# Patient Record
Sex: Female | Born: 1985 | Race: Black or African American | Hispanic: No | Marital: Single | Smoking: Former smoker
Health system: Southern US, Community
[De-identification: ages and names within clinical notes are randomized; demographics above are authoritative.]

## PROBLEM LIST (undated history)

## (undated) DIAGNOSIS — K589 Irritable bowel syndrome without diarrhea: Secondary | ICD-10-CM

## (undated) DIAGNOSIS — D649 Anemia, unspecified: Secondary | ICD-10-CM

## (undated) DIAGNOSIS — I10 Essential (primary) hypertension: Secondary | ICD-10-CM

## (undated) DIAGNOSIS — E119 Type 2 diabetes mellitus without complications: Secondary | ICD-10-CM

## (undated) DIAGNOSIS — O139 Gestational [pregnancy-induced] hypertension without significant proteinuria, unspecified trimester: Secondary | ICD-10-CM

## (undated) DIAGNOSIS — E7800 Pure hypercholesterolemia, unspecified: Secondary | ICD-10-CM

## (undated) DIAGNOSIS — M2550 Pain in unspecified joint: Secondary | ICD-10-CM

## (undated) DIAGNOSIS — R609 Edema, unspecified: Secondary | ICD-10-CM

## (undated) DIAGNOSIS — O10919 Unspecified pre-existing hypertension complicating pregnancy, unspecified trimester: Secondary | ICD-10-CM

## (undated) HISTORY — DX: Pain in unspecified joint: M25.50

## (undated) HISTORY — DX: Irritable bowel syndrome, unspecified: K58.9

## (undated) HISTORY — DX: Type 2 diabetes mellitus without complications: E11.9

## (undated) HISTORY — DX: Pure hypercholesterolemia, unspecified: E78.00

## (undated) HISTORY — DX: Edema, unspecified: R60.9

## (undated) HISTORY — DX: Unspecified pre-existing hypertension complicating pregnancy, unspecified trimester: O10.919

---
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
## 2005-08-22 ENCOUNTER — Inpatient Hospital Stay (HOSPITAL_COMMUNITY): Admission: AD | Admit: 2005-08-22 | Discharge: 2005-08-25 | Payer: Self-pay | Admitting: Obstetrics

## 2006-09-02 ENCOUNTER — Emergency Department (HOSPITAL_COMMUNITY): Admission: EM | Admit: 2006-09-02 | Discharge: 2006-09-02 | Payer: Self-pay | Admitting: Emergency Medicine

## 2007-02-11 ENCOUNTER — Emergency Department (HOSPITAL_COMMUNITY): Admission: EM | Admit: 2007-02-11 | Discharge: 2007-02-11 | Payer: Self-pay | Admitting: Emergency Medicine

## 2007-02-22 ENCOUNTER — Emergency Department (HOSPITAL_COMMUNITY): Admission: EM | Admit: 2007-02-22 | Discharge: 2007-02-22 | Payer: Self-pay | Admitting: Family Medicine

## 2007-11-02 ENCOUNTER — Inpatient Hospital Stay (HOSPITAL_COMMUNITY): Admission: AD | Admit: 2007-11-02 | Discharge: 2007-11-02 | Payer: Self-pay | Admitting: Obstetrics

## 2007-12-21 ENCOUNTER — Inpatient Hospital Stay (HOSPITAL_COMMUNITY): Admission: RE | Admit: 2007-12-21 | Discharge: 2007-12-23 | Payer: Self-pay | Admitting: Obstetrics

## 2008-04-20 ENCOUNTER — Emergency Department (HOSPITAL_COMMUNITY): Admission: EM | Admit: 2008-04-20 | Discharge: 2008-04-20 | Payer: Self-pay | Admitting: Emergency Medicine

## 2009-01-09 ENCOUNTER — Emergency Department (HOSPITAL_COMMUNITY): Admission: EM | Admit: 2009-01-09 | Discharge: 2009-01-09 | Payer: Self-pay | Admitting: Emergency Medicine

## 2009-10-02 ENCOUNTER — Emergency Department (HOSPITAL_COMMUNITY): Admission: EM | Admit: 2009-10-02 | Discharge: 2009-10-02 | Payer: Self-pay | Admitting: Emergency Medicine

## 2010-01-25 ENCOUNTER — Emergency Department (HOSPITAL_COMMUNITY): Admission: EM | Admit: 2010-01-25 | Discharge: 2010-01-26 | Payer: Self-pay | Admitting: Emergency Medicine

## 2010-05-02 ENCOUNTER — Emergency Department (HOSPITAL_COMMUNITY): Admission: EM | Admit: 2010-05-02 | Discharge: 2010-05-02 | Payer: Self-pay | Admitting: Family Medicine

## 2010-09-30 ENCOUNTER — Emergency Department (HOSPITAL_COMMUNITY)
Admission: EM | Admit: 2010-09-30 | Discharge: 2010-09-30 | Disposition: A | Discharge status: Home / Self Care | Payer: Self-pay | Attending: Emergency Medicine | Admitting: Emergency Medicine

## 2010-09-30 DIAGNOSIS — R110 Nausea: Secondary | ICD-10-CM | POA: Insufficient documentation

## 2010-09-30 DIAGNOSIS — K5289 Other specified noninfective gastroenteritis and colitis: Secondary | ICD-10-CM | POA: Insufficient documentation

## 2010-09-30 DIAGNOSIS — R197 Diarrhea, unspecified: Secondary | ICD-10-CM | POA: Insufficient documentation

## 2010-09-30 DIAGNOSIS — E669 Obesity, unspecified: Secondary | ICD-10-CM | POA: Insufficient documentation

## 2010-09-30 LAB — URINALYSIS, ROUTINE W REFLEX MICROSCOPIC
Protein, ur: 30 mg/dL — AB
Specific Gravity, Urine: 1.028 (ref 1.005–1.030)
Urine Glucose, Fasting: NEGATIVE mg/dL

## 2010-09-30 LAB — URINE MICROSCOPIC-ADD ON

## 2011-01-10 NOTE — Discharge Summary (Signed)
   [name]                          ACCOUNT NO.:  [id number]   MEDICAL RECORD NO.:  [id number]                   PATIENT TYPE:  INP   LOCATION:  9123                                 F[facility]ILITY:  [facility]   PHYSICIAN:  Burnadette Peter, M.D.             DATE OF BIRTH:  11/10/1985   DATE OF ADMISSION:  03/06/2003  DATE OF DISCHARGE:  03/08/2003                                 DISCHARGE SUMMARY   DISCHARGE DIAGNOSIS:  Pelvic inflammatory disease, resolving.   DISCHARGE MEDICATIONS:  1. Metronidazole 500 mg every 12 hours for 14 days.  2. Doxycycline 100 mg one tablet every 12 hours for 14 days.   FOLLOW UP:  GYN Clinic after three weeks.   HISTORY OF PRESENT ILLNESS:  This is a 25 year old G0 who presented with a  three day history of abdominal pain and __________.  The patient also had  classical __________   PHYSICAL EXAMINATION:  VITAL SIGNS:  Blood pressure 137/68, pulse rate  __________, respiratory rate __________, temperature 99.2.  LUNGS:  Lower lobe rales __________.  ABDOMEN:  Soft with no masses.  There was noted tenderness over the lower  quadrant.  No rebound __________.  PELVIC:  Moderate amount of white discharge.  Cervix tender on lateral  motion.  The uterus was tender to motion.  Adnexa bilateral mild tenderness.   ADMISSION DIAGNOSES:  1. Probable pelvic inflammatory disease.  2. Rule out pneumonia.   LABORATORY DATA:  __________   Chest x-ray normal.   __________ every 12 hours __________.  The patient was also consulted  regarding prevention of another episode of PID and was advised to think on  possible diaphragm contraception.     Anna Sabal, MD                         Burnadette Peter, M.D.    MC/MEDQ  D:  03/08/2003  T:  03/09/2003  Job:  [id number]

## 2011-01-10 NOTE — Discharge Summary (Signed)
[name], [name]                          ACCOUNT NO.:  [id number]   MEDICAL RECORD NO.:  [id number]                   PATIENT TYPE:  INP   LOCATION:  9123                                 [facility]  [facility]   PHYSICIAN:  Burnadette Peter, M.D.             DATE OF BIRTH:  [birth year]   DATE OF ADMISSION:  03/06/2003  DATE OF DISCHARGE:  03/08/2003                                 DISCHARGE SUMMARY   DISCHARGE DIAGNOSIS:  Pelvic inflammatory disease.   DISCHARGE MEDICATIONS:  1. Metronidazole 500 mg every 12 hours for 14 days.  2. Doxycycline 100 mg every 12 hours for 14 days.   FOLLOWUP DATE:  The patient will follow up at the GYN clinic after three  weeks.   ADMITTING HISTORY AND PHYSICAL EXAMINATION:  The patient is a 25 year old G0  with a 3 day history of abdominal pain.  No symptoms of nausea, vomiting,  constipation, diarrhea or anorexia.  The patient also had a low-grade fever.  The patient complained of a chronic cough for which she has used p.r.n.  cough medications.   PAST HISTORY:  Patient is newly gravid.   GYNECOLOGIC HISTORY:  The patient has regular menstrual cycles.  Last normal  menstrual period was February 21, 2003.   MEDICAL HISTORY:  Unremarkable.   SURGICAL HISTORY:  Unremarkable.   SOCIAL HISTORY:  The patient smokes one pack per day and admits to marijuana  use.   PHYSICAL EXAMINATION:  VITAL SIGNS:  On admission, blood pressure 137/68,  heart rate 93, respiratory rate 20, temperature 99.2.  T-max in [place]  ER was 100.2.  HEENT:  Unremarkable.  LUNGS:  Clear to auscultation.  There was auscultation of rales at the lower  left lobe.  CVS:  Regular, rate and rhythm.  No murmurs.  ABDOMEN:  Soft with no masses.  There was tenderness in both lower  quadrants.  No rebound tenderness.  PELVIC:  External genitalia - normal.  Vagina - moderate amount of white  discharge.  Cervix - tender with lateral motion.  Uterus - normal size with  tenderness on  motion.  Adnexa - bilateral fullness and mild tenderness.   ADMISSION LABORATORY:  Hemoglobin 11.4, WBC 29.4 with 93% polys, platelets  272,000.  Urinalysis protein 30, large blood, moderate leukocytes.  Glucose  88.  HCG negative.   CT consistent with bilateral tubal ovarian processes, can not rule out tubal  ovarian abscess.   ADMITTING DIAGNOSES:  1. Probable pelvic inflammatory disease.  2. Rule out pneumonia.   HOSPITAL COURSE:  The patient was started on Rocephin 1 gram IV every 24  hours and doxycycline 100 mg every 12 hours per R.N.  The patient continued  the antibiotics for 2 days with noted decreasing abdominal tenderness and  lysis of the fever.  On the second hospital day, the patient was sent home  with resolution of symptoms.  The patient will follow up at the GYN clinic after three weeks.     Anna Sabal, MD                         Burnadette Peter, M.D.    MC/MEDQ  D:  03/21/2003  T:  03/21/2003  Job:  [id number]

## 2011-05-19 LAB — URINALYSIS, ROUTINE W REFLEX MICROSCOPIC
Bilirubin Urine: NEGATIVE
Ketones, ur: NEGATIVE
Nitrite: NEGATIVE
Protein, ur: NEGATIVE
Urobilinogen, UA: 0.2

## 2011-05-20 LAB — CBC
HCT: 30.2 — ABNORMAL LOW
HCT: 34.7 — ABNORMAL LOW
Hemoglobin: 10.2 — ABNORMAL LOW
MCHC: 33.9
MCV: 91.3
RBC: 3.8 — ABNORMAL LOW
RDW: 13.3
WBC: 6.5

## 2011-12-05 ENCOUNTER — Encounter (HOSPITAL_COMMUNITY): Payer: Self-pay | Admitting: *Deleted

## 2011-12-05 ENCOUNTER — Emergency Department (HOSPITAL_COMMUNITY)
Admission: EM | Admit: 2011-12-05 | Discharge: 2011-12-05 | Disposition: A | Discharge status: Home / Self Care | Payer: Self-pay | Attending: Emergency Medicine | Admitting: Emergency Medicine

## 2011-12-05 DIAGNOSIS — N63 Unspecified lump in unspecified breast: Secondary | ICD-10-CM | POA: Insufficient documentation

## 2011-12-05 DIAGNOSIS — I10 Essential (primary) hypertension: Secondary | ICD-10-CM | POA: Insufficient documentation

## 2011-12-05 DIAGNOSIS — F17200 Nicotine dependence, unspecified, uncomplicated: Secondary | ICD-10-CM | POA: Insufficient documentation

## 2011-12-05 HISTORY — DX: Essential (primary) hypertension: I10

## 2011-12-05 NOTE — Discharge Instructions (Signed)
Your mass needs further evaluation. Try calling both surgery and breast center as referred on Monday. Return if enlarging in size and unable to follow up.

## 2011-12-05 NOTE — ED Notes (Signed)
The pt is  C/o a knot on her lt breast for 4 days.  No known injury

## 2011-12-05 NOTE — ED Notes (Signed)
States she has pain with palpation. No change in size noted

## 2011-12-05 NOTE — ED Provider Notes (Signed)
History     CSN: [id number]  Arrival date & time 12/05/11  1939   First MD Initiated Contact with Patient 12/05/11 2103      Chief Complaint  Patient presents with  . knot on  her lt breast     (Consider location/radiation/quality/duration/timing/severity/associated sxs/prior treatment) HPI Comments: Pt is a 26 yo female that presents with chief complaint of a "lump" to the breast. States noticed a firm lump in left breast about 3 days ago. It has not changed in size since then. It is not tender. There is no other nodules any where in her breasts. No nipple discharge. No doctor. Denies fever, chills, malaise.     The history is provided by the patient.    Past Medical History  Diagnosis Date  . Hypertension     History reviewed. No pertinent past surgical history.  No family history on file.  History  Substance Use Topics  . Smoking status: Current Everyday Smoker  . Smokeless tobacco: Not on file  . Alcohol Use: Yes    OB History    Grav Para Term Preterm Abortions TAB SAB Ect Mult Living                  Review of Systems  Constitutional: Negative for fever and chills.  Skin:       Breast nodule  All other systems reviewed and are negative.    Allergies  Review of patient's allergies indicates no known allergies.  Home Medications   Current Outpatient Rx  Name Route Sig Dispense Refill  . NYQUIL PO Oral Take 2 tablets by mouth at bedtime as needed. For insomnia      BP 132/78  Pulse 75  Temp(Src) 98.3 F (36.8 C) (Oral)  Resp 18  SpO2 99%  Physical Exam  Nursing note and vitals reviewed. Constitutional: She is oriented to person, place, and time. She appears well-developed and well-nourished. No distress.  HENT:  Head: Normocephalic.  Eyes: Conjunctivae are normal.  Neck: Neck supple.  Cardiovascular: Normal rate, regular rhythm and normal heart sounds.   Pulmonary/Chest: Effort normal and breath sounds normal. No respiratory distress.       Breasts are symmetrical bilaterally. There is a "peanut" shaped nodule in the left lateral breast, just anterior to the axilla. It is firm, smooth, mobile, non tender. NO axillary lymphadenopathy  Neurological: She is alert and oriented to person, place, and time.  Skin: Skin is warm and dry.  Psychiatric: She has a normal mood and affect.    ED Course  Procedures (including critical care time)  Left breast nodule. DDx abscess, cyst, benign or malignant mass. Doubt abscess since non tender, no redness, but instructed pt to return if growing in size fast and becomes tender. Will refer to surgery and breast center for further evaluation, i believe pt will need an ultrasound and possible biopsy.  1. Breast mass in female       MDM          Lottie Mussel, [place] 12/05/11 2153

## 2011-12-06 NOTE — ED Provider Notes (Signed)
Medical screening examination/treatment/procedure(s) were performed by non-physician practitioner and as supervising physician I was immediately available for consultation/collaboration.  Flint Melter, MD 12/06/11 [phone]

## 2012-01-06 ENCOUNTER — Ambulatory Visit: Payer: Self-pay

## 2012-01-07 ENCOUNTER — Encounter: Payer: Self-pay | Admitting: Obstetrics and Gynecology

## 2013-10-16 ENCOUNTER — Encounter (HOSPITAL_COMMUNITY): Payer: Self-pay | Admitting: Emergency Medicine

## 2013-10-16 ENCOUNTER — Emergency Department (HOSPITAL_COMMUNITY)
Admission: EM | Admit: 2013-10-16 | Discharge: 2013-10-16 | Disposition: A | Discharge status: Home / Self Care | Payer: Self-pay | Attending: Emergency Medicine | Admitting: Emergency Medicine

## 2013-10-16 DIAGNOSIS — I10 Essential (primary) hypertension: Secondary | ICD-10-CM | POA: Insufficient documentation

## 2013-10-16 DIAGNOSIS — R0789 Other chest pain: Secondary | ICD-10-CM | POA: Insufficient documentation

## 2013-10-16 DIAGNOSIS — Z87891 Personal history of nicotine dependence: Secondary | ICD-10-CM | POA: Insufficient documentation

## 2013-10-16 DIAGNOSIS — J069 Acute upper respiratory infection, unspecified: Secondary | ICD-10-CM | POA: Insufficient documentation

## 2013-10-16 MED ORDER — IBUPROFEN 400 MG PO TABS
800.0000 mg | ORAL_TABLET | Freq: Once | ORAL | Status: AC
  Administered 2013-10-16: 800 mg via ORAL
  Filled 2013-10-16: qty 2

## 2013-10-16 MED ORDER — AZITHROMYCIN 250 MG PO TABS: 250.0000 mg | ORAL_TABLET | Freq: Every day | ORAL | Status: DC

## 2013-10-16 MED ORDER — ACETAMINOPHEN 325 MG PO TABS
650.0000 mg | ORAL_TABLET | Freq: Once | ORAL | Status: AC
  Administered 2013-10-16: 650 mg via ORAL
  Filled 2013-10-16: qty 2

## 2013-10-16 NOTE — ED Provider Notes (Signed)
Medical screening examination/treatment/procedure(s) were performed by non-physician practitioner and as supervising physician I was immediately available for consultation/collaboration.  EKG Interpretation   None       Devoria AlbeIva Cherysh Epperly, MD, Armando GangFACEP   Ward GivensIva L Lareen Mullings, MD 10/16/13 [phone]

## 2013-10-16 NOTE — Discharge Instructions (Signed)

## 2013-10-16 NOTE — ED Provider Notes (Signed)
CSN: [id number]     Arrival date & time 10/16/13  1914 History  This chart was scribed for non-physician practitioner Marlon Pel working with Ward Givens, MD by Carl Best, ED Scribe. This patient was seen in room TR05C/TR05C and the patient's care was started at 7:56 PM.     Chief Complaint  Patient presents with  . URI     Patient is a 28 y.o. female presenting with URI. The history is provided by the patient. No language interpreter was used.  URI Presenting symptoms: cough, rhinorrhea and sore throat   Presenting symptoms: no ear pain and no fever   Associated symptoms: headaches   Associated symptoms: no myalgias    HPI Comments: [name] is a 28 y.o. female who presents to the Emergency Department complaining of constant cough, rhinorrhea, sore throat, and headache that started 11 days ago after the patient got off work.  The patient denies ear pain, fever, nausea, vomiting, myalgias, and diarrhea as associated symptoms.  She states that she will experience chest pain when coughing.  The patient states that she has tried Tylenol a week ago with no relief to her headaches.  The patient denies having a history of Asthma, Hypertension, and DM.  She states that she is normally healthy otherwise.     Past Medical History  Diagnosis Date  . Hypertension    History reviewed. No pertinent past surgical history. No family history on file. History  Substance Use Topics  . Smoking status: Former Smoker    Quit date: 03/29/2013  . Smokeless tobacco: Not on file  . Alcohol Use: Yes   OB History   Grav Para Term Preterm Abortions TAB SAB Ect Mult Living                 Review of Systems  Constitutional: Negative for fever.  HENT: Positive for rhinorrhea and sore throat. Negative for ear pain.   Respiratory: Positive for cough.   Cardiovascular: Positive for chest pain (when coughing).  Gastrointestinal: Negative for nausea, vomiting and diarrhea.  Musculoskeletal:  Negative for myalgias.  Neurological: Positive for headaches.  All other systems reviewed and are negative.      Allergies  Review of patient's allergies indicates no known allergies.  Home Medications   Current Outpatient Rx  Name  Route  Sig  Dispense  Refill  . Pseudoeph-Doxylamine-DM-APAP (NYQUIL PO)   Oral   Take 2 tablets by mouth at bedtime as needed. For insomnia         . azithromycin (ZITHROMAX) 250 MG tablet   Oral   Take 1 tablet (250 mg total) by mouth daily. Take first 2 tablets together, then 1 every day until finished.   6 tablet   0    Triage Vitals: BP 135/80  Pulse 97  Temp(Src) 97.4 F (36.3 C) (Oral)  Resp 15  Ht 5\' 5"  (1.651 m)  Wt 205 lb (92.987 kg)  BMI 34.11 kg/m2  SpO2 97%  LMP 09/06/2013  Physical Exam  Nursing note and vitals reviewed. Constitutional: She is oriented to person, place, and time. She appears well-developed and well-nourished.  HENT:  Head: Normocephalic and atraumatic.  Eyes: EOM are normal.  Neck: Normal range of motion.  Cardiovascular: Normal rate.   Pulmonary/Chest: Effort normal. No respiratory distress. She has no wheezes.  Musculoskeletal: Normal range of motion.  Neurological: She is alert and oriented to person, place, and time.  Skin: Skin is warm and dry.  Psychiatric:  She has a normal mood and affect. Her behavior is normal.    ED Course  Procedures (including critical care time)  DIAGNOSTIC STUDIES: Oxygen Saturation is 97% on room air, adequate by my interpretation.    COORDINATION OF CARE: 7:59 PM- Discussed discharging the patient with a prescription for antibiotics and a note excusing the patient from work until Thursday.  Advised the patient to alternate between Ibuprofen and Tylenol to treat her headache.  The patient agreed to the treatment plan.     Labs Review Labs Reviewed - No data to display Imaging Review No results found.  EKG Interpretation   None       MDM   Final  diagnoses:  URI (upper respiratory infection)    27 y.o.[name]'s evaluation in the Emergency Department is complete. It has been determined that no acute conditions requiring further emergency intervention are present at this time. The patient/guardian have been advised of the diagnosis and plan. We have discussed signs and symptoms that warrant return to the ED, such as changes or worsening in symptoms.  Vital signs are stable at discharge. Filed Vitals:   10/16/13 1919  BP: 135/80  Pulse: 97  Temp: 97.4 F (36.3 C)  Resp: 15    Patient/guardian has voiced understanding and agreed to follow-up with the PCP or specialist.  I personally performed the services described in this documentation, which was scribed in my presence. The recorded information has been reviewed and is accurate.    Dorthula Matasiffany G Azaryah Heathcock, PA-C 10/16/13 2018

## 2013-10-16 NOTE — ED Notes (Signed)
C/o cold sx, onset Thursday, mentions cough, HA, nasal & chest, congestion. HA 10/10. No relief with nyquil. Mentions needing a doctors note for work.

## 2014-10-01 ENCOUNTER — Encounter (HOSPITAL_COMMUNITY): Payer: Self-pay | Admitting: *Deleted

## 2014-10-01 ENCOUNTER — Emergency Department (INDEPENDENT_AMBULATORY_CARE_PROVIDER_SITE_OTHER)
Admission: EM | Admit: 2014-10-01 | Discharge: 2014-10-01 | Disposition: A | Discharge status: Home / Self Care | Payer: Self-pay | Source: Home / Self Care | Attending: Emergency Medicine | Admitting: Emergency Medicine

## 2014-10-01 DIAGNOSIS — I10 Essential (primary) hypertension: Secondary | ICD-10-CM

## 2014-10-01 LAB — POCT I-STAT, CHEM 8
BUN: 14 mg/dL (ref 6–23)
CALCIUM ION: 1.14 mmol/L (ref 1.12–1.23)
Chloride: 102 mmol/L (ref 96–112)
Creatinine, Ser: 0.8 mg/dL (ref 0.50–1.10)
Glucose, Bld: 85 mg/dL (ref 70–99)
HCT: 37 % (ref 36.0–46.0)
Hemoglobin: 12.6 g/dL (ref 12.0–15.0)
Potassium: 3.7 mmol/L (ref 3.5–5.1)
SODIUM: 141 mmol/L (ref 135–145)
TCO2: 23 mmol/L (ref 0–100)

## 2014-10-01 MED ORDER — AMLODIPINE BESYLATE 10 MG PO TABS: 10.0000 mg | ORAL_TABLET | Freq: Every day | ORAL | Status: DC

## 2014-10-01 NOTE — ED Provider Notes (Signed)
CSN: [id number]     Arrival date & time 10/01/14  1522 [id number]   First MD Initiated Contact with Patient 10/01/14 1528     Chief Complaint  Patient presents with  . Hypertension   (Consider location/radiation/quality/duration/timing/severity/associated sxs/prior Treatment) HPI She is a 29 year old woman here for evaluation of elevated blood pressures. She states 9 years ago she had pregnancy-induced hypertension, but has not had any problems since then. She has been taking her blood pressure at home and it has been in the 140s over 90s consistently. She denies any chest pain or shortness of breath. She does describe some intermittent headaches and dizziness as well as some intermittent spots in her vision.  Past Medical History  Diagnosis Date  . Hypertension    History reviewed. No pertinent past surgical history. History reviewed. No pertinent family history. History  Substance Use Topics  . Smoking status: Former Smoker    Quit date: 03/29/2013  . Smokeless tobacco: Not on file  . Alcohol Use: Yes   OB History    No data available     Review of Systems  Eyes: Positive for visual disturbance.  Respiratory: Negative for shortness of breath.   Cardiovascular: Negative for chest pain.  Neurological: Positive for dizziness and headaches.    Allergies  Review of patient's allergies indicates no known allergies.  Home Medications   Prior to Admission medications   Medication Sig Start Date End Date Taking? Authorizing Provider  amLODipine (NORVASC) 10 MG tablet Take 1 tablet (10 mg total) by mouth daily. 10/01/14   Charm RingsErin J Honig, MD  azithromycin (ZITHROMAX) 250 MG tablet Take 1 tablet (250 mg total) by mouth daily. Take first 2 tablets together, then 1 every day until finished. 10/16/13   Tiffany Irine SealG Greene, PA-C  Pseudoeph-Doxylamine-DM-APAP (NYQUIL PO) Take 2 tablets by mouth at bedtime as needed. For insomnia    Historical Provider, MD   BP 144/98 mmHg  Pulse 77  Temp(Src)  98.5 F (36.9 C) (Oral)  Resp 18  SpO2 98%  LMP 09/29/2014 Physical Exam  Constitutional: She is oriented to person, place, and time. She appears well-developed and well-nourished. No distress.  Cardiovascular: Normal rate, regular rhythm and normal heart sounds.   No murmur heard. Pulmonary/Chest: Effort normal and breath sounds normal. No respiratory distress. She has no wheezes. She has no rales.  Neurological: She is alert and oriented to person, place, and time.    ED Course  Procedures (including critical care time) Labs Review Labs Reviewed  POCT I-STAT, CHEM 8    Imaging Review No results found.   MDM   1. Essential hypertension    I-STAT is normal. We'll start amlodipine 10 mg daily. She will monitor her blood pressure at home. Follow-up if it remains over 140/90. Reviewed reasons to go the ER as in after visit summary.    Charm RingsErin J Honig, MD 10/01/14 [phone]

## 2014-10-01 NOTE — ED Notes (Signed)
Pt     Reports          Her  Blood   Pressure       Was      High  sev  Weeks  Ago     And  She checked  It   Herself  At  Home      And it  Was  High  As  Well      She  denys    Any     Symptoms  Other  Than being  Dizzy

## 2014-10-01 NOTE — Discharge Instructions (Signed)
You have high blood pressure. Your blood work is normal. Start taking amlodipine 1 pill daily. Check your blood pressure at home over the next 2 weeks. As long as your numbers are less than 140/90, continue taking the amlodipine. If your blood pressure remains over 140/90, please come back. If you develop chest pain or severe headache that does not improve with Tylenol, please go to the emergency room.

## 2015-05-04 LAB — OB RESULTS CONSOLE ABO/RH: RH Type: POSITIVE

## 2015-05-04 LAB — OB RESULTS CONSOLE ANTIBODY SCREEN: ANTIBODY SCREEN: NEGATIVE

## 2015-05-04 LAB — OB RESULTS CONSOLE HIV ANTIBODY (ROUTINE TESTING): HIV: NONREACTIVE

## 2015-05-04 LAB — OB RESULTS CONSOLE GC/CHLAMYDIA
Chlamydia: NEGATIVE
GC PROBE AMP, GENITAL: NEGATIVE

## 2015-05-04 LAB — OB RESULTS CONSOLE RPR: RPR: NONREACTIVE

## 2015-05-04 LAB — OB RESULTS CONSOLE RUBELLA ANTIBODY, IGM: Rubella: IMMUNE

## 2015-05-04 LAB — OB RESULTS CONSOLE HEPATITIS B SURFACE ANTIGEN: Hepatitis B Surface Ag: NEGATIVE

## 2015-08-26 NOTE — L&D Delivery Note (Signed)
Delivery Note At 10:45 AM a viable female was delivered via Vaginal, Spontaneous Delivery (Presentation: Left Occiput Anterior).  APGAR: , ; weight  .   Placenta status: Intact, Spontaneous.  Cord: 3 vessels with the following complications: None.  Cord pH: not done  Anesthesia: Epidural  Episiotomy:   Lacerations: Labialleft labial tear repaired with one stitch Suture Repair: 2.0 vicryl Est. Blood Loss (mL): 150  Mom to postpartum.  Baby to Couplet care / Skin to Skin.  Rafaelita Foister A 01/01/2016, 10:56 AM

## 2015-11-22 LAB — OB RESULTS CONSOLE GBS: STREP GROUP B AG: NEGATIVE

## 2015-12-31 ENCOUNTER — Encounter (HOSPITAL_COMMUNITY): Payer: Self-pay | Admitting: *Deleted

## 2015-12-31 ENCOUNTER — Inpatient Hospital Stay (HOSPITAL_COMMUNITY): Payer: Medicaid Other

## 2015-12-31 ENCOUNTER — Inpatient Hospital Stay (HOSPITAL_COMMUNITY)
Admission: AD | Admit: 2015-12-31 | Discharge: 2016-01-03 | DRG: 775 | Disposition: A | Discharge status: Home / Self Care | Payer: Medicaid Other | Source: Ambulatory Visit | Attending: Obstetrics | Admitting: Obstetrics

## 2015-12-31 DIAGNOSIS — Z8249 Family history of ischemic heart disease and other diseases of the circulatory system: Secondary | ICD-10-CM | POA: Diagnosis not present

## 2015-12-31 DIAGNOSIS — O288 Other abnormal findings on antenatal screening of mother: Secondary | ICD-10-CM

## 2015-12-31 DIAGNOSIS — O114 Pre-existing hypertension with pre-eclampsia, complicating childbirth: Principal | ICD-10-CM | POA: Diagnosis present

## 2015-12-31 DIAGNOSIS — O9902 Anemia complicating childbirth: Secondary | ICD-10-CM | POA: Diagnosis present

## 2015-12-31 DIAGNOSIS — Z87891 Personal history of nicotine dependence: Secondary | ICD-10-CM

## 2015-12-31 DIAGNOSIS — D509 Iron deficiency anemia, unspecified: Secondary | ICD-10-CM | POA: Diagnosis present

## 2015-12-31 DIAGNOSIS — Z3A40 40 weeks gestation of pregnancy: Secondary | ICD-10-CM

## 2015-12-31 DIAGNOSIS — O480 Post-term pregnancy: Secondary | ICD-10-CM

## 2015-12-31 DIAGNOSIS — O99213 Obesity complicating pregnancy, third trimester: Secondary | ICD-10-CM

## 2015-12-31 DIAGNOSIS — O10919 Unspecified pre-existing hypertension complicating pregnancy, unspecified trimester: Secondary | ICD-10-CM

## 2015-12-31 HISTORY — DX: Anemia, unspecified: D64.9

## 2015-12-31 LAB — COMPREHENSIVE METABOLIC PANEL
ALBUMIN: 2.7 g/dL — AB (ref 3.5–5.0)
ALT: 10 U/L — AB (ref 14–54)
AST: 18 U/L (ref 15–41)
Alkaline Phosphatase: 133 U/L — ABNORMAL HIGH (ref 38–126)
Anion gap: 8 (ref 5–15)
CHLORIDE: 108 mmol/L (ref 101–111)
CO2: 21 mmol/L — AB (ref 22–32)
CREATININE: 0.53 mg/dL (ref 0.44–1.00)
Calcium: 8.2 mg/dL — ABNORMAL LOW (ref 8.9–10.3)
GFR calc Af Amer: >60 mL/min (ref >60)
GFR calc non Af Amer: >60 mL/min (ref >60)
Glucose, Bld: 107 mg/dL — ABNORMAL HIGH (ref 65–99)
Potassium: 2.9 mmol/L — ABNORMAL LOW (ref 3.5–5.1)
SODIUM: 137 mmol/L (ref 135–145)
Total Bilirubin: 0.8 mg/dL (ref 0.3–1.2)
Total Protein: 6.3 g/dL — ABNORMAL LOW (ref 6.5–8.1)

## 2015-12-31 LAB — CBC
HCT: 27.6 % — ABNORMAL LOW (ref 36.0–46.0)
Hemoglobin: 9.1 g/dL — ABNORMAL LOW (ref 12.0–15.0)
MCH: 28.1 pg (ref 26.0–34.0)
MCHC: 33 g/dL (ref 30.0–36.0)
MCV: 85.2 fL (ref 78.0–100.0)
PLATELETS: 253 10*3/uL (ref 150–400)
RBC: 3.24 MIL/uL — AB (ref 3.87–5.11)
RDW: 15 % (ref 11.5–15.5)
WBC: 8.1 10*3/uL (ref 4.0–10.5)

## 2015-12-31 LAB — PROTEIN / CREATININE RATIO, URINE
Creatinine, Urine: 67 mg/dL
PROTEIN CREATININE RATIO: 0.3 mg/mg{creat} — AB (ref 0.00–0.15)
TOTAL PROTEIN, URINE: 20 mg/dL

## 2015-12-31 LAB — TYPE AND SCREEN
ABO/RH(D): O POS
ANTIBODY SCREEN: NEGATIVE

## 2015-12-31 MED ORDER — OXYTOCIN BOLUS FROM INFUSION: 500.0000 mL | INTRAVENOUS | Status: DC

## 2015-12-31 MED ORDER — TERBUTALINE SULFATE 1 MG/ML IJ SOLN: 0.2500 mg | Freq: Once | INTRAMUSCULAR | Status: DC | PRN

## 2015-12-31 MED ORDER — MAGNESIUM SULFATE BOLUS VIA INFUSION
4.0000 g | Freq: Once | INTRAVENOUS | Status: AC
  Administered 2015-12-31: 4 g via INTRAVENOUS
  Filled 2015-12-31: qty 500

## 2015-12-31 MED ORDER — OXYCODONE-ACETAMINOPHEN 5-325 MG PO TABS: 2.0000 | ORAL_TABLET | ORAL | Status: DC | PRN

## 2015-12-31 MED ORDER — LACTATED RINGERS IV SOLN: 500.0000 mL | INTRAVENOUS | Status: DC | PRN

## 2015-12-31 MED ORDER — FLEET ENEMA 7-19 GM/118ML RE ENEM: 1.0000 | ENEMA | RECTAL | Status: DC | PRN

## 2015-12-31 MED ORDER — OXYTOCIN 10 UNIT/ML IJ SOLN
2.5000 [IU]/h | INTRAVENOUS | Status: DC
  Filled 2015-12-31 (×2): qty 4

## 2015-12-31 MED ORDER — LIDOCAINE HCL (PF) 1 % IJ SOLN
30.0000 mL | INTRAMUSCULAR | Status: DC | PRN
  Filled 2015-12-31: qty 30

## 2015-12-31 MED ORDER — CITRIC ACID-SODIUM CITRATE 334-500 MG/5ML PO SOLN: 30.0000 mL | ORAL | Status: DC | PRN

## 2015-12-31 MED ORDER — MAGNESIUM SULFATE 50 % IJ SOLN
2.0000 g/h | INTRAMUSCULAR | Status: DC
  Filled 2015-12-31: qty 80

## 2015-12-31 MED ORDER — ONDANSETRON HCL 4 MG/2ML IJ SOLN: 4.0000 mg | Freq: Four times a day (QID) | INTRAMUSCULAR | Status: DC | PRN

## 2015-12-31 MED ORDER — MISOPROSTOL 25 MCG QUARTER TABLET
25.0000 ug | ORAL_TABLET | ORAL | Status: DC | PRN
  Administered 2015-12-31 – 2016-01-01 (×2): 25 ug via VAGINAL
  Filled 2015-12-31 (×2): qty 0.25

## 2015-12-31 MED ORDER — LACTATED RINGERS IV SOLN
INTRAVENOUS | Status: DC
  Administered 2015-12-31 – 2016-01-01 (×3): via INTRAVENOUS

## 2015-12-31 MED ORDER — ACETAMINOPHEN 325 MG PO TABS: 650.0000 mg | ORAL_TABLET | ORAL | Status: DC | PRN

## 2015-12-31 MED ORDER — OXYCODONE-ACETAMINOPHEN 5-325 MG PO TABS: 1.0000 | ORAL_TABLET | ORAL | Status: DC | PRN

## 2015-12-31 NOTE — H&P (Signed)
[name] is a 30 y.o. female presenting for cramping. Maternal Medical History:  Reason for admission: Presents with cramping on exam BP noted to be elevated.  Denies HA or visual changes.  Fetal activity: Perceived fetal activity is normal.   Last perceived fetal movement was within the past hour.    Prenatal complications: no prenatal complications Prenatal Complications - Diabetes: none.    OB History    Gravida Para Term Preterm AB TAB SAB Ectopic Multiple Living   3         2     Past Medical History  Diagnosis Date  . Hypertension   . Anemia    Past Surgical History  Procedure Laterality Date  . No past surgeries     Family History: family history includes Hypertension in her mother. Social History:  reports that she quit smoking about 2 years ago. She does not have any smokeless tobacco history on file. She reports that she drinks alcohol. She reports that she does not use illicit drugs.   Prenatal Transfer Tool  Maternal Diabetes: No Genetic Screening: Normal Maternal Ultrasounds/Referrals: Normal Fetal Ultrasounds or other Referrals:  None Maternal Substance Abuse:  No Significant Maternal Medications:  None Significant Maternal Lab Results:  None Other Comments:  None  Review of Systems  All other systems reviewed and are negative.   Dilation:  (outer 3 funnel to close) Effacement (%): Thick Exam by:: Sarajane MarekBeth McNulty, RN  Blood pressure 144/86, pulse 87, temperature 98.7 F (37.1 C), temperature source Oral, resp. rate 18, height 5\' 2"  (1.575 m), weight 222 lb (100.699 kg). Maternal Exam:  Abdomen: Patient reports no abdominal tenderness. Fetal presentation: vertex  Cervix: Cervix evaluated by digital exam.     Physical Exam  Nursing note and vitals reviewed. Constitutional: She is oriented to person, place, and time. She appears well-developed and well-nourished.  HENT:  Head: Normocephalic and atraumatic.  Eyes: Conjunctivae are normal.  Pupils are equal, round, and reactive to light.  Neck: Normal range of motion. Neck supple.  Cardiovascular: Normal rate and regular rhythm.   Respiratory: Effort normal and breath sounds normal.  GI: Soft. Bowel sounds are normal.  Genitourinary: Vagina normal and uterus normal.  Musculoskeletal: Normal range of motion.  Neurological: She is alert and oriented to person, place, and time.  Skin: Skin is warm and dry.  Psychiatric: She has a normal mood and affect. Her behavior is normal. Judgment and thought content normal.    Prenatal labs: ABO, Rh: --/--/O POS (05/08 1841) Antibody: PENDING (05/08 1841) Rubella: Immune (09/09 0000) RPR: Nonreactive (09/09 0000)  HBsAg: Negative (09/09 0000)  HIV: Non-reactive (09/09 0000)  GBS: Negative (03/30 0000)   Assessment/Plan: 40.2 weeks.  Elevated BP.  Admit for 2 stage IOL.   Ziad Maye A 12/31/2015, 9:25 PM

## 2015-12-31 NOTE — MAU Note (Signed)
Pt. States she has been having period like cramps for a few days. Noticed some bleeding a few days ago when wiping. Denies LOF. Pt. States baby is moving well. Pt. Scheduled to see Dr. Gaynell FaceMarshall on Wednesday.

## 2015-12-31 NOTE — Anesthesia Pain Management Evaluation Note (Signed)
  CRNA Pain Management Visit Note  Patient: [name], 30 y.o., female  "Hello I am a [facility] of the anesthesia [facility] at [facility]. We have an anesthesia team available at all times to provide care throughout the hospital, including epidural management and anesthesia for C-section. I don't know your plan for the delivery whether it a natural birth, water birth, IV sedation, nitrous supplementation, doula or epidural, but we want to meet your pain goals."   1.Was your pain managed to your expectations on prior hospitalizations?   Yes   2.What is your expectation for pain management during this hospitalization?     Epidural  3.How can we help you reach that goal? epidural  Record the patient's initial score and the patient's pain goal.   Pain: 0  Pain Goal: 4 [facility] wants you to be able to say your pain was always managed very well.  Ever Halberg 12/31/2015

## 2016-01-01 ENCOUNTER — Inpatient Hospital Stay (HOSPITAL_COMMUNITY): Payer: Medicaid Other | Admitting: Anesthesiology

## 2016-01-01 ENCOUNTER — Encounter (HOSPITAL_COMMUNITY): Payer: Self-pay | Admitting: *Deleted

## 2016-01-01 LAB — CBC
HCT: 29.8 % — ABNORMAL LOW (ref 36.0–46.0)
HCT: 28.6 % — ABNORMAL LOW (ref 36.0–46.0)
HEMOGLOBIN: 9.9 g/dL — AB (ref 12.0–15.0)
Hemoglobin: 9.4 g/dL — ABNORMAL LOW (ref 12.0–15.0)
MCH: 28.3 pg (ref 26.0–34.0)
MCH: 28.1 pg (ref 26.0–34.0)
MCHC: 33.2 g/dL (ref 30.0–36.0)
MCHC: 32.9 g/dL (ref 30.0–36.0)
MCV: 85.1 fL (ref 78.0–100.0)
MCV: 85.4 fL (ref 78.0–100.0)
PLATELETS: 262 10*3/uL (ref 150–400)
Platelets: 278 10*3/uL (ref 150–400)
RBC: 3.5 MIL/uL — ABNORMAL LOW (ref 3.87–5.11)
RBC: 3.35 MIL/uL — AB (ref 3.87–5.11)
RDW: 15.1 % (ref 11.5–15.5)
RDW: 15.2 % (ref 11.5–15.5)
WBC: 8.7 10*3/uL (ref 4.0–10.5)
WBC: 8.9 10*3/uL (ref 4.0–10.5)

## 2016-01-01 LAB — ABO/RH: ABO/RH(D): O POS

## 2016-01-01 LAB — RPR: RPR: NONREACTIVE

## 2016-01-01 MED ORDER — PRENATAL MULTIVITAMIN CH
1.0000 | ORAL_TABLET | Freq: Every day | ORAL | Status: DC
  Administered 2016-01-01 – 2016-01-03 (×3): 1 via ORAL
  Filled 2016-01-01 (×3): qty 1

## 2016-01-01 MED ORDER — LACTATED RINGERS IV SOLN
500.0000 mL | Freq: Once | INTRAVENOUS | Status: AC
  Administered 2016-01-01: 500 mL via INTRAVENOUS

## 2016-01-01 MED ORDER — TETANUS-DIPHTH-ACELL PERTUSSIS 5-2.5-18.5 LF-MCG/0.5 IM SUSP
0.5000 mL | Freq: Once | INTRAMUSCULAR | Status: AC
  Administered 2016-01-02: 0.5 mL via INTRAMUSCULAR
  Filled 2016-01-01 (×2): qty 0.5

## 2016-01-01 MED ORDER — LACTATED RINGERS IV SOLN
1.0000 m[IU]/min | INTRAVENOUS | Status: DC
  Administered 2016-01-01: 2 m[IU]/min via INTRAVENOUS

## 2016-01-01 MED ORDER — LIDOCAINE HCL (PF) 1 % IJ SOLN
INTRAMUSCULAR | Status: DC | PRN
  Administered 2016-01-01 (×2): 4 mL

## 2016-01-01 MED ORDER — SIMETHICONE 80 MG PO CHEW: 80.0000 mg | CHEWABLE_TABLET | ORAL | Status: DC | PRN

## 2016-01-01 MED ORDER — PHENYLEPHRINE 40 MCG/ML (10ML) SYRINGE FOR IV PUSH (FOR BLOOD PRESSURE SUPPORT)
80.0000 ug | PREFILLED_SYRINGE | INTRAVENOUS | Status: DC | PRN
  Filled 2016-01-01: qty 10

## 2016-01-01 MED ORDER — ACETAMINOPHEN 325 MG PO TABS: 650.0000 mg | ORAL_TABLET | ORAL | Status: DC | PRN

## 2016-01-01 MED ORDER — FENTANYL 2.5 MCG/ML BUPIVACAINE 1/10 % EPIDURAL INFUSION (WH - ANES)
14.0000 mL/h | INTRAMUSCULAR | Status: DC | PRN
  Administered 2016-01-01: 14 mL/h via EPIDURAL
  Filled 2016-01-01: qty 125

## 2016-01-01 MED ORDER — IBUPROFEN 600 MG PO TABS
600.0000 mg | ORAL_TABLET | Freq: Four times a day (QID) | ORAL | Status: DC
  Administered 2016-01-01 – 2016-01-03 (×8): 600 mg via ORAL
  Filled 2016-01-01 (×8): qty 1

## 2016-01-01 MED ORDER — FERROUS SULFATE 325 (65 FE) MG PO TABS
325.0000 mg | ORAL_TABLET | Freq: Two times a day (BID) | ORAL | Status: DC
  Administered 2016-01-01 – 2016-01-03 (×4): 325 mg via ORAL
  Filled 2016-01-01 (×6): qty 1

## 2016-01-01 MED ORDER — FENTANYL 2.5 MCG/ML BUPIVACAINE 1/10 % EPIDURAL INFUSION (WH - ANES)
INTRAMUSCULAR | Status: DC | PRN
  Administered 2016-01-01: 14 mL/h via EPIDURAL

## 2016-01-01 MED ORDER — EPHEDRINE 5 MG/ML INJ: 10.0000 mg | INTRAVENOUS | Status: DC | PRN

## 2016-01-01 MED ORDER — MAGNESIUM SULFATE 50 % IJ SOLN
2.0000 g/h | INTRAVENOUS | Status: AC
  Administered 2016-01-01: 2 g/h via INTRAVENOUS
  Filled 2016-01-01: qty 80

## 2016-01-01 MED ORDER — OXYCODONE-ACETAMINOPHEN 5-325 MG PO TABS
1.0000 | ORAL_TABLET | ORAL | Status: DC | PRN
  Administered 2016-01-01 – 2016-01-02 (×2): 1 via ORAL
  Filled 2016-01-01 (×2): qty 1

## 2016-01-01 MED ORDER — COCONUT OIL OIL
1.0000 "application " | TOPICAL_OIL | Status: DC | PRN
  Filled 2016-01-01: qty 120

## 2016-01-01 MED ORDER — LACTATED RINGERS IV SOLN
INTRAVENOUS | Status: DC
  Administered 2016-01-02: via INTRAVENOUS

## 2016-01-01 MED ORDER — DIPHENHYDRAMINE HCL 25 MG PO CAPS
25.0000 mg | ORAL_CAPSULE | Freq: Four times a day (QID) | ORAL | Status: DC | PRN
Start: 2016-01-01 — End: 2016-01-03

## 2016-01-01 MED ORDER — BUTORPHANOL TARTRATE 1 MG/ML IJ SOLN: 2.0000 mg | INTRAMUSCULAR | Status: DC | PRN

## 2016-01-01 MED ORDER — BENZOCAINE-MENTHOL 20-0.5 % EX AERO
1.0000 "application " | INHALATION_SPRAY | CUTANEOUS | Status: DC | PRN
  Administered 2016-01-01 (×2): 1 via TOPICAL
  Filled 2016-01-01 (×2): qty 56

## 2016-01-01 MED ORDER — ONDANSETRON HCL 4 MG PO TABS: 4.0000 mg | ORAL_TABLET | ORAL | Status: DC | PRN

## 2016-01-01 MED ORDER — PHENYLEPHRINE 40 MCG/ML (10ML) SYRINGE FOR IV PUSH (FOR BLOOD PRESSURE SUPPORT): 80.0000 ug | PREFILLED_SYRINGE | INTRAVENOUS | Status: DC | PRN

## 2016-01-01 MED ORDER — DIBUCAINE 1 % RE OINT
1.0000 "application " | TOPICAL_OINTMENT | RECTAL | Status: DC | PRN
  Filled 2016-01-01: qty 28.4

## 2016-01-01 MED ORDER — ONDANSETRON HCL 4 MG/2ML IJ SOLN: 4.0000 mg | INTRAMUSCULAR | Status: DC | PRN

## 2016-01-01 MED ORDER — ZOLPIDEM TARTRATE 5 MG PO TABS: 5.0000 mg | ORAL_TABLET | Freq: Every evening | ORAL | Status: DC | PRN

## 2016-01-01 MED ORDER — DIPHENHYDRAMINE HCL 50 MG/ML IJ SOLN: 12.5000 mg | INTRAMUSCULAR | Status: DC | PRN

## 2016-01-01 MED ORDER — SENNOSIDES-DOCUSATE SODIUM 8.6-50 MG PO TABS
2.0000 | ORAL_TABLET | ORAL | Status: DC
  Administered 2016-01-01: 2 via ORAL
  Filled 2016-01-01: qty 2

## 2016-01-01 MED ORDER — WITCH HAZEL-GLYCERIN EX PADS: 1.0000 "application " | MEDICATED_PAD | CUTANEOUS | Status: DC | PRN

## 2016-01-01 NOTE — Progress Notes (Signed)
Patient ID: [name], female   DOB: 08-04-1986, 30 y.o.   MRN: [id number] Blood pressure 145/91 and she is on magnesium sulfate cervix is 4 cm 70% vertex -2-3 amniotomy performed the fluids clear and she is on Pitocin

## 2016-01-01 NOTE — Anesthesia Preprocedure Evaluation (Signed)
Anesthesia Evaluation  Patient identified by MRN, date of birth, ID band Patient awake    Reviewed: Allergy & Precautions, NPO status , Patient's Chart, lab work & pertinent test results  History of Anesthesia Complications Negative for: history of anesthetic complications  Airway Mallampati: III  TM Distance: >3 FB Neck ROM: Full    Dental no notable dental hx. (+) Dental Advisory Given   Pulmonary former smoker,    Pulmonary exam normal breath sounds clear to auscultation       Cardiovascular hypertension (PreE on mag), Normal cardiovascular exam Rhythm:Regular Rate:Normal     Neuro/Psych negative neurological ROS  negative psych ROS   GI/Hepatic negative GI ROS, Neg liver ROS,   Endo/Other  Morbid obesity  Renal/GU negative Renal ROS  negative genitourinary   Musculoskeletal negative musculoskeletal ROS (+)   Abdominal   Peds negative pediatric ROS (+)  Hematology negative hematology ROS (+)   Anesthesia Other Findings   Reproductive/Obstetrics (+) Pregnancy                             Anesthesia Physical Anesthesia Plan  ASA: III  Anesthesia Plan: Epidural   Post-op Pain Management:    Induction:   Airway Management Planned:   Additional Equipment:   Intra-op Plan:   Post-operative Plan:   Informed Consent: I have reviewed the patients History and Physical, chart, labs and discussed the procedure including the risks, benefits and alternatives for the proposed anesthesia with the patient or authorized representative who has indicated his/her understanding and acceptance.   Dental advisory given  Plan Discussed with: CRNA  Anesthesia Plan Comments:         Anesthesia Quick Evaluation

## 2016-01-01 NOTE — Anesthesia Postprocedure Evaluation (Signed)
Anesthesia Post Note  Patient: [name]  Procedure(s) Performed: * No procedures listed *  Patient location during evaluation: Mother Baby Anesthesia Type: Epidural Level of consciousness: awake and alert Pain management: satisfactory to patient Vital Signs Assessment: post-procedure vital signs reviewed and stable Respiratory status: respiratory function stable Cardiovascular status: stable Postop Assessment: no headache, no backache, epidural receding, patient able to bend at knees, no signs of nausea or vomiting and adequate PO intake Anesthetic complications: no Comments: Comfort level was assessed by AnesthesiaTeam and the patient was pleased with the care, interventions, and services provided by the Department of Anesthesia.     Last Vitals:  Filed Vitals:   01/01/16 1500 01/01/16 1600  BP: 137/86 153/98  Pulse: 91 92  Temp:  36.7 C  Resp: 18 18    Last Pain:  Filed Vitals:   01/01/16 1723  PainSc: 0-No pain   Pain Goal: Patients Stated Pain Goal: 6 (01/01/16 0910)               Karleen DolphinFUSSELL,Jenesys Casseus

## 2016-01-01 NOTE — Anesthesia Procedure Notes (Signed)
Epidural Patient location during procedure: OB  Staffing Anesthesiologist: Bryanda Mikel Performed by: anesthesiologist   Preanesthetic Checklist Completed: patient identified, site marked, surgical consent, pre-op evaluation, timeout performed, IV checked, risks and benefits discussed and monitors and equipment checked  Epidural Patient position: sitting Prep: site prepped and draped and DuraPrep Patient monitoring: continuous pulse ox and blood pressure Approach: midline Location: L3-L4 Injection technique: LOR saline  Needle:  Needle type: Tuohy  Needle gauge: 17 G Needle length: 9 cm and 9 Needle insertion depth: 7 cm Catheter type: closed end flexible Catheter size: 19 Gauge Catheter at skin depth: 11 cm Test dose: negative  Assessment Events: blood not aspirated, injection not painful, no injection resistance, negative IV test and no paresthesia  Additional Notes Patient identified. Risks/Benefits/Options discussed with patient including but not limited to bleeding, infection, nerve damage, paralysis, failed block, incomplete pain control, headache, blood pressure changes, nausea, vomiting, reactions to medication both or allergic, itching and postpartum back pain. Confirmed with bedside nurse the patient's most recent platelet count. Confirmed with patient that they are not currently taking any anticoagulation, have any bleeding history or any family history of bleeding disorders. Patient expressed understanding and wished to proceed. All questions were answered. Sterile technique was used throughout the entire procedure. Please see nursing notes for vital signs. Test dose was given through epidural catheter and negative prior to continuing to dose epidural or start infusion. Warning signs of high block given to the patient including shortness of breath, tingling/numbness in hands, complete motor block, or any concerning symptoms with instructions to call for help. Patient was  given instructions on fall risk and not to get out of bed. All questions and concerns addressed with instructions to call with any issues or inadequate analgesia.      

## 2016-01-02 LAB — CBC
HEMATOCRIT: 26.8 % — AB (ref 36.0–46.0)
HEMOGLOBIN: 9.1 g/dL — AB (ref 12.0–15.0)
MCH: 29.1 pg (ref 26.0–34.0)
MCHC: 34 g/dL (ref 30.0–36.0)
MCV: 85.6 fL (ref 78.0–100.0)
Platelets: 271 10*3/uL (ref 150–400)
RBC: 3.13 MIL/uL — ABNORMAL LOW (ref 3.87–5.11)
RDW: 15.5 % (ref 11.5–15.5)
WBC: 9.7 10*3/uL (ref 4.0–10.5)

## 2016-01-02 MED ORDER — LABETALOL HCL 100 MG PO TABS
200.0000 mg | ORAL_TABLET | Freq: Three times a day (TID) | ORAL | Status: DC
  Administered 2016-01-02 – 2016-01-03 (×3): 200 mg via ORAL
  Filled 2016-01-02 (×3): qty 2

## 2016-01-02 NOTE — Progress Notes (Signed)
Post Partum Day 1 Subjective: no complaints  Objective: Blood pressure 151/89, pulse 79, temperature 98 F (36.7 C), temperature source Oral, resp. rate 20, height 5\' 2"  (1.575 m), weight 214 lb (97.07 kg), SpO2 99 %, unknown if currently breastfeeding.  Physical Exam:  General: alert and no distress Lochia: appropriate Uterine Fundus: firm Incision: none DVT Evaluation: No evidence of DVT seen on physical exam.   Recent Labs  01/01/16 1118 01/02/16 0600  HGB 9.4* 9.1*  HCT 28.6* 26.8*    Assessment/Plan: Elevated BP off magnesium sulfate.  Labetalol po started. Plan for discharge tomorrow   LOS: 2 days   HARPER,CHARLES A 01/02/2016, 5:01 PM

## 2016-01-02 NOTE — Progress Notes (Signed)
Dr. Clearance CootsHarper called about Consistent Elevated BP. New medication ordered. See orders.

## 2016-01-03 MED ORDER — FUSION PLUS PO CAPS: 1.0000 | ORAL_CAPSULE | Freq: Every day | ORAL | Status: DC

## 2016-01-03 MED ORDER — LABETALOL HCL 200 MG PO TABS: 200.0000 mg | ORAL_TABLET | Freq: Three times a day (TID) | ORAL | Status: DC

## 2016-01-03 MED ORDER — OXYCODONE-ACETAMINOPHEN 5-325 MG PO TABS: 1.0000 | ORAL_TABLET | ORAL | Status: DC | PRN

## 2016-01-03 MED ORDER — IBUPROFEN 600 MG PO TABS: 600.0000 mg | ORAL_TABLET | Freq: Four times a day (QID) | ORAL | Status: DC | PRN

## 2016-01-03 NOTE — Progress Notes (Signed)
Post Partum Day 2 Subjective: no complaints  Objective: Blood pressure 141/86, pulse 82, temperature 98.8 F (37.1 C), temperature source Oral, resp. rate 16, height 5\' 2"  (1.575 m), weight 214 lb (97.07 kg), SpO2 99 %, unknown if currently breastfeeding.  Physical Exam:  General: alert and no distress Lochia: appropriate Uterine Fundus: firm Incision: none DVT Evaluation: No evidence of DVT seen on physical exam.   Recent Labs  01/01/16 1118 01/02/16 0600  HGB 9.4* 9.1*  HCT 28.6* 26.8*    Assessment/Plan: Anemia.  Clinically stable.  Iron Rx Preeclampsia.  Stable.  Continue Labetalol. Discharge home   LOS: 3 days   Mirza Kidney A 01/03/2016, 8:42 AM

## 2016-01-03 NOTE — Discharge Instructions (Signed)

## 2016-01-03 NOTE — Discharge Summary (Signed)
Obstetric Discharge Summary Reason for Admission: Elevated blood pressure Prenatal Procedures: NST and ultrasound Intrapartum Procedures: spontaneous vaginal delivery Postpartum Procedures: Labetalol Complications-Operative and Postpartum: none HEMOGLOBIN  Date Value Ref Range Status  01/02/2016 9.1* 12.0 - 15.0 g/dL Final   HCT  Date Value Ref Range Status  01/02/2016 26.8* 36.0 - 46.0 % Final    Physical Exam:  General: alert and no distress Lochia: appropriate Uterine Fundus: firm Incision: none DVT Evaluation: No evidence of DVT seen on physical exam.  Discharge Diagnoses: Term Pregnancy-delivered and Preelampsia and mild anemia  Discharge Information: Date: 01/03/2016 Activity: pelvic rest Diet: routine Medications: PNV, Ibuprofen, Colace, Iron and Percocet Condition: stable Instructions: refer to practice specific booklet Discharge to: home Follow-up Information    Follow up with Kiahna Banghart A, MD. Schedule an appointment as soon as possible for a visit in 2 weeks.   Specialty:  Obstetrics and Gynecology   Why:  BP check   Contact information:   [address] [place] [address] [phone]       Newborn Data: Live born female  Birth Weight: 7 lb 2.3 oz (3240 g) APGAR: 8, 9  Home with mother.  Avel Ogawa A 01/03/2016, 8:51 AM

## 2016-05-13 LAB — LAB REPORT - SCANNED: Pap: NEGATIVE

## 2016-11-17 ENCOUNTER — Encounter (HOSPITAL_COMMUNITY): Payer: Self-pay | Admitting: Emergency Medicine

## 2016-11-17 ENCOUNTER — Ambulatory Visit (HOSPITAL_COMMUNITY)
Admission: EM | Admit: 2016-11-17 | Discharge: 2016-11-17 | Disposition: A | Discharge status: Home / Self Care | Payer: Medicaid Other | Attending: Family Medicine | Admitting: Family Medicine

## 2016-11-17 DIAGNOSIS — I10 Essential (primary) hypertension: Secondary | ICD-10-CM

## 2016-11-17 DIAGNOSIS — Z760 Encounter for issue of repeat prescription: Secondary | ICD-10-CM

## 2016-11-17 MED ORDER — AMLODIPINE BESYLATE 10 MG PO TABS: 10.0000 mg | ORAL_TABLET | Freq: Every day | ORAL | 0 refills | Status: DC

## 2016-11-17 NOTE — ED Triage Notes (Signed)
Pt is between insurance companies and has run out of her blood pressure medication for over two months.

## 2016-11-17 NOTE — ED Provider Notes (Signed)
CSN: [id number]     Arrival date & time 11/17/16  1609 History   None    Chief Complaint  Patient presents with  . Medication Refill   (Consider location/radiation/quality/duration/timing/severity/associated sxs/prior Treatment) HPI: 31 Y/O female presented with request for medication refill. +ve H/O Hypertension, on Norvasc 10 mg once daily, states ran out of medication x 2 months secondary to lack of health coverage. Pt denies physical complaints, currently asymptomatic.   Past Medical History:  Diagnosis Date  . Anemia   . Hypertension    Past Surgical History:  Procedure Laterality Date  . NO PAST SURGERIES     Family History  Problem Relation Age of Onset  . Hypertension Mother    Social History  Substance Use Topics  . Smoking status: Former Smoker    Quit date: 03/29/2013  . Smokeless tobacco: Never Used  . Alcohol use Yes   OB History    Gravida Para Term Preterm AB Living   3 3 1     1    SAB TAB Ectopic Multiple Live Births         0 1     Review of Systems  Constitutional: Negative.   HENT: Negative.   Eyes: Negative.   Respiratory: Negative.   Cardiovascular: Negative.   Gastrointestinal: Negative.   Genitourinary: Negative.   Musculoskeletal: Negative.   Neurological: Negative.   Psychiatric/Behavioral: Negative.     Allergies  Patient has no known allergies.  Home Medications   Prior to Admission medications   Medication Sig Start Date End Date Taking? Authorizing Provider  ibuprofen (ADVIL,MOTRIN) 600 MG tablet Take 1 tablet (600 mg total) by mouth every 6 (six) hours as needed for mild pain or moderate pain. 01/03/16   Brock Badharles A Harper, MD  Iron-FA-B Cmp-C-Biot-Probiotic (FUSION PLUS) CAPS Take 1 capsule by mouth daily before breakfast. 01/03/16   Brock Badharles A Harper, MD  labetalol (NORMODYNE) 200 MG tablet Take 1 tablet (200 mg total) by mouth 3 (three) times daily. 01/03/16   Brock Badharles A Harper, MD  oxyCODONE-acetaminophen (PERCOCET/ROXICET) 5-325 MG  tablet Take 1-2 tablets by mouth every 4 (four) hours as needed for moderate pain or severe pain. 01/03/16   Brock Badharles A Harper, MD   Meds Ordered and Administered this Visit  Medications - No data to display  BP (!) 165/99 (BP Location: Right Arm)   Pulse 81   Temp 99 F (37.2 C) (Oral)   LMP 11/11/2016 (Approximate)   SpO2 100%  No data found.   Physical Exam  Constitutional: She is oriented to person, place, and time. She appears well-developed and well-nourished.  HENT:  Head: Normocephalic.  Eyes: Pupils are equal, round, and reactive to light.  Neck: Normal range of motion.  Cardiovascular: Normal rate, regular rhythm and normal heart sounds.   Pulmonary/Chest: Effort normal and breath sounds normal.  Neurological: She is alert and oriented to person, place, and time.  Skin: Skin is warm.  Psychiatric: She has a normal mood and affect. Her behavior is normal. Thought content normal.  Pt asymptomatic. Denies any physical complaints. No health concerns. Reports BP well controlled on Norvasc. Were switched to Norvasc by PCP after pregnancy.  Urgent Care Course     Procedures (including critical care time)  Labs Review Labs Reviewed - No data to display  Imaging Review No results found.   Visual Acuity Review  Right Eye Distance:   Left Eye Distance:   Bilateral Distance:    Right Eye Near:  Left Eye Near:    Bilateral Near:         MDM   1. Essential hypertension   2. Medication refill   Pt educated on importance of taking medication on regular basis.  Encouraged to establish Health Insurance and FU with PCP for further management of high Blood pressure.  Norvasc 30 days supply provided as patient states were switched  From Labetalol to Norvasc by PCP and BP is well controlled on Norvasc 10 mg once a day.    Romell Cavanah, NP 11/17/16 1733

## 2016-11-17 NOTE — Discharge Instructions (Signed)
Pt educated on importance of taking medication on regular basis.  Encouraged to establish Health Insurance and FU with PCP for further management of high Blood pressure.  Norvasc 30 days supply provided

## 2016-11-25 ENCOUNTER — Encounter (HOSPITAL_COMMUNITY): Payer: Self-pay | Admitting: Emergency Medicine

## 2016-11-25 ENCOUNTER — Emergency Department (HOSPITAL_COMMUNITY)
Admission: EM | Admit: 2016-11-25 | Discharge: 2016-11-25 | Disposition: A | Discharge status: Home / Self Care | Payer: Medicaid Other | Attending: Emergency Medicine | Admitting: Emergency Medicine

## 2016-11-25 DIAGNOSIS — I10 Essential (primary) hypertension: Secondary | ICD-10-CM | POA: Insufficient documentation

## 2016-11-25 DIAGNOSIS — R197 Diarrhea, unspecified: Secondary | ICD-10-CM | POA: Insufficient documentation

## 2016-11-25 DIAGNOSIS — Z87891 Personal history of nicotine dependence: Secondary | ICD-10-CM | POA: Insufficient documentation

## 2016-11-25 DIAGNOSIS — R112 Nausea with vomiting, unspecified: Secondary | ICD-10-CM | POA: Insufficient documentation

## 2016-11-25 DIAGNOSIS — Z79899 Other long term (current) drug therapy: Secondary | ICD-10-CM | POA: Insufficient documentation

## 2016-11-25 LAB — COMPREHENSIVE METABOLIC PANEL
ALT: 17 U/L (ref 14–54)
ANION GAP: 13 (ref 5–15)
AST: 24 U/L (ref 15–41)
Albumin: 3.9 g/dL (ref 3.5–5.0)
Alkaline Phosphatase: 39 U/L (ref 38–126)
BUN: 11 mg/dL (ref 6–20)
CHLORIDE: 108 mmol/L (ref 101–111)
CO2: 17 mmol/L — AB (ref 22–32)
Calcium: 9.1 mg/dL (ref 8.9–10.3)
Creatinine, Ser: 0.69 mg/dL (ref 0.44–1.00)
GFR calc non Af Amer: >60 mL/min (ref >60)
Glucose, Bld: 74 mg/dL (ref 65–99)
POTASSIUM: 3.6 mmol/L (ref 3.5–5.1)
SODIUM: 138 mmol/L (ref 135–145)
Total Bilirubin: 0.7 mg/dL (ref 0.3–1.2)
Total Protein: 7.2 g/dL (ref 6.5–8.1)

## 2016-11-25 LAB — I-STAT BETA HCG BLOOD, ED (MC, WL, AP ONLY): I-stat hCG, quantitative: <5 m[IU]/mL (ref <5)

## 2016-11-25 LAB — CBC
HEMATOCRIT: 36.4 % (ref 36.0–46.0)
HEMOGLOBIN: 12.4 g/dL (ref 12.0–15.0)
MCH: 30.1 pg (ref 26.0–34.0)
MCHC: 34.1 g/dL (ref 30.0–36.0)
MCV: 88.3 fL (ref 78.0–100.0)
Platelets: 353 10*3/uL (ref 150–400)
RBC: 4.12 MIL/uL (ref 3.87–5.11)
RDW: 13.3 % (ref 11.5–15.5)
WBC: 6.2 10*3/uL (ref 4.0–10.5)

## 2016-11-25 LAB — LIPASE, BLOOD: LIPASE: 20 U/L (ref 11–51)

## 2016-11-25 MED ORDER — DIPHENOXYLATE-ATROPINE 2.5-0.025 MG PO TABS: 2.0000 | ORAL_TABLET | Freq: Four times a day (QID) | ORAL | 0 refills | Status: DC | PRN

## 2016-11-25 MED ORDER — ONDANSETRON HCL 4 MG/2ML IJ SOLN
4.0000 mg | Freq: Once | INTRAMUSCULAR | Status: AC
  Administered 2016-11-25: 4 mg via INTRAVENOUS
  Filled 2016-11-25: qty 2

## 2016-11-25 MED ORDER — SODIUM CHLORIDE 0.9 % IV BOLUS (SEPSIS)
1000.0000 mL | Freq: Once | INTRAVENOUS | Status: AC
  Administered 2016-11-25: 1000 mL via INTRAVENOUS

## 2016-11-25 MED ORDER — ONDANSETRON HCL 4 MG PO TABS: 4.0000 mg | ORAL_TABLET | Freq: Four times a day (QID) | ORAL | 0 refills | Status: DC

## 2016-11-25 MED ORDER — ONDANSETRON 4 MG PO TBDP: 4.0000 mg | ORAL_TABLET | Freq: Once | ORAL | Status: DC | PRN

## 2016-11-25 MED ORDER — DIPHENOXYLATE-ATROPINE 2.5-0.025 MG PO TABS
2.0000 | ORAL_TABLET | Freq: Once | ORAL | Status: AC
  Administered 2016-11-25: 2 via ORAL
  Filled 2016-11-25: qty 2

## 2016-11-25 NOTE — ED Triage Notes (Signed)
Pt arrives with emesisx1 and multiple diarrhea starting yesterday. States tried to go to work but was unable to make the drive without having to stop to take a bathroom break. Pt reports lower abdominal pain 8/10.

## 2016-11-25 NOTE — ED Provider Notes (Signed)
[facility] Provider Note   CSN: [id number] Arrival date & time: 11/25/16  0506     History   Chief Complaint Chief Complaint  Patient presents with  . Diarrhea    HPI [name] is a 31 y.o. female.  Patient presents to the emergency department for evaluation of nausea, vomiting, diarrhea with abdominal cramping. Patient reports that symptoms began yesterday. She woke up this morning to go to work but has had vomiting and diarrhea this morning. Could not make it to work. She reports diffuse abdominal cramping that is relieved by diarrhea, but returns. No fever.      Past Medical History:  Diagnosis Date  . Anemia   . Hypertension     Patient Active Problem List   Diagnosis Date Noted  . NVD (normal vaginal delivery) 01/01/2016  . Normal labor 12/31/2015    Past Surgical History:  Procedure Laterality Date  . NO PAST SURGERIES      OB History    Gravida Para Term Preterm AB Living   SAB TAB Ectopic Multiple Live Births         0 1       Home Medications    Prior to Admission medications   Medication Sig Start Date End Date Taking? Authorizing Provider  amLODipine (NORVASC) 10 MG tablet Take 1 tablet (10 mg total) by mouth daily. 11/17/16   Bhupinder Multani, NP  diphenoxylate-atropine (LOMOTIL) 2.5-0.025 MG tablet Take 2 tablets by mouth 4 (four) times daily as needed for diarrhea or loose stools. 11/25/16   Gilda Crease, MD  ibuprofen (ADVIL,MOTRIN) 600 MG tablet Take 1 tablet (600 mg total) by mouth every 6 (six) hours as needed for mild pain or moderate pain. 01/03/16   Brock Bad, MD  Iron-FA-B Cmp-C-Biot-Probiotic (FUSION PLUS) CAPS Take 1 capsule by mouth daily before breakfast. 01/03/16   Brock Bad, MD  labetalol (NORMODYNE) 200 MG tablet Take 1 tablet (200 mg total) by mouth 3 (three) times daily. 01/03/16   Brock Bad, MD  ondansetron (ZOFRAN) 4 MG tablet Take 1 tablet (4 mg total) by mouth every 6  (six) hours. 11/25/16   Gilda Crease, MD  oxyCODONE-acetaminophen (PERCOCET/ROXICET) 5-325 MG tablet Take 1-2 tablets by mouth every 4 (four) hours as needed for moderate pain or severe pain. 01/03/16   Brock Bad, MD    Family History Family History  Problem Relation Age of Onset  . Hypertension Mother     Social History Social History  Substance Use Topics  . Smoking status: Former Smoker    Quit date: 03/29/2013  . Smokeless tobacco: Never Used  . Alcohol use Yes     Allergies   Patient has no known allergies.   Review of Systems Review of Systems  Gastrointestinal: Positive for abdominal pain, diarrhea, nausea and vomiting.  All other systems reviewed and are negative.    Physical Exam Updated Vital Signs BP (!) 153/103 (BP Location: Left Arm)   Pulse 96   Temp 97.5 F (36.4 C) (Oral)   Resp 18   Ht  (1.575 m)   Wt 200 lb (90.7 kg)   LMP 11/11/2016 (Approximate) Comment: infrequent d/t iud  SpO2 99%   BMI 36.58 kg/m   Physical Exam  Constitutional: She is oriented to person, place, and time. She appears well-developed and well-nourished. No distress.  HENT:  Head: Normocephalic and atraumatic.  Right Ear:  Hearing normal.  Left Ear: Hearing normal.  Nose: Nose normal.  Mouth/Throat: Oropharynx is clear and moist and mucous membranes are normal.  Eyes: Conjunctivae and EOM are normal. Pupils are equal, round, and reactive to light.  Neck: Normal range of motion. Neck supple.  Cardiovascular: Regular rhythm, S1 normal and S2 normal.  Exam reveals no gallop and no friction rub.   No murmur heard. Pulmonary/Chest: Effort normal and breath sounds normal. No respiratory distress. She exhibits no tenderness.  Abdominal: Soft. Normal appearance and bowel sounds are normal. There is no hepatosplenomegaly. There is tenderness. There is no rebound, no guarding, no tenderness at McBurney's point and negative Murphy's sign. No hernia.  Mild diffuse  tenderness without guarding or rebound  Musculoskeletal: Normal range of motion.  Neurological: She is alert and oriented to person, place, and time. She has normal strength. No cranial nerve deficit or sensory deficit. Coordination normal. GCS eye subscore is 4. GCS verbal subscore is 5. GCS motor subscore is 6.  Skin: Skin is warm, dry and intact. No rash noted. No cyanosis.  Psychiatric: She has a normal mood and affect. Her speech is normal and behavior is normal. Thought content normal.  Nursing note and vitals reviewed.    ED Treatments / Results  Labs (all labs ordered are listed, but only abnormal results are displayed) Labs Reviewed  COMPREHENSIVE METABOLIC PANEL - Abnormal; Notable for the following:       Result Value   CO2 17 (*)    All other components within normal limits  LIPASE, BLOOD  CBC  URINALYSIS, ROUTINE W REFLEX MICROSCOPIC  I-STAT BETA HCG BLOOD, ED ([facility], [facility], [facility] ONLY)    EKG  EKG Interpretation None       Radiology No results found.  Procedures Procedures (including critical care time)  Medications Ordered in ED Medications  sodium chloride 0.9 % bolus 1,000 mL (1,000 mLs Intravenous New Bag/Given 11/25/16 0605)  diphenoxylate-atropine (LOMOTIL) 2.5-0.025 MG per tablet 2 tablet (2 tablets Oral Given 11/25/16 0606)  ondansetron (ZOFRAN) injection 4 mg (4 mg Intravenous Given 11/25/16 0606)     Initial Impression / Assessment and Plan / ED Course  I have reviewed the triage vital signs and the nursing notes.  Pertinent labs & imaging results that were available during my care of the patient were reviewed by me and considered in my medical decision making (see chart for details).     Patient presents to the emergency department for evaluation of nausea, vomiting, diarrhea with abdominal pain. Patient has benign abdominal exam. Lab work is normal. Presentation is consistent with a gastroenteritis. She was given IV fluids and symptomatic treatment with  improvement. Patient continues to do well, is appropriate for discharge with outpatient management of symptoms, follow-up as needed.  Final Clinical Impressions(s) / ED Diagnoses   Final diagnoses:  Nausea vomiting and diarrhea    New Prescriptions New Prescriptions   DIPHENOXYLATE-ATROPINE (LOMOTIL) 2.5-0.025 MG TABLET    Take 2 tablets by mouth 4 (four) times daily as needed for diarrhea or loose stools.   ONDANSETRON (ZOFRAN) 4 MG TABLET    Take 1 tablet (4 mg total) by mouth every 6 (six) hours.     Gilda Crease, MD 11/25/16 [phone]

## 2016-12-30 ENCOUNTER — Encounter: Payer: Self-pay | Admitting: Obstetrics and Gynecology

## 2017-01-13 ENCOUNTER — Encounter (HOSPITAL_COMMUNITY): Payer: Self-pay | Admitting: Emergency Medicine

## 2017-01-13 ENCOUNTER — Ambulatory Visit (HOSPITAL_COMMUNITY)
Admission: EM | Admit: 2017-01-13 | Discharge: 2017-01-13 | Disposition: A | Discharge status: Home / Self Care | Payer: BLUE CROSS/BLUE SHIELD | Attending: Family Medicine | Admitting: Family Medicine

## 2017-01-13 DIAGNOSIS — I10 Essential (primary) hypertension: Secondary | ICD-10-CM | POA: Diagnosis not present

## 2017-01-13 MED ORDER — AMLODIPINE BESYLATE 10 MG PO TABS: 10.0000 mg | ORAL_TABLET | Freq: Every day | ORAL | 3 refills | Status: DC

## 2017-01-13 NOTE — ED Provider Notes (Signed)
[facility]    CSN: [id number] Arrival date & time: 01/13/17  1326     History   Chief Complaint Chief Complaint  Patient presents with  . Medication Refill    HPI [name] is a 31 y.o. female.   [facility] on her Norvasc. She's had elevated blood pressure readings since her first child 2008. With her second pregnancy, her blood pressure never went down and she was placed on Norvasc permanently.  Patient is a single mother with 2 children. She works at [employer]. She knows she needs to cut down on her salt. She also knows she needs to lose weight. She is recently got a primary care doctor.      Past Medical History:  Diagnosis Date  . Anemia   . Hypertension     Patient Active Problem List   Diagnosis Date Noted  . NVD (normal vaginal delivery) 01/01/2016  . Normal labor 12/31/2015    Past Surgical History:  Procedure Laterality Date  . NO PAST SURGERIES      OB History    Gravida Para Term Preterm AB Living   3 3 1     1    SAB TAB Ectopic Multiple Live Births         0 1       Home Medications    Prior to Admission medications   Medication Sig Start Date End Date Taking? Authorizing Provider  amLODipine (NORVASC) 10 MG tablet Take 1 tablet (10 mg total) by mouth daily. 01/13/17   Elvina SidleLauenstein, Briar Sword, MD    Family History Family History  Problem Relation Age of Onset  . Hypertension Mother     Social History Social History  Substance Use Topics  . Smoking status: Former Smoker    Quit date: 03/29/2013  . Smokeless tobacco: Never Used  . Alcohol use Yes     Allergies   Patient has no known allergies.   Review of Systems Review of Systems  All other systems reviewed and are negative.    Physical Exam Triage Vital Signs ED Triage Vitals  Enc Vitals Group     BP 01/13/17 1349 (!) 164/106     Pulse Rate 01/13/17 1349 82     Resp 01/13/17 1349 18     Temp 01/13/17 1349 98.4 F  (36.9 C)     Temp Source 01/13/17 1349 Oral     SpO2 01/13/17 1349 100 %     Weight --      Height --      Head Circumference --      Peak Flow --      Pain Score 01/13/17 1347 0     Pain Loc --      Pain Edu? --      Excl. in GC? --    No data found.   Updated Vital Signs BP (!) 164/106 (BP Location: Left Arm)   Pulse 82   Temp 98.4 F (36.9 C) (Oral)   Resp 18   SpO2 100%   Visual Acuity Right Eye Distance:   Left Eye Distance:   Bilateral Distance:    Right Eye Near:   Left Eye Near:    Bilateral Near:     Physical Exam  Constitutional: She is oriented to person, place, and time. She appears well-developed and well-nourished.  HENT:  Right Ear: External ear normal.  Left Ear: External ear normal.  Mouth/Throat: Oropharynx is  clear and moist.  Eyes: Conjunctivae and EOM are normal. Pupils are equal, round, and reactive to light.  Neck: Normal range of motion. Neck supple.  Cardiovascular: Normal rate, regular rhythm and normal heart sounds.   Pulmonary/Chest: Effort normal and breath sounds normal.  Musculoskeletal: Normal range of motion.  Neurological: She is alert and oriented to person, place, and time.  Skin: Skin is warm and dry.  Nursing note and vitals reviewed.    UC Treatments / Results  Labs (all labs ordered are listed, but only abnormal results are displayed) Labs Reviewed - No data to display  EKG  EKG Interpretation None       Radiology No results found.  Procedures Procedures (including critical care time)  Medications Ordered in UC Medications - No data to display   Initial Impression / Assessment and Plan / UC Course  I have reviewed the triage vital signs and the nursing notes.  Pertinent labs & imaging results that were available during my care of the patient were reviewed by me and considered in my medical decision making (see chart for details).     Final Clinical Impressions(s) / UC Diagnoses   Final diagnoses:    Essential hypertension    New Prescriptions New Prescriptions   AMLODIPINE (NORVASC) 10 MG TABLET    Take 1 tablet (10 mg total) by mouth daily.     Elvina Sidle, MD 01/13/17 1400

## 2017-01-13 NOTE — ED Triage Notes (Signed)
[facility] her Nor[facility]asc.

## 2017-01-28 ENCOUNTER — Encounter: Payer: Medicaid Other | Admitting: Obstetrics and Gynecology

## 2017-04-02 ENCOUNTER — Encounter: Payer: BLUE CROSS/BLUE SHIELD | Admitting: Advanced Practice Midwife

## 2017-06-17 ENCOUNTER — Telehealth: Payer: Self-pay | Admitting: General Practice

## 2017-06-17 NOTE — Telephone Encounter (Signed)
Patient called and left message stating she needs her appt rescheduled for IUD removal. The best appt time for her is after 1 in the afternoon.

## 2017-07-02 ENCOUNTER — Encounter: Payer: Self-pay | Admitting: General Practice

## 2017-07-27 ENCOUNTER — Ambulatory Visit (INDEPENDENT_AMBULATORY_CARE_PROVIDER_SITE_OTHER): Payer: BLUE CROSS/BLUE SHIELD | Admitting: Family Medicine

## 2017-07-27 ENCOUNTER — Encounter: Payer: Self-pay | Admitting: Family Medicine

## 2017-07-27 VITALS — BP 160/110 | HR 86 | Wt 214.5 lb

## 2017-07-27 DIAGNOSIS — Z30432 Encounter for removal of intrauterine contraceptive device: Secondary | ICD-10-CM

## 2017-07-27 DIAGNOSIS — I10 Essential (primary) hypertension: Secondary | ICD-10-CM

## 2017-07-27 MED ORDER — AMLODIPINE BESYLATE 10 MG PO TABS: 10.0000 mg | ORAL_TABLET | Freq: Every day | ORAL | 0 refills | Status: DC

## 2017-07-27 MED ORDER — NORGESTIMATE-ETH ESTRADIOL 0.25-35 MG-MCG PO TABS: 1.0000 | ORAL_TABLET | Freq: Every day | ORAL | 11 refills | Status: DC

## 2017-07-27 NOTE — Progress Notes (Signed)
    GYNECOLOGY OFFICE PROCEDURE NOTE  [name] is a 31 y.o. G3P1001 here for Liletta IUD removal. No GYN concerns.    IUD Removal  Patient identified, informed consent performed, consent signed.  Patient was in the dorsal lithotomy position, normal external genitalia was noted.  A speculum was placed in the patient's vagina, normal discharge was noted, no lesions. The cervix was visualized, no lesions, no abnormal discharge.  The strings of the IUD were not visualized, so Kelly forceps were introduced into the endometrial cavity and the IUD was grasped and removed in its entirety.  Patient tolerated the procedure well.    Patient will use OCP for contraception. Routine preventative health maintenance measures emphasized. Prescribed OCP and given 1 refill for amlodopine. Patient has been out of anti-HTN for couple weeks.   Anna BookbinderAmber Ireoluwa Gorsline, DO [facility], [facility]

## 2017-08-25 NOTE — L&D Delivery Note (Addendum)
Delivery Note Patient is a 32 y.o. now [id number] s/p NSVD at 5779w2d, who was admitted for IOL for oligohydramnios. She progressed with Pitocin augmentation to complete and began to push for delivery.  After an hour of pushing, she complained of being too tired to push, refused to push, and began requesting physician assistance.  We repositioned her to sitting up to allow baby to labor down.  Discussed her labor progress with Dr. Adrian BlackwaterStinson, including possible vacuum assisted delivery. Within 15min, patient complained of intense pressure and pushed once to deliver. At 10:02 AM a viable female was delivered via  (Presentation: OP to ROA).  APGAR: 9,9; weight: 8lbs, 9.2oz (3890g)  .  Cord clamping delayed by several minutes then clamped by SNM and cut by FOB.  Placenta intact and spontaneous, bleeding minimal. 3-vessel cord without complications. Cord pH: n/a Minimal labial abrasions, and no lacerations.  Mom and baby stable prior to transfer to OR for BTL. She plans on breast and bottlefeeding.  Anesthesia:  Epidural Episiotomy: None Lacerations: None Suture Repair: none Est. Blood Loss (mL):    Mom to OR.  Baby to Couplet care / Skin to Skin.  Bernerd LimboJamilla R Walker, SNM 07/20/18, 11:53 AM   I was gloved and present for entire second stage and delivery SVD without incident No difficulty with shoulders No lacerations   Clayton BiblesSamantha Samentha Perham, CNM 07/20/18  1:46 PM

## 2017-11-24 ENCOUNTER — Emergency Department (HOSPITAL_COMMUNITY)
Admission: EM | Admit: 2017-11-24 | Discharge: 2017-11-24 | Disposition: A | Discharge status: Home / Self Care | Payer: Medicaid Other | Attending: Emergency Medicine | Admitting: Emergency Medicine

## 2017-11-24 ENCOUNTER — Emergency Department (HOSPITAL_COMMUNITY): Payer: Medicaid Other

## 2017-11-24 DIAGNOSIS — Z3201 Encounter for pregnancy test, result positive: Secondary | ICD-10-CM | POA: Diagnosis not present

## 2017-11-24 DIAGNOSIS — O9989 Other specified diseases and conditions complicating pregnancy, childbirth and the puerperium: Secondary | ICD-10-CM | POA: Insufficient documentation

## 2017-11-24 DIAGNOSIS — Z79899 Other long term (current) drug therapy: Secondary | ICD-10-CM | POA: Insufficient documentation

## 2017-11-24 DIAGNOSIS — R197 Diarrhea, unspecified: Secondary | ICD-10-CM | POA: Diagnosis not present

## 2017-11-24 DIAGNOSIS — Z87891 Personal history of nicotine dependence: Secondary | ICD-10-CM | POA: Diagnosis not present

## 2017-11-24 DIAGNOSIS — O26899 Other specified pregnancy related conditions, unspecified trimester: Secondary | ICD-10-CM | POA: Insufficient documentation

## 2017-11-24 DIAGNOSIS — R112 Nausea with vomiting, unspecified: Secondary | ICD-10-CM

## 2017-11-24 DIAGNOSIS — R1110 Vomiting, unspecified: Secondary | ICD-10-CM

## 2017-11-24 DIAGNOSIS — O219 Vomiting of pregnancy, unspecified: Secondary | ICD-10-CM | POA: Insufficient documentation

## 2017-11-24 DIAGNOSIS — I10 Essential (primary) hypertension: Secondary | ICD-10-CM

## 2017-11-24 DIAGNOSIS — R109 Unspecified abdominal pain: Secondary | ICD-10-CM | POA: Diagnosis not present

## 2017-11-24 DIAGNOSIS — Z3A00 Weeks of gestation of pregnancy not specified: Secondary | ICD-10-CM | POA: Insufficient documentation

## 2017-11-24 DIAGNOSIS — O10019 Pre-existing essential hypertension complicating pregnancy, unspecified trimester: Secondary | ICD-10-CM | POA: Diagnosis not present

## 2017-11-24 LAB — URINALYSIS, ROUTINE W REFLEX MICROSCOPIC
Bilirubin Urine: NEGATIVE
GLUCOSE, UA: NEGATIVE mg/dL
Ketones, ur: NEGATIVE mg/dL
Nitrite: NEGATIVE
PROTEIN: NEGATIVE mg/dL
SPECIFIC GRAVITY, URINE: 1.006 (ref 1.005–1.030)
pH: 6 (ref 5.0–8.0)

## 2017-11-24 LAB — I-STAT CHEM 8, ED
BUN: <3 mg/dL — ABNORMAL LOW (ref 6–20)
CALCIUM ION: 1.07 mmol/L — AB (ref 1.15–1.40)
CREATININE: 0.5 mg/dL (ref 0.44–1.00)
Chloride: 105 mmol/L (ref 101–111)
GLUCOSE: 93 mg/dL (ref 65–99)
HCT: 37 % (ref 36.0–46.0)
HEMOGLOBIN: 12.6 g/dL (ref 12.0–15.0)
Potassium: 3.6 mmol/L (ref 3.5–5.1)
Sodium: 139 mmol/L (ref 135–145)
TCO2: 21 mmol/L — AB (ref 22–32)

## 2017-11-24 LAB — HCG, QUANTITATIVE, PREGNANCY: hCG, Beta Chain, Quant, S: 6499 m[IU]/mL — ABNORMAL HIGH (ref <5)

## 2017-11-24 LAB — POC URINE PREG, ED: Preg Test, Ur: POSITIVE — AB

## 2017-11-24 MED ORDER — ONDANSETRON HCL 4 MG PO TABS
4.0000 mg | ORAL_TABLET | Freq: Once | ORAL | Status: AC
  Administered 2017-11-24: 4 mg via ORAL
  Filled 2017-11-24: qty 1

## 2017-11-24 MED ORDER — DOXYLAMINE-PYRIDOXINE 10-10 MG PO TBEC: 1.0000 | DELAYED_RELEASE_TABLET | Freq: Every day | ORAL | 0 refills | Status: DC

## 2017-11-24 NOTE — ED Notes (Signed)
Patient transported to Ultrasound 

## 2017-11-24 NOTE — ED Provider Notes (Signed)
Patient placed in Quick Look pathway, seen and evaluated   Chief Complaint: N/V/D  HPI:   [name] is a 32 y.o. female who presents to the ED with n/v/d that started 3 days ago. Patient reports that she may vomit 3 times a day and is able to keep down fluids. She reports a few episodes loose brown stools. Patient denies fever or chills.   ROS: GI: n/v/d  Physical Exam:  BP (!) 156/95 (BP Location: Right Arm)   Pulse 80   Temp 98.9 F (37.2 C) (Oral)   Resp 16   LMP 10/13/2017 (Approximate)   SpO2 100%    Gen: No distress  Neuro: Awake and Alert  Skin: Warm and dry  GI: not actively vomiting    Focused Exam:   Initiation of care has begun. The patient has been counseled on the process, plan, and necessity for staying for the completion/evaluation, and the remainder of the medical screening examination    Janne Napoleoneese, Issaac Shipper M, NP 11/24/17 1327    Gerhard MunchLockwood, Robert, MD 11/24/17 1553

## 2017-11-24 NOTE — ED Provider Notes (Signed)
[facility] EMERGENCY DEPARTMENT Provider Note   CSN: [id number] Arrival date & time: 11/24/17  1312     History   Chief Complaint Chief Complaint  Patient presents with  . Emesis  . Diarrhea    HPI BRICEYDA ABDULLAH is a 32 y.o. female.  HPI   32 year old female presents today with complaints of nausea vomiting.  Patient reports 3 days ago she started having nausea and vomiting with loose stools no significant diarrhea.  She reports some minor abdominal cramping that felt like gas, no severe abdominal pain, denies any vaginal bleeding or discharge.  Patient reports that she had an IUD removed in February, was placed on birth control but this was affecting her blood pressure.  Patient notes her last menstrual cycle was on 08/12/2018.  She has 3 children the youngest being 23-year-old.  She denies any fever.  Patient reports that she was on blood pressure medication but is not currently taking it.  Past Medical History:  Diagnosis Date  . Anemia   . Hypertension     Patient Active Problem List   Diagnosis Date Noted  . NVD (normal vaginal delivery) 01/01/2016  . Normal labor 12/31/2015    Past Surgical History:  Procedure Laterality Date  . NO PAST SURGERIES       OB History    Gravida  3   Para  3   Term  1   Preterm      AB      Living  1     SAB      TAB      Ectopic      Multiple  0   Live Births  1            Home Medications    Prior to Admission medications   Medication Sig Start Date End Date Taking? Authorizing Provider  amLODipine (NORVASC) 10 MG tablet Take 1 tablet (10 mg total) by mouth daily. Patient not taking: Reported on 07/27/2017 01/13/17   Elvina Sidle, MD  amLODipine (NORVASC) 10 MG tablet Take 1 tablet (10 mg total) by mouth daily. 07/27/17   Moss, Amber, DO  Doxylamine-Pyridoxine 10-10 MG TBEC Take 1 tablet by mouth at bedtime. Two tablets at bedtime on day 1 and 2; if symptoms persist, take 1 tablet in  morning and 2 tablets at bedtime on day 3 11/24/17   Sherrita Riederer, Tinnie Gens, PA-C  norgestimate-ethinyl estradiol (ORTHO-CYCLEN,SPRINTEC,PREVIFEM) 0.25-35 MG-MCG tablet Take 1 tablet by mouth daily. 07/27/17   Rolm Bookbinder, DO    Family History Family History  Problem Relation Age of Onset  . Hypertension Mother     Social History Social History   Tobacco Use  . Smoking status: Former Smoker    Last attempt to quit: 03/29/2013    Years since quitting: 4.6  . Smokeless tobacco: Never Used  Substance Use Topics  . Alcohol use: Yes  . Drug use: No     Allergies   Patient has no known allergies.   Review of Systems Review of Systems  All other systems reviewed and are negative.  Physical Exam Updated Vital Signs BP (!) 133/94 (BP Location: Right Arm)   Pulse 78   Temp 98 F (36.7 C) (Oral)   Resp 14   LMP 10/13/2017 (Approximate)   SpO2 97%   Physical Exam  Constitutional: She is oriented to person, place, and time. She appears well-developed and well-nourished.  HENT:  Head: Normocephalic and atraumatic.  Eyes: Pupils  are equal, round, and reactive to light. Conjunctivae are normal. Right eye exhibits no discharge. Left eye exhibits no discharge. No scleral icterus.  Neck: Normal range of motion. No JVD present. No tracheal deviation present.  Pulmonary/Chest: Effort normal. No stridor.  Abdominal: Soft. She exhibits no distension and no mass. There is no tenderness. There is no rebound and no guarding. No hernia.  Neurological: She is alert and oriented to person, place, and time. Coordination normal.  Psychiatric: She has a normal mood and affect. Her behavior is normal. Judgment and thought content normal.  Nursing note and vitals reviewed.   ED Treatments / Results  Labs (all labs ordered are listed, but only abnormal results are displayed) Labs Reviewed  URINALYSIS, ROUTINE W REFLEX MICROSCOPIC - Abnormal; Notable for the following components:      Result Value    APPearance CLOUDY (*)    Hgb urine dipstick SMALL (*)    Leukocytes, UA LARGE (*)    Bacteria, UA FEW (*)    Squamous Epithelial / LPF TOO NUMEROUS TO COUNT (*)    All other components within normal limits  HCG, QUANTITATIVE, PREGNANCY - Abnormal; Notable for the following components:   hCG, Beta Chain, Quant, S 6,499 (*)    All other components within normal limits  I-STAT CHEM 8, ED - Abnormal; Notable for the following components:   BUN <3 (*)    Calcium, Ion 1.07 (*)    TCO2 21 (*)    All other components within normal limits  POC URINE PREG, ED - Abnormal; Notable for the following components:   Preg Test, Ur POSITIVE (*)    All other components within normal limits    EKG None  Radiology US Ob Comp Less 14 Wks  Result Date: 11/24/2017 CLINICAL DATA:  Hyperemesis.  Unsure of LMP. EXAM: OBSTETRIC <14 WK [place] AND TRANSVAGINAL OB US TECHNIQUE: Both transabdominal and transvaginal ultrasound examinations were performed for complete evaluation of the gestation as well as the maternal uterus, adnexal regions, and pelvic cul-de-sac. Transvaginal technique was performed to assess early pregnancy. COMPARISON:  None. FINDINGS: Intrauterine gestational sac: Single Yolk sac:  Visualized. Embryo:  Visualized. Cardiac Activity: Not Visualized. CRL:  2 mm   5 w   5 d Subchorionic hemorrhage:  None visualized. Maternal uterus/adnexae: Both ovaries are normal in appearance. Several small follicles noted bilaterally. No adnexal mass or abnormal free fluid identified. IMPRESSION: Single IUP measuring 5 weeks 5 days. Consider following serial b-hCG levels, with followup ultrasound to assess viability in 10 days. No significant maternal uterine or adnexal abnormality identified. Electronically Signed   By: Myles Rosenthal M.D.   On: 11/24/2017 15:53   US Ob Transvaginal  Result Date: 11/24/2017 CLINICAL DATA:  Hyperemesis.  Unsure of LMP. EXAM: OBSTETRIC <14 WK [place] AND TRANSVAGINAL OB US TECHNIQUE: Both  transabdominal and transvaginal ultrasound examinations were performed for complete evaluation of the gestation as well as the maternal uterus, adnexal regions, and pelvic cul-de-sac. Transvaginal technique was performed to assess early pregnancy. COMPARISON:  None. FINDINGS: Intrauterine gestational sac: Single Yolk sac:  Visualized. Embryo:  Visualized. Cardiac Activity: Not Visualized. CRL:  2 mm   5 w   5 d Subchorionic hemorrhage:  None visualized. Maternal uterus/adnexae: Both ovaries are normal in appearance. Several small follicles noted bilaterally. No adnexal mass or abnormal free fluid identified. IMPRESSION: Single IUP measuring 5 weeks 5 days. Consider following serial b-hCG levels, with followup ultrasound to assess viability in 10 days. No significant  maternal uterine or adnexal abnormality identified. Electronically Signed   By: Myles RosenthalJohn  Stahl M.D.   On: 11/24/2017 15:53    Procedures Procedures (including critical care time)  Medications Ordered in ED Medications  ondansetron (ZOFRAN) tablet 4 mg (4 mg Oral Given 11/24/17 1325)     Initial Impression / Assessment and Plan / ED Course  I have reviewed the triage vital signs and the nursing notes.  Pertinent labs & imaging results that were available during my care of the patient were reviewed by me and considered in my medical decision making (see chart for details).       Final Clinical Impressions(s) / ED Diagnoses   Final diagnoses:  Non-intractable vomiting with nausea, unspecified vomiting type  Hypertension, unspecified type   Labs: Point-of-care urine prior, i-STAT Chem-8, urinalysis the  Imaging: Ultrasound OB complete  Consults:  Therapeutics: Zofran given prior to my evaluation  Discharge Meds: doxylamine and pyridoxine  Assessment/Plan: 32 year old female presents today with nausea and vomiting.  This is likely secondary to pregnancy.  She is afebrile well-appearing in no acute distress.  She has a soft  nontender abdomen.  She is pregnant with intrauterine pregnancy.  She was slightly hypertensive here, discussed case with on-call OB/GYN Dr. Adrian BlackwaterStinson who recommends close outpatient follow-up in the clinic.  The clinic will contact the patient and schedule this appointment.  Patient was initially hypertensive at 156, her blood pressure was reduced to 148/99.  No indication for hypertensive management at this time as her blood pressure is not over 155.  Patient will have close follow-up and will be managed as an outpatient.  She is given strict return precautions, she verbalized understanding and agreement to today's plan had no further questions or concerns.     ED Discharge Orders        Ordered    Doxylamine-Pyridoxine 10-10 MG TBEC  Daily at bedtime     11/24/17 1643       HedgesTinnie Gens, Naijah Lacek, Cordelia Poche-C 11/24/17 1647    Bethann BerkshireZammit, Joseph, MD 11/25/17 1008

## 2017-11-24 NOTE — ED Triage Notes (Signed)
Pt presents for evaluation of n/v/d. Pt reports hx of hypertension, not currently taking medications. Pt reports loose stools, denies bloody/black stools. Denies abd pain.

## 2017-11-24 NOTE — ED Notes (Signed)
Pt verbalized understanding of all d/c instructions, prescriptions, and f/u information. Opportunity for questioning and answers provided. VSS. All belongings with patient at this time. Pt ambulatory to lobby with steady gait.   

## 2017-11-24 NOTE — Discharge Instructions (Signed)
Please read attached information. If you experience any new or worsening signs or symptoms please return to the emergency room for evaluation. Please follow-up with your primary care provider or specialist as discussed. Please use medication prescribed only as directed and discontinue taking if you have any concerning signs or symptoms.   °

## 2017-11-27 ENCOUNTER — Other Ambulatory Visit: Payer: Self-pay | Admitting: *Deleted

## 2017-11-27 DIAGNOSIS — O3680X0 Pregnancy with inconclusive fetal viability, not applicable or unspecified: Secondary | ICD-10-CM

## 2017-11-27 NOTE — Progress Notes (Signed)
Per Dr. Adrian BlackwaterStinson, pt needs US on 4/12 to assess viability and progress of pregnancy. I called pt and informed her of appt on 4/12 @ 1000 (arrive @ 0945) for US.  Pt agreed and voiced understanding.

## 2017-12-04 ENCOUNTER — Ambulatory Visit (INDEPENDENT_AMBULATORY_CARE_PROVIDER_SITE_OTHER): Payer: Medicaid Other | Admitting: General Practice

## 2017-12-04 ENCOUNTER — Ambulatory Visit (HOSPITAL_COMMUNITY)
Admission: RE | Admit: 2017-12-04 | Discharge: 2017-12-04 | Disposition: A | Discharge status: Home / Self Care | Payer: Medicaid Other | Source: Ambulatory Visit | Attending: Family Medicine | Admitting: Family Medicine

## 2017-12-04 DIAGNOSIS — O3680X0 Pregnancy with inconclusive fetal viability, not applicable or unspecified: Secondary | ICD-10-CM | POA: Diagnosis not present

## 2017-12-04 DIAGNOSIS — Z712 Person consulting for explanation of examination or test findings: Secondary | ICD-10-CM

## 2017-12-04 DIAGNOSIS — Z3A01 Less than 8 weeks gestation of pregnancy: Secondary | ICD-10-CM | POA: Insufficient documentation

## 2017-12-04 NOTE — Progress Notes (Signed)
Patient here for viability results today. Reviewed results with Vonzella NippleJulie Wenzel who finds living IUP- patient should begin prenatal care.  Informed patient of results, provided pictures, and reviewed dating. Patient verbalized understanding & had no questions.

## 2017-12-24 ENCOUNTER — Ambulatory Visit (INDEPENDENT_AMBULATORY_CARE_PROVIDER_SITE_OTHER): Payer: Medicaid Other | Admitting: Family Medicine

## 2017-12-24 ENCOUNTER — Encounter: Payer: Self-pay | Admitting: Family Medicine

## 2017-12-24 ENCOUNTER — Other Ambulatory Visit (HOSPITAL_COMMUNITY)
Admission: RE | Admit: 2017-12-24 | Discharge: 2017-12-24 | Disposition: A | Discharge status: Home / Self Care | Payer: Medicaid Other | Source: Ambulatory Visit | Attending: Family Medicine | Admitting: Family Medicine

## 2017-12-24 DIAGNOSIS — O0991 Supervision of high risk pregnancy, unspecified, first trimester: Secondary | ICD-10-CM | POA: Diagnosis not present

## 2017-12-24 DIAGNOSIS — O10919 Unspecified pre-existing hypertension complicating pregnancy, unspecified trimester: Secondary | ICD-10-CM | POA: Diagnosis not present

## 2017-12-24 DIAGNOSIS — O0990 Supervision of high risk pregnancy, unspecified, unspecified trimester: Secondary | ICD-10-CM | POA: Diagnosis present

## 2017-12-24 DIAGNOSIS — Z3A09 9 weeks gestation of pregnancy: Secondary | ICD-10-CM | POA: Insufficient documentation

## 2017-12-24 DIAGNOSIS — O10911 Unspecified pre-existing hypertension complicating pregnancy, first trimester: Secondary | ICD-10-CM

## 2017-12-24 HISTORY — DX: Unspecified pre-existing hypertension complicating pregnancy, unspecified trimester: O10.919

## 2017-12-24 MED ORDER — DOXYLAMINE-PYRIDOXINE 10-10 MG PO TBEC: 1.0000 | DELAYED_RELEASE_TABLET | Freq: Every day | ORAL | 0 refills | Status: DC

## 2017-12-24 NOTE — Progress Notes (Signed)
Subjective:  [name] is a G4W1027 [redacted]w[redacted]d being seen today for her first obstetrical visit.  Her obstetrical history is significant for [facility] in pregnancy. Patient does intend to breast feed. Pregnancy history fully reviewed.  Patient reports no complaints.  BP 117/71   Pulse 88   Wt 216 lb (98 kg)   LMP 10/13/2017 (Approximate)   BMI 39.51 kg/m   HISTORY: OB History  Gravida Para Term Preterm AB Living  SAB TAB Ectopic Multiple Live Births        0 3    # Outcome Date GA Lbr Len/2nd Weight Sex Delivery Anes PTL Lv  4 Current           3 Term 01/01/16 [redacted]w[redacted]d 75:36 / 00:09 7 lb 2.3 oz (3.24 kg) F Vag-Spont EPI  LIV  2 Term 12/21/07 [redacted]w[redacted]d  7 lb 11 oz (3.487 kg) M Vag-Spont EPI  LIV  1 Term 08/23/05 [redacted]w[redacted]d  7 lb 7 oz (3.374 kg) F Vag-Spont EPI  LIV    Past Medical History:  Diagnosis Date  . Anemia   . Hypertension     Past Surgical History:  Procedure Laterality Date  . NO PAST SURGERIES      Family History  Problem Relation Age of Onset  . Hypertension Mother      Exam    Uterus:     Pelvic Exam:    Perineum: No Hemorrhoids, Normal Perineum   Vulva: Bartholin's, Urethra, Skene's normal   Vagina:  normal mucosa   Cervix: multiparous appearance   Adnexa: normal adnexa and no mass, fullness, tenderness   Bony Pelvis: gynecoid  System: Breast:  normal appearance, no masses or tenderness, Inspection negative, No nipple retraction or dimpling, No nipple discharge or bleeding, No axillary or supraclavicular adenopathy, Normal to palpation without dominant masses   Skin: normal coloration and turgor, no rashes    Neurologic: gait normal; reflexes normal and symmetric   Extremities: normal strength, tone, and muscle mass   HEENT PERRLA and extra ocular movement intact   Mouth/Teeth mucous membranes moist, pharynx normal without lesions   Neck supple and no masses   Cardiovascular: regular rate and rhythm, no murmurs or gallops   Respiratory:   appears well, vitals normal, no respiratory distress, acyanotic, normal RR, ear and throat exam is normal, neck free of mass or lymphadenopathy, chest clear, no wheezing, crepitations, rhonchi, normal symmetric air entry   Abdomen: soft, non-tender; bowel sounds normal; no masses,  no organomegaly   Urinary: urethral meatus normal      Assessment:    Pregnancy: [id number] Patient Active Problem List   Diagnosis Date Noted  . Supervision of high risk pregnancy, antepartum 12/24/2017  . Chronic hypertension during pregnancy, antepartum 12/24/2017      Plan:   1. Supervision of high risk pregnancy, antepartum Genetic Screening discussed Quad Screen: requested.  Ultrasound discussed; fetal survey: requested.  Follow up in 4 weeks.  - CBC+Hgb PA - Obstetric Panel, Including HIV - Cytology - PAP - Cystic Fibrosis Mutation 97 - TSH - Protein / creatinine ratio, urine - Comprehensive metabolic panel  2. Chronic hypertension during pregnancy, antepartum Discussed high risk pregnancy. [place] q4 wks after 20 weeks. Antenatal testing after 32 weeks. - CBC+Hgb PA - Obstetric Panel, Including HIV - Cytology - PAP - Cystic Fibrosis Mutation 97 - TSH - Protein / creatinine ratio, urine - Comprehensive metabolic panel     Problem list  reviewed and updated. 75% of 45 min visit spent on counseling and coordination of care.     Levie Heritage 12/24/2017

## 2017-12-24 NOTE — Progress Notes (Signed)
Bedside ultrasound performed for FHR. FHR 152bpm.

## 2017-12-24 NOTE — Progress Notes (Signed)
New OB packet given  Flu vaccine declined

## 2017-12-25 LAB — PROTEIN / CREATININE RATIO, URINE
CREATININE, UR: 163.5 mg/dL
PROTEIN/CREAT RATIO: 142 mg/g{creat} (ref 0–200)
Protein, Ur: 23.2 mg/dL

## 2017-12-28 LAB — CYTOLOGY - PAP
CHLAMYDIA, DNA PROBE: NEGATIVE
DIAGNOSIS: NEGATIVE
HPV (WINDOPATH): NOT DETECTED

## 2017-12-29 ENCOUNTER — Other Ambulatory Visit: Payer: Self-pay | Admitting: General Practice

## 2017-12-29 DIAGNOSIS — O0990 Supervision of high risk pregnancy, unspecified, unspecified trimester: Secondary | ICD-10-CM

## 2017-12-29 LAB — COMPREHENSIVE METABOLIC PANEL
ALBUMIN: 4.1 g/dL (ref 3.5–5.5)
ALK PHOS: 41 IU/L (ref 39–117)
ALT: 12 IU/L (ref 0–32)
AST: 15 IU/L (ref 0–40)
Albumin/Globulin Ratio: 1.6 (ref 1.2–2.2)
BILIRUBIN TOTAL: 0.4 mg/dL (ref 0.0–1.2)
BUN / CREAT RATIO: 11 (ref 9–23)
BUN: 6 mg/dL (ref 6–20)
CO2: 21 mmol/L (ref 20–29)
CREATININE: 0.53 mg/dL — AB (ref 0.57–1.00)
Calcium: 9 mg/dL (ref 8.7–10.2)
Chloride: 102 mmol/L (ref 96–106)
GFR calc Af Amer: 146 mL/min/{1.73_m2} (ref >59)
GFR calc non Af Amer: 127 mL/min/{1.73_m2} (ref >59)
GLOBULIN, TOTAL: 2.5 g/dL (ref 1.5–4.5)
GLUCOSE: 76 mg/dL (ref 65–99)
Potassium: 4.2 mmol/L (ref 3.5–5.2)
Sodium: 139 mmol/L (ref 134–144)
Total Protein: 6.6 g/dL (ref 6.0–8.5)

## 2017-12-29 LAB — OBSTETRIC PANEL, INCLUDING HIV
ANTIBODY SCREEN: NEGATIVE
BASOS: 0 %
Basophils Absolute: 0 10*3/uL (ref 0.0–0.2)
EOS (ABSOLUTE): 0.1 10*3/uL (ref 0.0–0.4)
Eos: 1 %
HEMATOCRIT: 32.7 % — AB (ref 34.0–46.6)
HIV Screen 4th Generation wRfx: NONREACTIVE
Hemoglobin: 10.8 g/dL — ABNORMAL LOW (ref 11.1–15.9)
Hepatitis B Surface Ag: NEGATIVE
IMMATURE GRANS (ABS): 0 10*3/uL (ref 0.0–0.1)
Immature Granulocytes: 0 %
LYMPHS ABS: 2.1 10*3/uL (ref 0.7–3.1)
Lymphs: 24 %
MCH: 29.9 pg (ref 26.6–33.0)
MCHC: 33 g/dL (ref 31.5–35.7)
MCV: 91 fL (ref 79–97)
MONOCYTES: 6 %
Monocytes Absolute: 0.5 10*3/uL (ref 0.1–0.9)
NEUTROS ABS: 5.8 10*3/uL (ref 1.4–7.0)
Neutrophils: 69 %
PLATELETS: 350 10*3/uL (ref 150–379)
RBC: 3.61 x10E6/uL — AB (ref 3.77–5.28)
RDW: 13.4 % (ref 12.3–15.4)
RPR Ser Ql: NONREACTIVE
Rh Factor: POSITIVE
Rubella Antibodies, IGG: 8.17 index (ref >0.99)
WBC: 8.5 10*3/uL (ref 3.4–10.8)

## 2017-12-29 LAB — CBC+HGB PA
HGB A: 97.8 % (ref 96.4–98.8)
HGB C: 0 %
HGB F QUANT: 0 % (ref 0.0–2.0)
HGB S: 0 %
HGB SOLUBILITY: NEGATIVE
Hgb A2 Quant: 2.2 % (ref 1.8–3.2)
Hgb Variant: 0 %

## 2017-12-29 LAB — TSH: TSH: 0.034 u[IU]/mL — ABNORMAL LOW (ref 0.450–4.500)

## 2017-12-29 LAB — CYSTIC FIBROSIS MUTATION 97: GENE DIS ANAL CARRIER INTERP BLD/T-IMP: NOT DETECTED

## 2017-12-29 MED ORDER — PRENATAL 19 PO TABS: 1.0000 | ORAL_TABLET | Freq: Every day | ORAL | 11 refills | Status: DC

## 2017-12-30 ENCOUNTER — Other Ambulatory Visit: Payer: Self-pay | Admitting: Family Medicine

## 2017-12-30 ENCOUNTER — Encounter: Payer: Self-pay | Admitting: Family Medicine

## 2017-12-30 LAB — SPECIMEN STATUS REPORT

## 2017-12-30 LAB — T4, FREE: Free T4: 1.21 ng/dL (ref 0.82–1.77)

## 2017-12-30 LAB — T3, FREE: T3, Free: 4.1 pg/mL (ref 2.0–4.4)

## 2017-12-31 ENCOUNTER — Other Ambulatory Visit: Payer: Self-pay | Admitting: General Practice

## 2017-12-31 DIAGNOSIS — O219 Vomiting of pregnancy, unspecified: Secondary | ICD-10-CM

## 2017-12-31 DIAGNOSIS — O10919 Unspecified pre-existing hypertension complicating pregnancy, unspecified trimester: Secondary | ICD-10-CM

## 2017-12-31 MED ORDER — PROMETHAZINE HCL 25 MG PO TABS: 25.0000 mg | ORAL_TABLET | Freq: Four times a day (QID) | ORAL | 0 refills | Status: DC | PRN

## 2018-01-04 ENCOUNTER — Telehealth: Payer: Self-pay | Admitting: *Deleted

## 2018-01-04 NOTE — Telephone Encounter (Signed)
[name] called and left a message 12/30/17 her PNV and nausea meds were not at [facility] on [address] when she went to get them. Verified rx were sent. Called patient and she states she has not gotten both of them. No other concerns.

## 2018-01-25 ENCOUNTER — Ambulatory Visit (INDEPENDENT_AMBULATORY_CARE_PROVIDER_SITE_OTHER): Payer: Medicaid Other | Admitting: Family Medicine

## 2018-01-25 ENCOUNTER — Encounter: Payer: Self-pay | Admitting: Family Medicine

## 2018-01-25 VITALS — BP 118/79 | HR 93 | Wt 219.0 lb

## 2018-01-25 DIAGNOSIS — R05 Cough: Secondary | ICD-10-CM

## 2018-01-25 DIAGNOSIS — O0990 Supervision of high risk pregnancy, unspecified, unspecified trimester: Secondary | ICD-10-CM

## 2018-01-25 DIAGNOSIS — R7989 Other specified abnormal findings of blood chemistry: Secondary | ICD-10-CM

## 2018-01-25 DIAGNOSIS — O10919 Unspecified pre-existing hypertension complicating pregnancy, unspecified trimester: Secondary | ICD-10-CM

## 2018-01-25 DIAGNOSIS — R059 Cough, unspecified: Secondary | ICD-10-CM

## 2018-01-25 MED ORDER — BENZONATATE 100 MG PO CAPS: 100.0000 mg | ORAL_CAPSULE | Freq: Three times a day (TID) | ORAL | 2 refills | Status: DC | PRN

## 2018-01-25 NOTE — Progress Notes (Signed)
   PRENATAL VISIT NOTE  Subjective:  [name] is a 32 y.o. G4P3003 at 2079w1d being seen today for ongoing prenatal care.  She is currently monitored for the following issues for this high-risk pregnancy and has Supervision of high risk pregnancy, antepartum and Chronic hypertension during pregnancy, antepartum on their problem list.  Patient reports continued cough. Mild production. No fevers, chills, sinus pain. Sometimes gets headache because of coughing so much..  Contractions: Not present. Vag. Bleeding: None.  Movement: Absent. Denies leaking of fluid.   The following portions of the patient's history were reviewed and updated as appropriate: allergies, current medications, past family history, past medical history, past social history, past surgical history and problem list. Problem list updated.  Objective:   Vitals:   01/25/18 1624  BP: 118/79  Pulse: 93  Weight: 219 lb (99.3 kg)    Fetal Status: Fetal Heart Rate (bpm): 154   Movement: Absent     General:  Alert, oriented and cooperative. Patient is in no acute distress.  Skin: Skin is warm and dry. No rash noted.   Cardiovascular: Normal heart rate noted  Respiratory: Normal respiratory effort, no problems with respiration noted  Abdomen: Soft, gravid, appropriate for gestational age.  Pain/Pressure: Absent     Pelvic: Cervical exam deferred        Extremities: Normal range of motion.  Edema: None  Mental Status: Normal mood and affect. Normal behavior. Normal judgment and thought content.   Assessment and Plan:  Pregnancy: G4P3003 at 5079w1d  1. Supervision of high risk pregnancy, antepartum FHT and FH normal  2. Chronic hypertension during pregnancy, antepartum BP controlled. ASA 81mg  daily. - US MFM OB DETAIL +14 WK; Future  3. Low TSH level recheck - TSH - T3, free  4. Cough [place].  Preterm labor symptoms and general obstetric precautions including but not limited to vaginal bleeding, contractions,  leaking of fluid and fetal movement were reviewed in detail with the patient. Please refer to After Visit Summary for other counseling recommendations.  Return in about 1 month (around 02/22/2018) for HR OB f/u.  Future Appointments  Date Time Provider Department Center  03/02/2018  2:00 PM [facility] US 3 [facility]    Levie HeritageJacob J Carlee Tesfaye, DO

## 2018-01-26 LAB — T3, FREE: T3, Free: 3.2 pg/mL (ref 2.0–4.4)

## 2018-01-26 LAB — TSH: TSH: 0.239 u[IU]/mL — ABNORMAL LOW (ref 0.450–4.500)

## 2018-01-26 MED ORDER — ASPIRIN 81 MG PO TABS: 81.0000 mg | ORAL_TABLET | Freq: Every day | ORAL | 6 refills | Status: DC

## 2018-02-22 ENCOUNTER — Ambulatory Visit (INDEPENDENT_AMBULATORY_CARE_PROVIDER_SITE_OTHER): Payer: Medicaid Other | Admitting: Family Medicine

## 2018-02-22 VITALS — BP 126/62 | HR 82 | Wt 214.7 lb

## 2018-02-22 DIAGNOSIS — O0990 Supervision of high risk pregnancy, unspecified, unspecified trimester: Secondary | ICD-10-CM

## 2018-02-22 DIAGNOSIS — O10919 Unspecified pre-existing hypertension complicating pregnancy, unspecified trimester: Secondary | ICD-10-CM

## 2018-02-22 MED ORDER — FERROUS GLUCONATE 324 (38 FE) MG PO TABS: 324.0000 mg | ORAL_TABLET | Freq: Two times a day (BID) | ORAL | 3 refills | Status: DC

## 2018-02-22 NOTE — Progress Notes (Signed)
   PRENATAL VISIT NOTE  Subjective:  [name] is a 32 y.o. G4P3003 at 1351w1d being seen today for ongoing prenatal care.  She is currently monitored for the following issues for this high-risk pregnancy and has Supervision of high risk pregnancy, antepartum and Chronic hypertension during pregnancy, antepartum on their problem list.  Patient reports no complaints.  Contractions: Not present. Vag. Bleeding: None.  Movement: Present. Denies leaking of fluid.   The following portions of the patient's history were reviewed and updated as appropriate: allergies, current medications, past family history, past medical history, past social history, past surgical history and problem list. Problem list updated.  Objective:   Vitals:   02/22/18 1544  BP: 126/62  Pulse: 82  Weight: 214 lb 11.2 oz (97.4 kg)    Fetal Status: Fetal Heart Rate (bpm): 159 Fundal Height: 18 cm Movement: Present     General:  Alert, oriented and cooperative. Patient is in no acute distress.  Skin: Skin is warm and dry. No rash noted.   Cardiovascular: Normal heart rate noted  Respiratory: Normal respiratory effort, no problems with respiration noted  Abdomen: Soft, gravid, appropriate for gestational age.  Pain/Pressure: Present     Pelvic: Cervical exam deferred        Extremities: Normal range of motion.  Edema: Trace  Mental Status: Normal mood and affect. Normal behavior. Normal judgment and thought content.   Assessment and Plan:  Pregnancy: G4P3003 at 6651w1d  1. Supervision of high risk pregnancy, antepartum FHT and FH normal. Quad screen today  2. Chronic hypertension during pregnancy, antepartum BP controlled. Detailed US next week. Taking ASA 81mg .  Growth US q4wks  Preterm labor symptoms and general obstetric precautions including but not limited to vaginal bleeding, contractions, leaking of fluid and fetal movement were reviewed in detail with the patient. Please refer to After Visit Summary for  other counseling recommendations.  No follow-ups on file.  Future Appointments  Date Time Provider Department Center  03/02/2018  2:00 PM [facility] US 3 [facility]    Levie HeritageJacob J Rochella Benner, DO

## 2018-02-23 ENCOUNTER — Encounter (HOSPITAL_COMMUNITY): Payer: Self-pay

## 2018-02-24 LAB — AFP TETRA
DIA Mom Value: 0.71
DIA Value (EIA): 99.49 pg/mL
DSR (BY AGE) 1 IN: 485
DSR (SECOND TRIMESTER) 1 IN: 10000
GESTATIONAL AGE AFP: 18 wk
MSAFP Mom: 1.24
MSAFP: 46.2 ng/mL
MSHCG MOM: 0.76
MSHCG: 17707 m[IU]/mL
Maternal Age At EDD: 32.5 yr
OSB RISK: 10000
T18 (By Age): 1:1889 {titer}
TEST RESULTS AFP: NEGATIVE
WEIGHT: 214 [lb_av]
uE3 Mom: 1.19
uE3 Value: 1.39 ng/mL

## 2018-03-02 ENCOUNTER — Encounter (HOSPITAL_COMMUNITY): Payer: Self-pay

## 2018-03-02 ENCOUNTER — Other Ambulatory Visit: Payer: Self-pay | Admitting: Family Medicine

## 2018-03-02 ENCOUNTER — Ambulatory Visit (HOSPITAL_COMMUNITY)
Admission: RE | Admit: 2018-03-02 | Discharge: 2018-03-02 | Disposition: A | Discharge status: Home / Self Care | Payer: Medicaid Other | Source: Ambulatory Visit | Attending: Family Medicine | Admitting: Family Medicine

## 2018-03-02 ENCOUNTER — Encounter: Payer: Self-pay | Admitting: Family Medicine

## 2018-03-02 DIAGNOSIS — O10919 Unspecified pre-existing hypertension complicating pregnancy, unspecified trimester: Secondary | ICD-10-CM

## 2018-03-02 DIAGNOSIS — Z3689 Encounter for other specified antenatal screening: Secondary | ICD-10-CM

## 2018-03-02 DIAGNOSIS — O10913 Unspecified pre-existing hypertension complicating pregnancy, third trimester: Secondary | ICD-10-CM | POA: Diagnosis not present

## 2018-03-02 DIAGNOSIS — Z363 Encounter for antenatal screening for malformations: Secondary | ICD-10-CM | POA: Diagnosis not present

## 2018-03-02 DIAGNOSIS — O10012 Pre-existing essential hypertension complicating pregnancy, second trimester: Secondary | ICD-10-CM | POA: Insufficient documentation

## 2018-03-02 DIAGNOSIS — Z3A19 19 weeks gestation of pregnancy: Secondary | ICD-10-CM

## 2018-03-03 ENCOUNTER — Other Ambulatory Visit (HOSPITAL_COMMUNITY): Payer: Self-pay | Admitting: *Deleted

## 2018-03-03 DIAGNOSIS — O10919 Unspecified pre-existing hypertension complicating pregnancy, unspecified trimester: Secondary | ICD-10-CM

## 2018-03-10 ENCOUNTER — Encounter: Payer: Self-pay | Admitting: Family Medicine

## 2018-03-22 ENCOUNTER — Ambulatory Visit (INDEPENDENT_AMBULATORY_CARE_PROVIDER_SITE_OTHER): Payer: Medicaid Other | Admitting: Obstetrics & Gynecology

## 2018-03-22 DIAGNOSIS — O219 Vomiting of pregnancy, unspecified: Secondary | ICD-10-CM

## 2018-03-22 DIAGNOSIS — O0990 Supervision of high risk pregnancy, unspecified, unspecified trimester: Secondary | ICD-10-CM

## 2018-03-22 DIAGNOSIS — O10919 Unspecified pre-existing hypertension complicating pregnancy, unspecified trimester: Secondary | ICD-10-CM

## 2018-03-22 MED ORDER — PROMETHAZINE HCL 25 MG PO TABS: 25.0000 mg | ORAL_TABLET | Freq: Four times a day (QID) | ORAL | 0 refills | Status: DC | PRN

## 2018-03-22 MED ORDER — PRENATAL 19 PO TABS: 1.0000 | ORAL_TABLET | Freq: Every day | ORAL | 11 refills | Status: DC

## 2018-03-22 MED ORDER — ASPIRIN 81 MG PO TABS: 81.0000 mg | ORAL_TABLET | Freq: Every day | ORAL | 6 refills | Status: DC

## 2018-03-22 NOTE — Progress Notes (Signed)
   PRENATAL VISIT NOTE  Subjective:  [name] is a 32 y.o. G4P3003 at 4740w1d being seen today for ongoing prenatal care.  She is currently monitored for the following issues for this high-risk pregnancy and has Supervision of high risk pregnancy, antepartum and Chronic hypertension during pregnancy, antepartum on their problem list.  Patient reports backache.  Contractions: Not present. Vag. Bleeding: None.  Movement: Present. Denies leaking of fluid.   The following portions of the patient's history were reviewed and updated as appropriate: allergies, current medications, past family history, past medical history, past social history, past surgical history and problem list. Problem list updated.  Objective:   Vitals:   03/22/18 1114  Weight: 213 lb 9.6 oz (96.9 kg)    Fetal Status: Fetal Heart Rate (bpm): 156   Movement: Present     General:  Alert, oriented and cooperative. Patient is in no acute distress.  Skin: Skin is warm and dry. No rash noted.   Cardiovascular: Normal heart rate noted  Respiratory: Normal respiratory effort, no problems with respiration noted  Abdomen: Soft, gravid, appropriate for gestational age.  Pain/Pressure: Present     Pelvic: Cervical exam deferred        Extremities: Normal range of motion.  Edema: Trace  Mental Status: Normal mood and affect. Normal behavior. Normal judgment and thought content.   Assessment and Plan:  Pregnancy: G4P3003 at 9040w1d  1. Supervision of high risk pregnancy, antepartum -refills given for ASA, PNV, phenergan -Cont iron LBP on right is MSK--heating pack, pregnancy belt, stretches, massage No CVA tenderness BTL consent at next visit   2. Chronic hypertension during pregnancy, antepartum Nml BP today; US for growth scheduled RN BP check in 2 weeks; MD in 4 weeks  Preterm labor symptoms and general obstetric precautions including but not limited to vaginal bleeding, contractions, leaking of fluid and fetal  movement were reviewed in detail with the patient. Please refer to After Visit Summary for other counseling recommendations.  Return in about 1 month (around 04/19/2018).  Future Appointments  Date Time Provider Department Center  03/31/2018  2:15 PM [facility] US 4 [facility]    Elsie LincolnKelly Elwyn Klosinski, MD

## 2018-03-31 ENCOUNTER — Encounter (HOSPITAL_COMMUNITY): Payer: Self-pay

## 2018-03-31 ENCOUNTER — Other Ambulatory Visit (HOSPITAL_COMMUNITY): Payer: Self-pay | Admitting: Obstetrics and Gynecology

## 2018-03-31 ENCOUNTER — Ambulatory Visit (HOSPITAL_COMMUNITY)
Admission: RE | Admit: 2018-03-31 | Discharge: 2018-03-31 | Disposition: A | Discharge status: Home / Self Care | Payer: Medicaid Other | Source: Ambulatory Visit | Attending: Family Medicine | Admitting: Family Medicine

## 2018-03-31 DIAGNOSIS — O99212 Obesity complicating pregnancy, second trimester: Secondary | ICD-10-CM | POA: Insufficient documentation

## 2018-03-31 DIAGNOSIS — Z3A23 23 weeks gestation of pregnancy: Secondary | ICD-10-CM | POA: Insufficient documentation

## 2018-03-31 DIAGNOSIS — Z362 Encounter for other antenatal screening follow-up: Secondary | ICD-10-CM

## 2018-03-31 DIAGNOSIS — O10912 Unspecified pre-existing hypertension complicating pregnancy, second trimester: Secondary | ICD-10-CM

## 2018-03-31 DIAGNOSIS — O10919 Unspecified pre-existing hypertension complicating pregnancy, unspecified trimester: Secondary | ICD-10-CM | POA: Diagnosis not present

## 2018-04-01 ENCOUNTER — Other Ambulatory Visit (HOSPITAL_COMMUNITY): Payer: Self-pay | Admitting: *Deleted

## 2018-04-01 DIAGNOSIS — O10919 Unspecified pre-existing hypertension complicating pregnancy, unspecified trimester: Secondary | ICD-10-CM

## 2018-04-05 ENCOUNTER — Encounter: Payer: Self-pay | Admitting: *Deleted

## 2018-04-05 ENCOUNTER — Ambulatory Visit (INDEPENDENT_AMBULATORY_CARE_PROVIDER_SITE_OTHER): Payer: Medicaid Other | Admitting: *Deleted

## 2018-04-05 VITALS — BP 119/62 | HR 75 | Wt 218.3 lb

## 2018-04-05 DIAGNOSIS — O0990 Supervision of high risk pregnancy, unspecified, unspecified trimester: Secondary | ICD-10-CM

## 2018-04-05 DIAGNOSIS — Z0130 Encounter for examination of blood pressure without abnormal findings: Secondary | ICD-10-CM

## 2018-04-05 DIAGNOSIS — O10919 Unspecified pre-existing hypertension complicating pregnancy, unspecified trimester: Secondary | ICD-10-CM

## 2018-04-05 NOTE — Progress Notes (Signed)
Here for bp check which was wnl. Instructed to keep next appt which was reviewed with her. Also advised to go to [facility] if severe headache, sudden edema, etc. She asked for pregnancy restrictions letter which was given.

## 2018-04-06 NOTE — Progress Notes (Signed)
I have reviewed the chart and agree with nursing staff's documentation of this patient's encounter.  Elsie LincolnKelly Phoenix Riesen, MD 04/06/2018 4:15 PM

## 2018-04-21 ENCOUNTER — Ambulatory Visit (INDEPENDENT_AMBULATORY_CARE_PROVIDER_SITE_OTHER): Payer: Medicaid Other | Admitting: Obstetrics and Gynecology

## 2018-04-21 VITALS — BP 121/68 | HR 93 | Wt 216.1 lb

## 2018-04-21 DIAGNOSIS — O0990 Supervision of high risk pregnancy, unspecified, unspecified trimester: Secondary | ICD-10-CM

## 2018-04-21 DIAGNOSIS — O10919 Unspecified pre-existing hypertension complicating pregnancy, unspecified trimester: Secondary | ICD-10-CM

## 2018-04-21 NOTE — Progress Notes (Signed)
Prenatal Visit Note Date: 04/21/2018 Clinic: [facility]  Subjective:  [name] is a 32 y.o. [id number] at 6222w3d being seen today for ongoing prenatal care.  She is currently monitored for the following issues for this high-risk pregnancy and has Supervision of high risk pregnancy, antepartum and Chronic hypertension during pregnancy, antepartum on their problem list.  Patient reports no complaints.   Contractions: Not present. Vag. Bleeding: None.  Movement: Present. Denies leaking of fluid.   The following portions of the patient's history were reviewed and updated as appropriate: allergies, current medications, past family history, past medical history, past social history, past surgical history and problem list. Problem list updated.  Objective:   Vitals:   04/21/18 1321  BP: 121/68  Pulse: 93  Weight: 216 lb 1.6 oz (98 kg)    Fetal Status: Fetal Heart Rate (bpm): 151   Movement: Present     General:  Alert, oriented and cooperative. Patient is in no acute distress.  Skin: Skin is warm and dry. No rash noted.   Cardiovascular: Normal heart rate noted  Respiratory: Normal respiratory effort, no problems with respiration noted  Abdomen: Soft, gravid, appropriate for gestational age. Pain/Pressure: Absent     Pelvic:  Cervical exam deferred        Extremities: Normal range of motion.  Edema: None  Mental Status: Normal mood and affect. Normal behavior. Normal judgment and thought content.   Urinalysis:      Assessment and Plan:  Pregnancy: G4P3003 at 6522w3d  1. Supervision of high risk pregnancy, antepartum Routine care. 28wk labs nv  2. Chronic hypertension during pregnancy, antepartum Doing well only on low dose asa. Normal surveillance growth scans with rpt already scheduled. Start ap testing at 32wks.   Preterm labor symptoms and general obstetric precautions including but not limited to vaginal bleeding, contractions, leaking of fluid and fetal  movement were reviewed in detail with the patient. Please refer to After Visit Summary for other counseling recommendations.  Return in about 2 weeks (around 05/05/2018) for 10-14d 2hr GTT, hrob.   [place] BingPickens, Rubbie Goostree, MD

## 2018-04-28 ENCOUNTER — Ambulatory Visit (HOSPITAL_COMMUNITY)
Admission: RE | Admit: 2018-04-28 | Discharge: 2018-04-28 | Disposition: A | Discharge status: Home / Self Care | Payer: Medicaid Other | Source: Ambulatory Visit | Attending: Family Medicine | Admitting: Family Medicine

## 2018-04-28 ENCOUNTER — Encounter (HOSPITAL_COMMUNITY): Payer: Self-pay

## 2018-04-28 DIAGNOSIS — Z362 Encounter for other antenatal screening follow-up: Secondary | ICD-10-CM | POA: Diagnosis present

## 2018-04-28 DIAGNOSIS — Z3A27 27 weeks gestation of pregnancy: Secondary | ICD-10-CM | POA: Diagnosis not present

## 2018-04-28 DIAGNOSIS — O321XX0 Maternal care for breech presentation, not applicable or unspecified: Secondary | ICD-10-CM | POA: Insufficient documentation

## 2018-04-28 DIAGNOSIS — O10012 Pre-existing essential hypertension complicating pregnancy, second trimester: Secondary | ICD-10-CM | POA: Diagnosis not present

## 2018-04-28 DIAGNOSIS — O10919 Unspecified pre-existing hypertension complicating pregnancy, unspecified trimester: Secondary | ICD-10-CM

## 2018-05-05 ENCOUNTER — Other Ambulatory Visit (HOSPITAL_COMMUNITY): Payer: Self-pay | Admitting: *Deleted

## 2018-05-05 DIAGNOSIS — O10913 Unspecified pre-existing hypertension complicating pregnancy, third trimester: Secondary | ICD-10-CM

## 2018-05-06 ENCOUNTER — Ambulatory Visit (INDEPENDENT_AMBULATORY_CARE_PROVIDER_SITE_OTHER): Payer: Medicaid Other | Admitting: Obstetrics & Gynecology

## 2018-05-06 ENCOUNTER — Other Ambulatory Visit: Payer: Self-pay | Admitting: General Practice

## 2018-05-06 ENCOUNTER — Encounter: Payer: Self-pay | Admitting: Obstetrics & Gynecology

## 2018-05-06 ENCOUNTER — Other Ambulatory Visit: Payer: Medicaid Other

## 2018-05-06 VITALS — BP 121/68 | HR 89 | Wt 213.5 lb

## 2018-05-06 DIAGNOSIS — O0990 Supervision of high risk pregnancy, unspecified, unspecified trimester: Secondary | ICD-10-CM

## 2018-05-06 DIAGNOSIS — O0993 Supervision of high risk pregnancy, unspecified, third trimester: Secondary | ICD-10-CM | POA: Diagnosis not present

## 2018-05-06 DIAGNOSIS — Z23 Encounter for immunization: Secondary | ICD-10-CM

## 2018-05-06 NOTE — Progress Notes (Signed)
   PRENATAL VISIT NOTE  Subjective:  [name] is a 32 y.o. G4P3003 at 1665w4d being seen today for ongoing prenatal care.  She is currently monitored for the following issues for this low-risk pregnancy and has Supervision of high risk pregnancy, antepartum and Chronic hypertension during pregnancy, antepartum on their problem list.  Patient reports no complaints.  Contractions: Not present. Vag. Bleeding: None.  Movement: Present. Denies leaking of fluid.   The following portions of the patient's history were reviewed and updated as appropriate: allergies, current medications, past family history, past medical history, past social history, past surgical history and problem list. Problem list updated.  Objective:   Vitals:   05/06/18 0908  BP: 121/68  Pulse: 89  Weight: 213 lb 8 oz (96.8 kg)    Fetal Status: Fetal Heart Rate (bpm): 139   Movement: Present     General:  Alert, oriented and cooperative. Patient is in no acute distress.  Skin: Skin is warm and dry. No rash noted.   Cardiovascular: Normal heart rate noted  Respiratory: Normal respiratory effort, no problems with respiration noted  Abdomen: Soft, gravid, appropriate for gestational age.  Pain/Pressure: Present     Pelvic: Cervical exam deferred        Extremities: Normal range of motion.     Mental Status: Normal mood and affect. Normal behavior. Normal judgment and thought content.   Assessment and Plan:  Pregnancy: G4P3003 at 8865w4d  1. Supervision of high risk pregnancy, antepartum -28 week labs today - TDAP today - She declines flu vaccine - BTL papers signed today - f/u MFM u/s 05/26/18  Preterm labor symptoms and general obstetric precautions including but not limited to vaginal bleeding, contractions, leaking of fluid and fetal movement were reviewed in detail with the patient. Please refer to After Visit Summary for other counseling recommendations.  Return in about 2 weeks (around 05/20/2018).  Future  Appointments  Date Time Provider Department Center  05/06/2018  9:35 AM Allie Bossierove, Coretha Creswell C, MD [facility]  05/26/2018  3:30 PM [facility] US 1 [facility]    Allie BossierMyra C Haddy Mullinax, MD

## 2018-05-06 NOTE — Addendum Note (Signed)
Addended by: Osvaldo HumanEISMA, Vanette Noguchi P on: 05/06/2018 10:20 AM   Modules accepted: Orders

## 2018-05-07 ENCOUNTER — Telehealth: Payer: Self-pay | Admitting: Obstetrics & Gynecology

## 2018-05-07 ENCOUNTER — Other Ambulatory Visit: Payer: Self-pay | Admitting: Obstetrics & Gynecology

## 2018-05-07 ENCOUNTER — Encounter: Payer: Self-pay | Admitting: Obstetrics & Gynecology

## 2018-05-07 DIAGNOSIS — O99019 Anemia complicating pregnancy, unspecified trimester: Secondary | ICD-10-CM | POA: Insufficient documentation

## 2018-05-07 LAB — CBC
HEMATOCRIT: 29.7 % — AB (ref 34.0–46.6)
HEMOGLOBIN: 9.9 g/dL — AB (ref 11.1–15.9)
MCH: 30 pg (ref 26.6–33.0)
MCHC: 33.3 g/dL (ref 31.5–35.7)
MCV: 90 fL (ref 79–97)
Platelets: 272 10*3/uL (ref 150–450)
RBC: 3.3 x10E6/uL — AB (ref 3.77–5.28)
RDW: 13.4 % (ref 12.3–15.4)
WBC: 6.9 10*3/uL (ref 3.4–10.8)

## 2018-05-07 LAB — GLUCOSE TOLERANCE, 2 HOURS W/ 1HR
GLUCOSE, FASTING: 75 mg/dL (ref 65–91)
Glucose, 1 hour: 149 mg/dL (ref 65–179)
Glucose, 2 hour: 106 mg/dL (ref 65–152)

## 2018-05-07 LAB — HIV ANTIBODY (ROUTINE TESTING W REFLEX): HIV SCREEN 4TH GENERATION: NONREACTIVE

## 2018-05-07 LAB — RPR: RPR Ser Ql: NONREACTIVE

## 2018-05-07 NOTE — Progress Notes (Signed)
feraheme ordered for 2 doses

## 2018-05-07 NOTE — Telephone Encounter (Signed)
Pt called to notify her of need for feraheme infusion.  Pt told that she has an appointment for the 1st infusion on 05/14/18 at 0900 and will schedule the 2nd infusion after her visit.  Pt verbalized understanding.

## 2018-05-14 ENCOUNTER — Encounter (HOSPITAL_COMMUNITY)
Admission: RE | Admit: 2018-05-14 | Discharge: 2018-05-14 | Disposition: A | Discharge status: Home / Self Care | Payer: Medicaid Other | Source: Ambulatory Visit | Attending: Obstetrics & Gynecology | Admitting: Obstetrics & Gynecology

## 2018-05-14 DIAGNOSIS — Z3A00 Weeks of gestation of pregnancy not specified: Secondary | ICD-10-CM | POA: Diagnosis not present

## 2018-05-14 DIAGNOSIS — O99019 Anemia complicating pregnancy, unspecified trimester: Secondary | ICD-10-CM | POA: Diagnosis not present

## 2018-05-14 MED ORDER — SODIUM CHLORIDE 0.9 % IV SOLN
510.0000 mg | INTRAVENOUS | Status: DC
  Administered 2018-05-14: 510 mg via INTRAVENOUS
  Filled 2018-05-14: qty 17

## 2018-05-14 NOTE — Discharge Instructions (Signed)

## 2018-05-21 ENCOUNTER — Inpatient Hospital Stay (HOSPITAL_COMMUNITY): Admission: RE | Admit: 2018-05-21 | Payer: Medicaid Other | Source: Ambulatory Visit

## 2018-05-26 ENCOUNTER — Ambulatory Visit (HOSPITAL_COMMUNITY)
Admission: RE | Admit: 2018-05-26 | Discharge: 2018-05-26 | Disposition: A | Discharge status: Home / Self Care | Payer: Medicaid Other | Source: Ambulatory Visit | Attending: Family Medicine | Admitting: Family Medicine

## 2018-05-26 ENCOUNTER — Encounter (HOSPITAL_COMMUNITY): Payer: Self-pay

## 2018-05-26 DIAGNOSIS — Z3A31 31 weeks gestation of pregnancy: Secondary | ICD-10-CM | POA: Diagnosis not present

## 2018-05-26 DIAGNOSIS — O10013 Pre-existing essential hypertension complicating pregnancy, third trimester: Secondary | ICD-10-CM | POA: Diagnosis not present

## 2018-05-26 DIAGNOSIS — O10913 Unspecified pre-existing hypertension complicating pregnancy, third trimester: Secondary | ICD-10-CM

## 2018-05-26 DIAGNOSIS — O99013 Anemia complicating pregnancy, third trimester: Secondary | ICD-10-CM

## 2018-05-27 ENCOUNTER — Other Ambulatory Visit (HOSPITAL_COMMUNITY): Payer: Self-pay | Admitting: *Deleted

## 2018-05-27 DIAGNOSIS — O10913 Unspecified pre-existing hypertension complicating pregnancy, third trimester: Secondary | ICD-10-CM

## 2018-06-02 ENCOUNTER — Ambulatory Visit (HOSPITAL_COMMUNITY): Admission: RE | Admit: 2018-06-02 | Payer: Medicaid Other | Source: Ambulatory Visit

## 2018-06-03 ENCOUNTER — Ambulatory Visit (HOSPITAL_COMMUNITY)
Admission: RE | Admit: 2018-06-03 | Discharge: 2018-06-03 | Disposition: A | Discharge status: Home / Self Care | Payer: Medicaid Other | Source: Ambulatory Visit | Attending: Obstetrics & Gynecology | Admitting: Obstetrics & Gynecology

## 2018-06-03 DIAGNOSIS — O99013 Anemia complicating pregnancy, third trimester: Secondary | ICD-10-CM | POA: Insufficient documentation

## 2018-06-03 MED ORDER — SODIUM CHLORIDE 0.9 % IV SOLN
510.0000 mg | INTRAVENOUS | Status: DC
  Administered 2018-06-03: 510 mg via INTRAVENOUS
  Filled 2018-06-03: qty 17

## 2018-06-09 ENCOUNTER — Encounter (HOSPITAL_COMMUNITY): Payer: Self-pay

## 2018-06-09 ENCOUNTER — Ambulatory Visit (HOSPITAL_COMMUNITY)
Admission: RE | Admit: 2018-06-09 | Discharge: 2018-06-09 | Disposition: A | Discharge status: Home / Self Care | Payer: Medicaid Other | Source: Ambulatory Visit | Attending: Family Medicine | Admitting: Family Medicine

## 2018-06-09 DIAGNOSIS — O10013 Pre-existing essential hypertension complicating pregnancy, third trimester: Secondary | ICD-10-CM

## 2018-06-09 DIAGNOSIS — Z3A33 33 weeks gestation of pregnancy: Secondary | ICD-10-CM | POA: Diagnosis not present

## 2018-06-09 DIAGNOSIS — O10913 Unspecified pre-existing hypertension complicating pregnancy, third trimester: Secondary | ICD-10-CM | POA: Diagnosis not present

## 2018-06-09 DIAGNOSIS — O99013 Anemia complicating pregnancy, third trimester: Secondary | ICD-10-CM

## 2018-06-16 ENCOUNTER — Ambulatory Visit (HOSPITAL_COMMUNITY)
Admission: RE | Admit: 2018-06-16 | Discharge: 2018-06-16 | Disposition: A | Discharge status: Home / Self Care | Payer: Medicaid Other | Source: Ambulatory Visit | Attending: Family Medicine | Admitting: Family Medicine

## 2018-06-16 ENCOUNTER — Encounter (HOSPITAL_COMMUNITY): Payer: Self-pay

## 2018-06-16 DIAGNOSIS — Z3A34 34 weeks gestation of pregnancy: Secondary | ICD-10-CM

## 2018-06-16 DIAGNOSIS — O10913 Unspecified pre-existing hypertension complicating pregnancy, third trimester: Secondary | ICD-10-CM | POA: Diagnosis not present

## 2018-06-16 DIAGNOSIS — O99213 Obesity complicating pregnancy, third trimester: Secondary | ICD-10-CM

## 2018-06-16 DIAGNOSIS — O99013 Anemia complicating pregnancy, third trimester: Secondary | ICD-10-CM

## 2018-06-23 ENCOUNTER — Ambulatory Visit (HOSPITAL_COMMUNITY)
Admission: RE | Admit: 2018-06-23 | Discharge: 2018-06-23 | Disposition: A | Discharge status: Home / Self Care | Payer: Medicaid Other | Source: Ambulatory Visit | Attending: Maternal & Fetal Medicine | Admitting: Maternal & Fetal Medicine

## 2018-06-23 ENCOUNTER — Encounter (HOSPITAL_COMMUNITY): Payer: Self-pay

## 2018-06-23 ENCOUNTER — Other Ambulatory Visit (HOSPITAL_COMMUNITY): Payer: Self-pay | Admitting: *Deleted

## 2018-06-23 DIAGNOSIS — O10013 Pre-existing essential hypertension complicating pregnancy, third trimester: Secondary | ICD-10-CM

## 2018-06-23 DIAGNOSIS — O10919 Unspecified pre-existing hypertension complicating pregnancy, unspecified trimester: Secondary | ICD-10-CM

## 2018-06-23 DIAGNOSIS — Z3A35 35 weeks gestation of pregnancy: Secondary | ICD-10-CM

## 2018-06-23 DIAGNOSIS — O99213 Obesity complicating pregnancy, third trimester: Secondary | ICD-10-CM | POA: Diagnosis not present

## 2018-06-23 DIAGNOSIS — O99013 Anemia complicating pregnancy, third trimester: Secondary | ICD-10-CM

## 2018-06-23 DIAGNOSIS — O10913 Unspecified pre-existing hypertension complicating pregnancy, third trimester: Secondary | ICD-10-CM

## 2018-06-24 ENCOUNTER — Ambulatory Visit (INDEPENDENT_AMBULATORY_CARE_PROVIDER_SITE_OTHER): Payer: Medicaid Other | Admitting: Obstetrics and Gynecology

## 2018-06-24 ENCOUNTER — Encounter: Payer: Self-pay | Admitting: Obstetrics and Gynecology

## 2018-06-24 VITALS — BP 131/84 | HR 88 | Wt 218.9 lb

## 2018-06-24 DIAGNOSIS — O10919 Unspecified pre-existing hypertension complicating pregnancy, unspecified trimester: Secondary | ICD-10-CM

## 2018-06-24 DIAGNOSIS — O0990 Supervision of high risk pregnancy, unspecified, unspecified trimester: Secondary | ICD-10-CM

## 2018-06-24 NOTE — Progress Notes (Signed)
Subjective:  [name] is a 32 y.o. G4P3003 at [redacted]w[redacted]d being seen today for ongoing prenatal care.  She is currently monitored for the following issues for this high-risk pregnancy and has Supervision of high risk pregnancy, antepartum; Chronic hypertension during pregnancy, antepartum; and Anemia in pregnancy on their problem list.  Patient reports no complaints.  Contractions: Not present. Vag. Bleeding: None.  Movement: Present. Denies leaking of fluid.   The following portions of the patient's history were reviewed and updated as appropriate: allergies, current medications, past family history, past medical history, past social history, past surgical history and problem list. Problem list updated.  Objective:   Vitals:   06/24/18 1603 06/24/18 1606  BP: (!) 139/93 131/84  Pulse: 88 88  Weight: 218 lb 14.4 oz (99.3 kg)     Fetal Status: Fetal Heart Rate (bpm): 162   Movement: Present     General:  Alert, oriented and cooperative. Patient is in no acute distress.  Skin: Skin is warm and dry. No rash noted.   Cardiovascular: Normal heart rate noted  Respiratory: Normal respiratory effort, no problems with respiration noted  Abdomen: Soft, gravid, appropriate for gestational age. Pain/Pressure: Present     Pelvic:  Cervical exam deferred        Extremities: Normal range of motion.  Edema: Mild pitting, slight indentation  Mental Status: Normal mood and affect. Normal behavior. Normal judgment and thought content.   Urinalysis:      Assessment and Plan:  Pregnancy: G4P3003 at [redacted]w[redacted]d  1. Supervision of high risk pregnancy, antepartum Stable GBS/cultures next week Discussed missed appts with pt and importance of keeping  2. Chronic hypertension during pregnancy, antepartum BP stable without meds Qd BASA BPP 8/8 80 % growth 10/30 Continue with antenatal testing  Preterm labor symptoms and general obstetric precautions including but not limited to vaginal bleeding,  contractions, leaking of fluid and fetal movement were reviewed in detail with the patient. Please refer to After Visit Summary for other counseling recommendations.  Return in about 1 week (around 07/01/2018) for OB visit.   Hermina Staggers, MD

## 2018-06-24 NOTE — Patient Instructions (Signed)
Third Trimester of Pregnancy The third trimester is from week 28 through week 40 (months 7 through 9). The third trimester is a time when the unborn baby (fetus) is growing rapidly. At the end of the ninth month, the fetus is about 20 inches in length and weighs 6-10 pounds. Body changes during your third trimester Your body will continue to go through many changes during pregnancy. The changes vary from woman to woman. During the third trimester:  Your weight will continue to increase. You can expect to gain 25-35 pounds (11-16 kg) by the end of the pregnancy.  You may begin to get stretch marks on your hips, abdomen, and breasts.  You may urinate more often because the fetus is moving lower into your pelvis and pressing on your bladder.  You may develop or continue to have heartburn. This is caused by increased hormones that slow down muscles in the digestive tract.  You may develop or continue to have constipation because increased hormones slow digestion and cause the muscles that push waste through your intestines to relax.  You may develop hemorrhoids. These are swollen veins (varicose veins) in the rectum that can itch or be painful.  You may develop swollen, bulging veins (varicose veins) in your legs.  You may have increased body aches in the pelvis, back, or thighs. This is due to weight gain and increased hormones that are relaxing your joints.  You may have changes in your hair. These can include thickening of your hair, rapid growth, and changes in texture. Some women also have hair loss during or after pregnancy, or hair that feels dry or thin. Your hair will most likely return to normal after your baby is born.  Your breasts will continue to grow and they will continue to become tender. A yellow fluid (colostrum) may leak from your breasts. This is the first milk you are producing for your baby.  Your belly button may stick out.  You may notice more swelling in your hands,  face, or ankles.  You may have increased tingling or numbness in your hands, arms, and legs. The skin on your belly may also feel numb.  You may feel short of breath because of your expanding uterus.  You may have more problems sleeping. This can be caused by the size of your belly, increased need to urinate, and an increase in your body's metabolism.  You may notice the fetus "dropping," or moving lower in your abdomen (lightening).  You may have increased vaginal discharge.  You may notice your joints feel loose and you may have pain around your pelvic bone.  What to expect at prenatal visits You will have prenatal exams every 2 weeks until week 36. Then you will have weekly prenatal exams. During a routine prenatal visit:  You will be weighed to make sure you and the baby are growing normally.  Your blood pressure will be taken.  Your abdomen will be measured to track your baby's growth.  The fetal heartbeat will be listened to.  Any test results from the previous visit will be discussed.  You may have a cervical check near your due date to see if your cervix has softened or thinned (effaced).  You will be tested for Group B streptococcus. This happens between 35 and 37 weeks.  Your health care provider may ask you:  What your birth plan is.  How you are feeling.  If you are feeling the baby move.  If you have had   any abnormal symptoms, such as leaking fluid, bleeding, severe headaches, or abdominal cramping.  If you are using any tobacco products, including cigarettes, chewing tobacco, and electronic cigarettes.  If you have any questions.  Other tests or screenings that may be performed during your third trimester include:  Blood tests that check for low iron levels (anemia).  Fetal testing to check the health, activity level, and growth of the fetus. Testing is done if you have certain medical conditions or if there are problems during the  pregnancy.  Nonstress test (NST). This test checks the health of your baby to make sure there are no signs of problems, such as the baby not getting enough oxygen. During this test, a belt is placed around your belly. The baby is made to move, and its heart rate is monitored during movement.  What is false labor? False labor is a condition in which you feel small, irregular tightenings of the muscles in the womb (contractions) that usually go away with rest, changing position, or drinking water. These are called Braxton Hicks contractions. Contractions may last for hours, days, or even weeks before true labor sets in. If contractions come at regular intervals, become more frequent, increase in intensity, or become painful, you should see your health care provider. What are the signs of labor?  Abdominal cramps.  Regular contractions that start at 10 minutes apart and become stronger and more frequent with time.  Contractions that start on the top of the uterus and spread down to the lower abdomen and back.  Increased pelvic pressure and dull back pain.  A watery or bloody mucus discharge that comes from the vagina.  Leaking of amniotic fluid. This is also known as your "water breaking." It could be a slow trickle or a gush. Let your health care provider know if it has a color or strange odor. If you have any of these signs, call your health care provider right away, even if it is before your due date. Follow these instructions at home: Medicines  Follow your health care provider's instructions regarding medicine use. Specific medicines may be either safe or unsafe to take during pregnancy.  Take a prenatal vitamin that contains at least 600 micrograms (mcg) of folic acid.  If you develop constipation, try taking a stool softener if your health care provider approves. Eating and drinking  Eat a balanced diet that includes fresh fruits and vegetables, whole grains, good sources of protein  such as meat, eggs, or tofu, and low-fat dairy. Your health care provider will help you determine the amount of weight gain that is right for you.  Avoid raw meat and uncooked cheese. These carry germs that can cause birth defects in the baby.  If you have low calcium intake from food, talk to your health care provider about whether you should take a daily calcium supplement.  Eat four or five small meals rather than three large meals a day.  Limit foods that are high in fat and processed sugars, such as fried and sweet foods.  To prevent constipation: ? Drink enough fluid to keep your urine clear or pale yellow. ? Eat foods that are high in fiber, such as fresh fruits and vegetables, whole grains, and beans. Activity  Exercise only as directed by your health care provider. Most women can continue their usual exercise routine during pregnancy. Try to exercise for 30 minutes at least 5 days a week. Stop exercising if you experience uterine contractions.  Avoid heavy   lifting.  Do not exercise in extreme heat or humidity, or at high altitudes.  Wear low-heel, comfortable shoes.  Practice good posture.  You may continue to have sex unless your health care provider tells you otherwise. Relieving pain and discomfort  Take frequent breaks and rest with your legs elevated if you have leg cramps or low back pain.  Take warm sitz baths to soothe any pain or discomfort caused by hemorrhoids. Use hemorrhoid cream if your health care provider approves.  Wear a good support bra to prevent discomfort from breast tenderness.  If you develop varicose veins: ? Wear support pantyhose or compression stockings as told by your healthcare provider. ? Elevate your feet for 15 minutes, 3-4 times a day. Prenatal care  Write down your questions. Take them to your prenatal visits.  Keep all your prenatal visits as told by your health care provider. This is important. Safety  Wear your seat belt at  all times when driving.  Make a list of emergency phone numbers, including numbers for family, friends, the hospital, and police and fire departments. General instructions  Avoid cat litter boxes and soil used by cats. These carry germs that can cause birth defects in the baby. If you have a cat, ask someone to clean the litter box for you.  Do not travel far distances unless it is absolutely necessary and only with the approval of your health care provider.  Do not use hot tubs, steam rooms, or saunas.  Do not drink alcohol.  Do not use any products that contain nicotine or tobacco, such as cigarettes and e-cigarettes. If you need help quitting, ask your health care provider.  Do not use any medicinal herbs or unprescribed drugs. These chemicals affect the formation and growth of the baby.  Do not douche or use tampons or scented sanitary pads.  Do not cross your legs for long periods of time.  To prepare for the arrival of your baby: ? Take prenatal classes to understand, practice, and ask questions about labor and delivery. ? Make a trial run to the hospital. ? Visit the hospital and tour the maternity area. ? Arrange for maternity or paternity leave through employers. ? Arrange for family and friends to take care of pets while you are in the hospital. ? Purchase a rear-facing car seat and make sure you know how to install it in your car. ? Pack your hospital bag. ? Prepare the baby's nursery. Make sure to remove all pillows and stuffed animals from the baby's crib to prevent suffocation.  Visit your dentist if you have not gone during your pregnancy. Use a soft toothbrush to brush your teeth and be gentle when you floss. Contact a health care provider if:  You are unsure if you are in labor or if your water has broken.  You become dizzy.  You have mild pelvic cramps, pelvic pressure, or nagging pain in your abdominal area.  You have lower back pain.  You have persistent  nausea, vomiting, or diarrhea.  You have an unusual or bad smelling vaginal discharge.  You have pain when you urinate. Get help right away if:  Your water breaks before 37 weeks.  You have regular contractions less than 5 minutes apart before 37 weeks.  You have a fever.  You are leaking fluid from your vagina.  You have spotting or bleeding from your vagina.  You have severe abdominal pain or cramping.  You have rapid weight loss or weight gain.    You have shortness of breath with chest pain.  You notice sudden or extreme swelling of your face, hands, ankles, feet, or legs.  Your baby makes fewer than 10 movements in 2 hours.  You have severe headaches that do not go away when you take medicine.  You have vision changes. Summary  The third trimester is from week 28 through week 40, months 7 through 9. The third trimester is a time when the unborn baby (fetus) is growing rapidly.  During the third trimester, your discomfort may increase as you and your baby continue to gain weight. You may have abdominal, leg, and back pain, sleeping problems, and an increased need to urinate.  During the third trimester your breasts will keep growing and they will continue to become tender. A yellow fluid (colostrum) may leak from your breasts. This is the first milk you are producing for your baby.  False labor is a condition in which you feel small, irregular tightenings of the muscles in the womb (contractions) that eventually go away. These are called Braxton Hicks contractions. Contractions may last for hours, days, or even weeks before true labor sets in.  Signs of labor can include: abdominal cramps; regular contractions that start at 10 minutes apart and become stronger and more frequent with time; watery or bloody mucus discharge that comes from the vagina; increased pelvic pressure and dull back pain; and leaking of amniotic fluid. This information is not intended to replace advice  given to you by your health care provider. Make sure you discuss any questions you have with your health care provider. Document Released: 08/05/2001 Document Revised: 01/17/2016 Document Reviewed: 10/12/2012 Elsevier Interactive Patient Education  2017 [employer]  

## 2018-06-30 ENCOUNTER — Encounter: Payer: Medicaid Other | Admitting: Family Medicine

## 2018-06-30 ENCOUNTER — Encounter: Payer: Self-pay | Admitting: Family Medicine

## 2018-06-30 ENCOUNTER — Ambulatory Visit (HOSPITAL_COMMUNITY)
Admission: RE | Admit: 2018-06-30 | Discharge: 2018-06-30 | Disposition: A | Discharge status: Home / Self Care | Payer: Medicaid Other | Source: Ambulatory Visit | Attending: Family Medicine | Admitting: Family Medicine

## 2018-06-30 ENCOUNTER — Encounter (HOSPITAL_COMMUNITY): Payer: Self-pay

## 2018-06-30 NOTE — Progress Notes (Signed)
Patient did not keep appointment today. She will be called to reschedule.  

## 2018-07-07 ENCOUNTER — Encounter (HOSPITAL_COMMUNITY): Payer: Self-pay

## 2018-07-07 ENCOUNTER — Encounter: Payer: Self-pay | Admitting: Obstetrics and Gynecology

## 2018-07-07 ENCOUNTER — Other Ambulatory Visit (HOSPITAL_COMMUNITY)
Admission: RE | Admit: 2018-07-07 | Discharge: 2018-07-07 | Disposition: A | Discharge status: Home / Self Care | Payer: Medicaid Other | Source: Ambulatory Visit | Attending: Obstetrics & Gynecology | Admitting: Obstetrics & Gynecology

## 2018-07-07 ENCOUNTER — Ambulatory Visit (HOSPITAL_COMMUNITY)
Admission: RE | Admit: 2018-07-07 | Discharge: 2018-07-07 | Disposition: A | Discharge status: Home / Self Care | Payer: Medicaid Other | Source: Ambulatory Visit | Attending: Family Medicine | Admitting: Family Medicine

## 2018-07-07 ENCOUNTER — Ambulatory Visit (INDEPENDENT_AMBULATORY_CARE_PROVIDER_SITE_OTHER): Payer: Medicaid Other | Admitting: Obstetrics and Gynecology

## 2018-07-07 ENCOUNTER — Other Ambulatory Visit (HOSPITAL_COMMUNITY): Payer: Self-pay | Admitting: *Deleted

## 2018-07-07 VITALS — BP 144/77 | HR 106 | Wt 219.7 lb

## 2018-07-07 DIAGNOSIS — O0993 Supervision of high risk pregnancy, unspecified, third trimester: Secondary | ICD-10-CM

## 2018-07-07 DIAGNOSIS — O10913 Unspecified pre-existing hypertension complicating pregnancy, third trimester: Secondary | ICD-10-CM | POA: Diagnosis not present

## 2018-07-07 DIAGNOSIS — O10013 Pre-existing essential hypertension complicating pregnancy, third trimester: Secondary | ICD-10-CM | POA: Diagnosis not present

## 2018-07-07 DIAGNOSIS — O10919 Unspecified pre-existing hypertension complicating pregnancy, unspecified trimester: Secondary | ICD-10-CM

## 2018-07-07 DIAGNOSIS — O9989 Other specified diseases and conditions complicating pregnancy, childbirth and the puerperium: Secondary | ICD-10-CM | POA: Diagnosis not present

## 2018-07-07 DIAGNOSIS — Z3A37 37 weeks gestation of pregnancy: Secondary | ICD-10-CM | POA: Diagnosis not present

## 2018-07-07 DIAGNOSIS — O0990 Supervision of high risk pregnancy, unspecified, unspecified trimester: Secondary | ICD-10-CM | POA: Insufficient documentation

## 2018-07-07 DIAGNOSIS — O99013 Anemia complicating pregnancy, third trimester: Secondary | ICD-10-CM

## 2018-07-07 DIAGNOSIS — O99213 Obesity complicating pregnancy, third trimester: Secondary | ICD-10-CM | POA: Diagnosis not present

## 2018-07-07 LAB — OB RESULTS CONSOLE GC/CHLAMYDIA: Gonorrhea: NEGATIVE

## 2018-07-07 LAB — OB RESULTS CONSOLE GBS: GBS: NEGATIVE

## 2018-07-07 LAB — POCT URINALYSIS DIP (DEVICE)
Bilirubin Urine: NEGATIVE
GLUCOSE, UA: NEGATIVE mg/dL
Hgb urine dipstick: NEGATIVE
KETONES UR: NEGATIVE mg/dL
Nitrite: NEGATIVE
PH: 7 (ref 5.0–8.0)
Protein, ur: NEGATIVE mg/dL
Specific Gravity, Urine: 1.015 (ref 1.005–1.030)
Urobilinogen, UA: 0.2 mg/dL (ref 0.0–1.0)

## 2018-07-07 NOTE — Progress Notes (Signed)
Pt states for the last 2 days has been having pain in her lower left abdomen. Did not take any meds for it, still having pain. Explained that she can take Tylenol.

## 2018-07-07 NOTE — Progress Notes (Signed)
   PRENATAL VISIT NOTE  Subjective:  [name] is a 32 y.o. G4P3003 at 4872w3d being seen today for ongoing prenatal care.  She is currently monitored for the following issues for this high-risk pregnancy and has Supervision of high risk pregnancy, antepartum; Chronic hypertension during pregnancy, antepartum; and Anemia in pregnancy on their problem list.  Patient reports pelvic pressure.  Contractions: Irritability. Vag. Bleeding: None.  Movement: Present. Denies leaking of fluid.   The following portions of the patient's history were reviewed and updated as appropriate: allergies, current medications, past family history, past medical history, past social history, past surgical history and problem list. Problem list updated.  Objective:   Vitals:   07/07/18 1611  BP: (!) 144/77  Pulse: (!) 106  Weight: 219 lb 11.2 oz (99.7 kg)    Fetal Status: Fetal Heart Rate (bpm): 143 Fundal Height: 38 cm Movement: Present  Presentation: Vertex  General:  Alert, oriented and cooperative. Patient is in no acute distress.  Skin: Skin is warm and dry. No rash noted.   Cardiovascular: Normal heart rate noted  Respiratory: Normal respiratory effort, no problems with respiration noted  Abdomen: Soft, gravid, appropriate for gestational age.  Pain/Pressure: Present     Pelvic: Cervical exam performed Dilation: 1.5 Effacement (%): 50 Station: -3  Extremities: Normal range of motion.  Edema: Trace  Mental Status: Normal mood and affect. Normal behavior. Normal judgment and thought content.   Assessment and Plan:  Pregnancy: G4P3003 at 1872w3d  1. Supervision of high risk pregnancy, antepartum Patient is doing well without complaints Cultures today - GC/Chlamydia probe amp ([place])not at [facility] - Culture, beta strep (group b only)  2. Chronic hypertension during pregnancy, antepartum No meds Continue close monitoring Patient had ultrasound today NST today - Fetal nonstress test  Term  labor symptoms and general obstetric precautions including but not limited to vaginal bleeding, contractions, leaking of fluid and fetal movement were reviewed in detail with the patient. Please refer to After Visit Summary for other counseling recommendations.  Return in about 1 week (around 07/14/2018) for NST/BPP and HOB.  Future Appointments  Date Time Provider Department Center  07/14/2018 12:30 PM [facility] US 1 [facility]  07/14/2018  2:35 PM Allie Bossierove, Myra C, MD [facility]  07/21/2018  2:35 PM Arvilla MarketWallace, Catherine Lauren, DO [facility]    Catalina AntiguaPeggy Holly Iannaccone, MD

## 2018-07-09 LAB — GC/CHLAMYDIA PROBE AMP (~~LOC~~) NOT AT ARMC
CHLAMYDIA, DNA PROBE: NEGATIVE
NEISSERIA GONORRHEA: NEGATIVE

## 2018-07-10 LAB — CULTURE, BETA STREP (GROUP B ONLY): Strep Gp B Culture: NEGATIVE

## 2018-07-14 ENCOUNTER — Other Ambulatory Visit (HOSPITAL_COMMUNITY): Payer: Self-pay | Admitting: *Deleted

## 2018-07-14 ENCOUNTER — Ambulatory Visit (HOSPITAL_COMMUNITY)
Admission: RE | Admit: 2018-07-14 | Discharge: 2018-07-14 | Disposition: A | Discharge status: Home / Self Care | Payer: Medicaid Other | Source: Ambulatory Visit | Attending: Obstetrics & Gynecology | Admitting: Obstetrics & Gynecology

## 2018-07-14 ENCOUNTER — Encounter: Payer: Medicaid Other | Admitting: Obstetrics & Gynecology

## 2018-07-14 ENCOUNTER — Encounter (HOSPITAL_COMMUNITY): Payer: Self-pay

## 2018-07-14 DIAGNOSIS — O10913 Unspecified pre-existing hypertension complicating pregnancy, third trimester: Secondary | ICD-10-CM

## 2018-07-14 DIAGNOSIS — Z3A38 38 weeks gestation of pregnancy: Secondary | ICD-10-CM | POA: Diagnosis not present

## 2018-07-14 DIAGNOSIS — O99213 Obesity complicating pregnancy, third trimester: Secondary | ICD-10-CM | POA: Diagnosis not present

## 2018-07-14 DIAGNOSIS — O10013 Pre-existing essential hypertension complicating pregnancy, third trimester: Secondary | ICD-10-CM | POA: Diagnosis not present

## 2018-07-14 DIAGNOSIS — O10919 Unspecified pre-existing hypertension complicating pregnancy, unspecified trimester: Secondary | ICD-10-CM

## 2018-07-15 ENCOUNTER — Other Ambulatory Visit: Payer: Medicaid Other

## 2018-07-15 ENCOUNTER — Encounter: Payer: Medicaid Other | Admitting: Family Medicine

## 2018-07-15 ENCOUNTER — Encounter: Payer: Self-pay | Admitting: Family Medicine

## 2018-07-15 NOTE — Progress Notes (Signed)
Patient did not keep appointment today. She will be called to reschedule.  

## 2018-07-19 ENCOUNTER — Inpatient Hospital Stay (HOSPITAL_COMMUNITY): Payer: Medicaid Other | Admitting: Anesthesiology

## 2018-07-19 ENCOUNTER — Inpatient Hospital Stay (HOSPITAL_COMMUNITY)
Admission: AD | Admit: 2018-07-19 | Discharge: 2018-07-22 | DRG: 797 | Disposition: A | Discharge status: Home / Self Care | Payer: Medicaid Other | Attending: Family Medicine | Admitting: Family Medicine

## 2018-07-19 ENCOUNTER — Ambulatory Visit (HOSPITAL_BASED_OUTPATIENT_CLINIC_OR_DEPARTMENT_OTHER)
Admission: RE | Admit: 2018-07-19 | Discharge: 2018-07-19 | Disposition: A | Discharge status: Home / Self Care | Payer: Medicaid Other | Source: Ambulatory Visit | Attending: Obstetrics & Gynecology | Admitting: Obstetrics & Gynecology

## 2018-07-19 ENCOUNTER — Encounter (HOSPITAL_COMMUNITY): Payer: Self-pay

## 2018-07-19 ENCOUNTER — Other Ambulatory Visit: Payer: Self-pay

## 2018-07-19 ENCOUNTER — Encounter (HOSPITAL_COMMUNITY): Payer: Self-pay | Admitting: *Deleted

## 2018-07-19 DIAGNOSIS — Z3A39 39 weeks gestation of pregnancy: Secondary | ICD-10-CM

## 2018-07-19 DIAGNOSIS — O3663X0 Maternal care for excessive fetal growth, third trimester, not applicable or unspecified: Secondary | ICD-10-CM | POA: Diagnosis present

## 2018-07-19 DIAGNOSIS — O1002 Pre-existing essential hypertension complicating childbirth: Secondary | ICD-10-CM | POA: Diagnosis present

## 2018-07-19 DIAGNOSIS — O9902 Anemia complicating childbirth: Secondary | ICD-10-CM | POA: Diagnosis present

## 2018-07-19 DIAGNOSIS — O10013 Pre-existing essential hypertension complicating pregnancy, third trimester: Secondary | ICD-10-CM | POA: Insufficient documentation

## 2018-07-19 DIAGNOSIS — O0990 Supervision of high risk pregnancy, unspecified, unspecified trimester: Secondary | ICD-10-CM

## 2018-07-19 DIAGNOSIS — Z87891 Personal history of nicotine dependence: Secondary | ICD-10-CM | POA: Diagnosis not present

## 2018-07-19 DIAGNOSIS — O4103X0 Oligohydramnios, third trimester, not applicable or unspecified: Principal | ICD-10-CM | POA: Diagnosis present

## 2018-07-19 DIAGNOSIS — O99213 Obesity complicating pregnancy, third trimester: Secondary | ICD-10-CM | POA: Diagnosis not present

## 2018-07-19 DIAGNOSIS — O99214 Obesity complicating childbirth: Secondary | ICD-10-CM | POA: Diagnosis present

## 2018-07-19 DIAGNOSIS — O4100X0 Oligohydramnios, unspecified trimester, not applicable or unspecified: Secondary | ICD-10-CM | POA: Diagnosis present

## 2018-07-19 DIAGNOSIS — O10919 Unspecified pre-existing hypertension complicating pregnancy, unspecified trimester: Secondary | ICD-10-CM

## 2018-07-19 DIAGNOSIS — O99013 Anemia complicating pregnancy, third trimester: Secondary | ICD-10-CM

## 2018-07-19 DIAGNOSIS — Z302 Encounter for sterilization: Secondary | ICD-10-CM | POA: Diagnosis not present

## 2018-07-19 DIAGNOSIS — O10913 Unspecified pre-existing hypertension complicating pregnancy, third trimester: Secondary | ICD-10-CM

## 2018-07-19 DIAGNOSIS — D649 Anemia, unspecified: Secondary | ICD-10-CM | POA: Diagnosis present

## 2018-07-19 DIAGNOSIS — O99019 Anemia complicating pregnancy, unspecified trimester: Secondary | ICD-10-CM | POA: Diagnosis present

## 2018-07-19 HISTORY — DX: Gestational (pregnancy-induced) hypertension without significant proteinuria, unspecified trimester: O13.9

## 2018-07-19 LAB — COMPREHENSIVE METABOLIC PANEL
ALBUMIN: 3 g/dL — AB (ref 3.5–5.0)
ALK PHOS: 131 U/L — AB (ref 38–126)
ALT: 13 U/L (ref 0–44)
AST: 18 U/L (ref 15–41)
Anion gap: 9 (ref 5–15)
CALCIUM: 8.7 mg/dL — AB (ref 8.9–10.3)
CO2: 21 mmol/L — AB (ref 22–32)
CREATININE: 0.38 mg/dL — AB (ref 0.44–1.00)
Chloride: 107 mmol/L (ref 98–111)
GFR calc non Af Amer: >60 mL/min (ref >60)
GLUCOSE: 67 mg/dL — AB (ref 70–99)
Potassium: 3.7 mmol/L (ref 3.5–5.1)
Sodium: 137 mmol/L (ref 135–145)
Total Bilirubin: 1 mg/dL (ref 0.3–1.2)
Total Protein: 6.3 g/dL — ABNORMAL LOW (ref 6.5–8.1)

## 2018-07-19 LAB — TYPE AND SCREEN
ABO/RH(D): O POS
Antibody Screen: NEGATIVE

## 2018-07-19 LAB — CBC
HCT: 34.2 % — ABNORMAL LOW (ref 36.0–46.0)
HEMOGLOBIN: 11.4 g/dL — AB (ref 12.0–15.0)
MCH: 30.2 pg (ref 26.0–34.0)
MCHC: 33.3 g/dL (ref 30.0–36.0)
MCV: 90.7 fL (ref 80.0–100.0)
NRBC: 0 % (ref 0.0–0.2)
PLATELETS: 261 10*3/uL (ref 150–400)
RBC: 3.77 MIL/uL — AB (ref 3.87–5.11)
RDW: 14.5 % (ref 11.5–15.5)
WBC: 6.5 10*3/uL (ref 4.0–10.5)

## 2018-07-19 LAB — PROTEIN / CREATININE RATIO, URINE
Creatinine, Urine: 30 mg/dL
Total Protein, Urine: <6 mg/dL

## 2018-07-19 MED ORDER — TERBUTALINE SULFATE 1 MG/ML IJ SOLN: 0.2500 mg | Freq: Once | INTRAMUSCULAR | Status: DC | PRN

## 2018-07-19 MED ORDER — OXYTOCIN 40 UNITS IN LACTATED RINGERS INFUSION - SIMPLE MED
1.0000 m[IU]/min | INTRAVENOUS | Status: DC
  Administered 2018-07-19: 2 m[IU]/min via INTRAVENOUS
  Administered 2018-07-20: 28 m[IU]/min via INTRAVENOUS
  Administered 2018-07-20: 300 mL via INTRAVENOUS
  Filled 2018-07-19: qty 1000

## 2018-07-19 MED ORDER — LIDOCAINE HCL (PF) 1 % IJ SOLN
30.0000 mL | INTRAMUSCULAR | Status: DC | PRN
  Filled 2018-07-19: qty 30

## 2018-07-19 MED ORDER — PHENYLEPHRINE 40 MCG/ML (10ML) SYRINGE FOR IV PUSH (FOR BLOOD PRESSURE SUPPORT): 80.0000 ug | PREFILLED_SYRINGE | INTRAVENOUS | Status: DC | PRN

## 2018-07-19 MED ORDER — LACTATED RINGERS IV SOLN
INTRAVENOUS | Status: DC
  Administered 2018-07-19 – 2018-07-20 (×2): via INTRAVENOUS

## 2018-07-19 MED ORDER — DIPHENHYDRAMINE HCL 50 MG/ML IJ SOLN: 12.5000 mg | INTRAMUSCULAR | Status: DC | PRN

## 2018-07-19 MED ORDER — EPHEDRINE 5 MG/ML INJ: 10.0000 mg | INTRAVENOUS | Status: DC | PRN

## 2018-07-19 MED ORDER — ACETAMINOPHEN 325 MG PO TABS
650.0000 mg | ORAL_TABLET | ORAL | Status: DC | PRN
  Administered 2018-07-19 – 2018-07-20 (×3): 650 mg via ORAL
  Filled 2018-07-19 (×3): qty 2

## 2018-07-19 MED ORDER — OXYCODONE-ACETAMINOPHEN 5-325 MG PO TABS: 2.0000 | ORAL_TABLET | ORAL | Status: DC | PRN

## 2018-07-19 MED ORDER — PHENYLEPHRINE 40 MCG/ML (10ML) SYRINGE FOR IV PUSH (FOR BLOOD PRESSURE SUPPORT)
80.0000 ug | PREFILLED_SYRINGE | INTRAVENOUS | Status: DC | PRN
  Filled 2018-07-19: qty 10

## 2018-07-19 MED ORDER — SOD CITRATE-CITRIC ACID 500-334 MG/5ML PO SOLN
30.0000 mL | ORAL | Status: DC | PRN
  Administered 2018-07-20: 30 mL via ORAL
  Filled 2018-07-19: qty 15

## 2018-07-19 MED ORDER — OXYTOCIN 40 UNITS IN LACTATED RINGERS INFUSION - SIMPLE MED
2.5000 [IU]/h | INTRAVENOUS | Status: DC
  Filled 2018-07-19: qty 1000

## 2018-07-19 MED ORDER — OXYCODONE-ACETAMINOPHEN 5-325 MG PO TABS: 1.0000 | ORAL_TABLET | ORAL | Status: DC | PRN

## 2018-07-19 MED ORDER — LACTATED RINGERS IV SOLN
500.0000 mL | INTRAVENOUS | Status: DC | PRN
  Administered 2018-07-20: 1000 mL via INTRAVENOUS

## 2018-07-19 MED ORDER — LABETALOL HCL 200 MG PO TABS
200.0000 mg | ORAL_TABLET | Freq: Two times a day (BID) | ORAL | Status: DC
  Administered 2018-07-19 (×2): 200 mg via ORAL
  Filled 2018-07-19 (×2): qty 1

## 2018-07-19 MED ORDER — LACTATED RINGERS IV SOLN: 500.0000 mL | Freq: Once | INTRAVENOUS | Status: DC

## 2018-07-19 MED ORDER — ONDANSETRON HCL 4 MG/2ML IJ SOLN: 4.0000 mg | Freq: Four times a day (QID) | INTRAMUSCULAR | Status: DC | PRN

## 2018-07-19 MED ORDER — OXYTOCIN BOLUS FROM INFUSION
500.0000 mL | Freq: Once | INTRAVENOUS | Status: AC
  Administered 2018-07-20: 500 mL via INTRAVENOUS

## 2018-07-19 MED ORDER — MISOPROSTOL 50MCG HALF TABLET
50.0000 ug | ORAL_TABLET | Freq: Once | ORAL | Status: AC
  Administered 2018-07-19: 50 ug via BUCCAL
  Filled 2018-07-19: qty 1

## 2018-07-19 MED ORDER — FENTANYL 2.5 MCG/ML BUPIVACAINE 1/10 % EPIDURAL INFUSION (WH - ANES)
14.0000 mL/h | INTRAMUSCULAR | Status: DC | PRN
  Administered 2018-07-19 – 2018-07-20 (×3): 14 mL/h via EPIDURAL
  Filled 2018-07-19 (×4): qty 100

## 2018-07-19 NOTE — Progress Notes (Signed)
POC discussed with pt. Pt verbalizes understanding and will notify RN of discomfort, pain, ROM, and bleeding.   

## 2018-07-19 NOTE — Anesthesia Procedure Notes (Signed)
Epidural Patient location during procedure: OB Start time: 07/19/2018 9:38 PM End time: 07/19/2018 9:40 PM  Staffing Anesthesiologist: Bethena Midgetddono, Eastin Swing, MD  Preanesthetic Checklist Completed: patient identified, site marked, surgical consent, pre-op evaluation, timeout performed, IV checked, risks and benefits discussed and monitors and equipment checked  Epidural Patient position: sitting Prep: site prepped and draped and DuraPrep Patient monitoring: continuous pulse ox and blood pressure Approach: midline Location: L3-L4 Injection technique: LOR air  Needle:  Needle type: Tuohy  Needle gauge: 17 G Needle length: 9 cm and 9 Needle insertion depth: 8 cm Catheter type: closed end flexible Catheter size: 19 Gauge Catheter at skin depth: 13 cm Test dose: negative  Assessment Events: blood not aspirated, injection not painful, no injection resistance, negative IV test and no paresthesia

## 2018-07-19 NOTE — H&P (Addendum)
LABOR AND DELIVERY ADMISSION HISTORY AND PHYSICAL NOTE  [name] is a 32 y.o. female [phone] with IUP at [redacted]w[redacted]d by 1st trimester [place] presenting for IOL for oligohydramnios. She also has cHTN and has not required medication this preg. U/S today shows EFW 9+14.  She reports positive fetal movement. She denies leakage of fluid or vaginal bleeding.  Prenatal History/Complications: PNC at [facility] Pregnancy complications:  - cHTN  Past Medical History: Past Medical History:  Diagnosis Date  . Anemia   . Chronic hypertension during pregnancy, antepartum 12/24/2017   [x]  Aspirin 81 mg daily after 12 weeks; discontinue after 36 weeks Current antihypertensives:  None   Baseline and surveillance labs (pulled in from [facility], refresh links as needed)  Lab Results Component Value Date  PLT 353 11/25/2016  CREATININE 0.50 11/24/2017  AST 24 11/25/2016  ALT 17 11/25/2016  PROTCRRATIO 0.30 (H) 12/31/2015   Antenatal Testing CHTN - O10.919  Group I  BP < 140/90, no preecl  . Hypertension   . Pregnancy induced hypertension     Past Surgical History: Past Surgical History:  Procedure Laterality Date  . NO PAST SURGERIES      Obstetrical History: OB History    Gravida  4   Para  3   Term  3   Preterm      AB      Living  3     SAB      TAB      Ectopic      Multiple  0   Live Births  3           Social History: Social History   Socioeconomic History  . Marital status: Single    Spouse name: Not on file  . Number of children: Not on file  . Years of education: Not on file  . Highest education level: Not on file  Occupational History  . Not on file  Social Needs  . Financial resource strain: Not on file  . Food insecurity:    Worry: Not on file    Inability: Not on file  . Transportation needs:    Medical: Not on file    Non-medical: Not on file  Tobacco Use  . Smoking status: Former Smoker    Types: Cigarettes    Last attempt to quit: 03/29/2013    Years since  quitting: 5.3  . Smokeless tobacco: Never Used  Substance and Sexual Activity  . Alcohol use: Not Currently  . Drug use: No  . Sexual activity: Yes    Birth control/protection: None  Lifestyle  . Physical activity:    Days per week: Not on file    Minutes per session: Not on file  . Stress: Not on file  Relationships  . Social connections:    Talks on phone: Not on file    Gets together: Not on file    Attends religious service: Not on file    Active member of club or organization: Not on file    Attends meetings of clubs or organizations: Not on file    Relationship status: Not on file  Other Topics Concern  . Not on file  Social History Narrative  . Not on file    Family History: Family History  Problem Relation Age of Onset  . Hypertension Mother     Allergies: No Known Allergies  Medications Prior to Admission  Medication Sig Dispense Refill Last Dose  . aspirin 81 MG tablet Take 1  tablet (81 mg total) by mouth daily. 30 tablet 6 Taking  . ferrous gluconate (FERGON) 324 MG tablet Take 1 tablet (324 mg total) by mouth 2 (two) times daily with a meal. 60 tablet 3 Taking  . Prenatal Vit-DSS-Fe Fum-FA (PRENATAL 19) tablet Take 1 tablet by mouth daily. 30 tablet 11 Taking  . promethazine (PHENERGAN) 25 MG tablet Take 1 tablet (25 mg total) by mouth every 6 (six) hours as needed for nausea or vomiting. May take 1/2 tab if needed 30 tablet 0 Taking     Review of Systems  All systems reviewed and negative except as stated in HPI  Physical Exam Blood pressure (!) 150/90, pulse 97, temperature 99.4 F (37.4 C), temperature source Oral, resp. rate 18, height 5\' 2"  (1.575 m), weight 101.4 kg, last menstrual period 10/13/2017, unknown if currently breastfeeding. General appearance: alert, oriented, NAD Lungs: normal respiratory effort Heart: regular rate Abdomen: soft, non-tender; gravid, FH appropriate for GA Extremities: No calf swelling or tenderness Presentation:  cephalic Fetal monitoring: BLHR ~140. Acceleration, negative decels Uterine activity: no regular contractions    Prenatal labs: ABO, Rh: O/Positive/-- (05/02 1552) Antibody: Negative (05/02 1552) Rubella: 8.17 (05/02 1552) RPR: Non Reactive (09/12 0904)  HBsAg: Negative (05/02 1552)  HIV: Non Reactive (09/12 0904)  GC/Chlamydia: negative GBS:   negative 2-hr GTT: normal Genetic screening:  Quad: negative Anatomy US: normal  Prenatal Transfer Tool  Maternal Diabetes: No Genetic Screening: Normal Maternal Ultrasounds/Referrals: Normal Fetal Ultrasounds or other Referrals:  None Maternal Substance Abuse:  No Significant Maternal Medications:  None Significant Maternal Lab Results: Lab values include: Group B Strep negative  Results for orders placed or performed during the hospital encounter of 07/19/18 (from the past 24 hour(s))  CBC   Collection Time: 07/19/18 12:26 PM  Result Value Ref Range   WBC 6.5 4.0 - 10.5 K/uL   RBC 3.77 (L) 3.87 - 5.11 MIL/uL   Hemoglobin 11.4 (L) 12.0 - 15.0 g/dL   HCT 29.534.2 (L) 62.136.0 - 30.846.0 %   MCV 90.7 80.0 - 100.0 fL   MCH 30.2 26.0 - 34.0 pg   MCHC 33.3 30.0 - 36.0 g/dL   RDW 65.714.5 84.611.5 - 96.215.5 %   Platelets 261 150 - 400 K/uL   nRBC 0.0 0.0 - 0.2 %    Patient Active Problem List   Diagnosis Date Noted  . Indication for care in labor or delivery 07/19/2018  . Anemia in pregnancy 05/07/2018  . Supervision of high risk pregnancy, antepartum 12/24/2017  . Chronic hypertension during pregnancy, antepartum 12/24/2017    Assessment: [name] is a 32 y.o. G4P3003 at 4463w1d here for IOL oligohydramnios; and with chronic hypertension and LGA shown on US at clinic apt today. Cervix 2cm, 50%, -2 station on initial evaluation.  #Labor: induction- cytotec #Pain: Epidural- will plan placement for delivery and maintain for PP BTL #FWB: Category I #ID:  GBS negative #MOF: breast #MOC:PP BTL- papers signed #Circ:  Girl, NA  Chelsey Anderson  DO 07/19/2018, 12:56 PM   CNM attestation:  I have seen and examined this patient; I agree with above documentation in the resident's note.   [name] is a 32 y.o. [phone] here for IOL due to oligo noted on MFM scan today; also with LGA (EFW 9+14); cHTN (no meds in preg)  PE: BP (!) 148/89   Pulse 94   Temp 99.4 F (37.4 C) (Oral)   Resp 18   Ht 5\' 2"  (1.575 m)  Wt 101.4 kg   LMP 10/13/2017 (Approximate)   BMI 40.90 kg/m  Gen: calm comfortable, NAD Resp: normal effort, no distress Abd: gravid  ROS, labs, PMH reviewed  Plan: Admit to [facility] cx ripening with cytotec x 1 dose, followed by Pit most likely in 4hrs Start Labetalol 200 bid for management of borderline BPs in labor Plan for ppBTL Anticipate SVD with preparation for SD  Arabella Merles CNM 07/19/2018, 3:20 PM

## 2018-07-19 NOTE — Anesthesia Pain Management Evaluation Note (Signed)
  CRNA Pain Management Visit Note  Patient: [name], 32 y.o., female  "Hello I am a member of the anesthesia team at [facility]. We have an anesthesia team available at all times to provide care throughout the hospital, including epidural management and anesthesia for C-section. I don't know your plan for the delivery whether it a natural birth, water birth, IV sedation, nitrous supplementation, doula or epidural, but we want to meet your pain goals."   1.Was your pain managed to your expectations on prior hospitalizations?   Yes   2.What is your expectation for pain management during this hospitalization?     Epidural  3.How can we help you reach that goal? epidural  Record the patient's initial score and the patient's pain goal.   Pain: 2  Pain Goal: 5 [facility] wants you to be able to say your pain was always managed very well.  Rehman Levinson 07/19/2018

## 2018-07-19 NOTE — Anesthesia Preprocedure Evaluation (Signed)
Anesthesia Evaluation  Patient identified by MRN, date of birth, ID band Patient awake    Reviewed: Allergy & Precautions, H&P , NPO status , Patient's Chart, lab work & pertinent test results, reviewed documented beta blocker date and time   Airway Mallampati: II  TM Distance: >3 FB Neck ROM: full    Dental no notable dental hx.    Pulmonary neg pulmonary ROS, former smoker,    Pulmonary exam normal breath sounds clear to auscultation       Cardiovascular hypertension, Pt. on medications negative cardio ROS Normal cardiovascular exam Rhythm:regular Rate:Normal     Neuro/Psych negative neurological ROS  negative psych ROS   GI/Hepatic negative GI ROS, Neg liver ROS,   Endo/Other  Morbid obesity  Renal/GU negative Renal ROS  negative genitourinary   Musculoskeletal   Abdominal   Peds  Hematology  (+) Blood dyscrasia, anemia ,   Anesthesia Other Findings   Reproductive/Obstetrics (+) Pregnancy                             Anesthesia Physical Anesthesia Plan  ASA: III  Anesthesia Plan: Epidural   Post-op Pain Management:    Induction:   PONV Risk Score and Plan:   Airway Management Planned:   Additional Equipment:   Intra-op Plan:   Post-operative Plan:   Informed Consent: I have reviewed the patients History and Physical, chart, labs and discussed the procedure including the risks, benefits and alternatives for the proposed anesthesia with the patient or authorized representative who has indicated his/her understanding and acceptance.   Dental Advisory Given  Plan Discussed with:   Anesthesia Plan Comments: (Labs checked- platelets confirmed with RN in room. Fetal heart tracing, per RN, reported to be stable enough for sitting procedure. Discussed epidural, and patient consents to the procedure:  included risk of possible headache,backache, failed block, allergic reaction,  and nerve injury. This patient was asked if she had any questions or concerns before the procedure started.)        Anesthesia Quick Evaluation

## 2018-07-19 NOTE — ED Notes (Signed)
Dr, Judeth CornfieldShankar called report to MD on call. Report called to Bonnielee Haff. Gagnon, RN, CN in BS. Patient escorted to admission desk.

## 2018-07-19 NOTE — Progress Notes (Signed)
LABOR PROGRESS NOTE  [name] is a 32 y.o. G4P3003 at 8184w1d  admitted for IOL 2/2 HTN and oligohydramnios, and fetal size  Subjective: Patient appears well. She states she feels some abdominal cramping but her pain is very managable.   Objective: BP (!) 159/94   Pulse 96   Temp 98.5 F (36.9 C) (Oral)   Resp 16   Ht 5\' 2"  (1.575 m)   Wt 101.4 kg   LMP 10/13/2017 (Approximate)   BMI 40.90 kg/m  or  Vitals:   07/19/18 1215 07/19/18 1324 07/19/18 1633 07/19/18 1637  BP:  (!) 148/89 (!) 165/106 (!) 159/94  Pulse:  94 (!) 106 96  Resp:  18 16   Temp:   98.5 F (36.9 C)   TempSrc:   Oral   Weight: 101.4 kg     Height: 5\' 2"  (1.575 m)      Dilation: 4cm Effacement: 60% Station -1 Presentation: vertex FHT: baseline rate 130, moderate varibility, positive acel, negative decel Toco: 2-7 minutes apart  Labs: Lab Results  Component Value Date   WBC 6.5 07/19/2018   HGB 11.4 (L) 07/19/2018   HCT 34.2 (L) 07/19/2018   MCV 90.7 07/19/2018   PLT 261 07/19/2018    Patient Active Problem List   Diagnosis Date Noted  . Infant large for gestational age 34/25/2019  . Oligohydramnios 07/19/2018  . Anemia in pregnancy 05/07/2018  . Supervision of high risk pregnancy, antepartum 12/24/2017  . Chronic hypertension during pregnancy, antepartum 12/24/2017    Assessment / Plan: 32 y.o. G4P3003 at 5384w1d here for IOL 2/2 HTN and oligohydramnios, fetal size. Patient is hypertensive- systolic 150s. Patient is given labetalol at time of exam.  Labor: initiate pitocin at this time- baby is vertex Fetal Wellbeing:  Category I Pain Control:  Epidural in place Anticipated MOD:  Vaginal delivery  Lanna Labella DO 07/19/2018, 5:35 PM

## 2018-07-20 ENCOUNTER — Inpatient Hospital Stay (HOSPITAL_COMMUNITY): Payer: Medicaid Other | Admitting: Certified Registered Nurse Anesthetist

## 2018-07-20 ENCOUNTER — Encounter (HOSPITAL_COMMUNITY): Payer: Self-pay | Admitting: *Deleted

## 2018-07-20 ENCOUNTER — Encounter (HOSPITAL_COMMUNITY)
Admission: AD | Disposition: A | Discharge status: Home / Self Care | Payer: Self-pay | Source: Home / Self Care | Attending: Family Medicine

## 2018-07-20 DIAGNOSIS — Z3A39 39 weeks gestation of pregnancy: Secondary | ICD-10-CM

## 2018-07-20 DIAGNOSIS — O4103X0 Oligohydramnios, third trimester, not applicable or unspecified: Secondary | ICD-10-CM

## 2018-07-20 DIAGNOSIS — Z302 Encounter for sterilization: Secondary | ICD-10-CM

## 2018-07-20 HISTORY — PX: TUBAL LIGATION: SHX77

## 2018-07-20 LAB — RPR: RPR Ser Ql: NONREACTIVE

## 2018-07-20 SURGERY — LIGATION, FALLOPIAN TUBE, POSTPARTUM
Anesthesia: Epidural | Site: Abdomen | Laterality: Bilateral | Wound class: Clean

## 2018-07-20 MED ORDER — SODIUM CHLORIDE 0.9 % IR SOLN
Status: DC | PRN
  Administered 2018-07-20: 1000 mL

## 2018-07-20 MED ORDER — SENNOSIDES-DOCUSATE SODIUM 8.6-50 MG PO TABS
2.0000 | ORAL_TABLET | ORAL | Status: DC
  Administered 2018-07-20 – 2018-07-22 (×2): 2 via ORAL
  Filled 2018-07-20 (×2): qty 2

## 2018-07-20 MED ORDER — ACETAMINOPHEN 10 MG/ML IV SOLN: 1000.0000 mg | Freq: Once | INTRAVENOUS | Status: DC | PRN

## 2018-07-20 MED ORDER — MIDAZOLAM HCL 2 MG/2ML IJ SOLN
INTRAMUSCULAR | Status: AC
  Filled 2018-07-20: qty 2

## 2018-07-20 MED ORDER — OXYCODONE HCL 5 MG PO TABS
10.0000 mg | ORAL_TABLET | ORAL | Status: DC | PRN
  Administered 2018-07-20 – 2018-07-21 (×4): 10 mg via ORAL
  Filled 2018-07-20 (×4): qty 2

## 2018-07-20 MED ORDER — MISOPROSTOL 200 MCG PO TABS
ORAL_TABLET | ORAL | Status: AC
  Filled 2018-07-20: qty 3

## 2018-07-20 MED ORDER — DIBUCAINE 1 % RE OINT: 1.0000 "application " | TOPICAL_OINTMENT | RECTAL | Status: DC | PRN

## 2018-07-20 MED ORDER — LIDOCAINE-EPINEPHRINE (PF) 2 %-1:200000 IJ SOLN
INTRAMUSCULAR | Status: AC
  Filled 2018-07-20: qty 20

## 2018-07-20 MED ORDER — SODIUM BICARBONATE 8.4 % IV SOLN
INTRAVENOUS | Status: AC
  Filled 2018-07-20: qty 50

## 2018-07-20 MED ORDER — TETANUS-DIPHTH-ACELL PERTUSSIS 5-2.5-18.5 LF-MCG/0.5 IM SUSP: 0.5000 mL | Freq: Once | INTRAMUSCULAR | Status: DC

## 2018-07-20 MED ORDER — BENZOCAINE-MENTHOL 20-0.5 % EX AERO: 1.0000 "application " | INHALATION_SPRAY | CUTANEOUS | Status: DC | PRN

## 2018-07-20 MED ORDER — ONDANSETRON HCL 4 MG/2ML IJ SOLN
INTRAMUSCULAR | Status: AC
  Filled 2018-07-20: qty 2

## 2018-07-20 MED ORDER — FENTANYL CITRATE (PF) 100 MCG/2ML IJ SOLN
INTRAMUSCULAR | Status: AC
  Filled 2018-07-20: qty 2

## 2018-07-20 MED ORDER — LACTATED RINGERS IV SOLN
INTRAVENOUS | Status: DC | PRN
  Administered 2018-07-20: 12:00:00 via INTRAVENOUS

## 2018-07-20 MED ORDER — OXYCODONE HCL 5 MG PO TABS: 5.0000 mg | ORAL_TABLET | Freq: Once | ORAL | Status: DC | PRN

## 2018-07-20 MED ORDER — MEASLES, MUMPS & RUBELLA VAC IJ SOLR
0.5000 mL | Freq: Once | INTRAMUSCULAR | Status: DC
  Filled 2018-07-20: qty 0.5

## 2018-07-20 MED ORDER — OXYCODONE HCL 5 MG PO TABS
5.0000 mg | ORAL_TABLET | ORAL | Status: DC | PRN
  Administered 2018-07-22: 5 mg via ORAL
  Filled 2018-07-20: qty 1

## 2018-07-20 MED ORDER — WITCH HAZEL-GLYCERIN EX PADS: 1.0000 "application " | MEDICATED_PAD | CUTANEOUS | Status: DC | PRN

## 2018-07-20 MED ORDER — DIPHENHYDRAMINE HCL 25 MG PO CAPS: 25.0000 mg | ORAL_CAPSULE | Freq: Four times a day (QID) | ORAL | Status: DC | PRN

## 2018-07-20 MED ORDER — ONDANSETRON HCL 4 MG/2ML IJ SOLN: 4.0000 mg | INTRAMUSCULAR | Status: DC | PRN

## 2018-07-20 MED ORDER — MISOPROSTOL 200 MCG PO TABS: 600.0000 ug | ORAL_TABLET | Freq: Once | ORAL | Status: DC

## 2018-07-20 MED ORDER — ACETAMINOPHEN 325 MG PO TABS
650.0000 mg | ORAL_TABLET | ORAL | Status: DC | PRN
  Administered 2018-07-20 – 2018-07-21 (×3): 650 mg via ORAL
  Filled 2018-07-20 (×3): qty 2

## 2018-07-20 MED ORDER — PROMETHAZINE HCL 25 MG/ML IJ SOLN: 6.2500 mg | INTRAMUSCULAR | Status: DC | PRN

## 2018-07-20 MED ORDER — SIMETHICONE 80 MG PO CHEW
80.0000 mg | CHEWABLE_TABLET | ORAL | Status: DC | PRN
  Administered 2018-07-21 – 2018-07-22 (×2): 80 mg via ORAL
  Filled 2018-07-20 (×2): qty 1

## 2018-07-20 MED ORDER — IBUPROFEN 600 MG PO TABS
600.0000 mg | ORAL_TABLET | Freq: Four times a day (QID) | ORAL | Status: DC
  Administered 2018-07-20 – 2018-07-22 (×7): 600 mg via ORAL
  Filled 2018-07-20 (×7): qty 1

## 2018-07-20 MED ORDER — OXYCODONE HCL 5 MG/5ML PO SOLN: 5.0000 mg | Freq: Once | ORAL | Status: DC | PRN

## 2018-07-20 MED ORDER — BUPIVACAINE HCL (PF) 0.25 % IJ SOLN
INTRAMUSCULAR | Status: DC | PRN
  Administered 2018-07-20: 20 mL

## 2018-07-20 MED ORDER — PRENATAL MULTIVITAMIN CH
1.0000 | ORAL_TABLET | Freq: Every day | ORAL | Status: DC
  Administered 2018-07-21: 1 via ORAL
  Filled 2018-07-20: qty 1

## 2018-07-20 MED ORDER — MORPHINE SULFATE (PF) 0.5 MG/ML IJ SOLN
INTRAMUSCULAR | Status: AC
  Filled 2018-07-20: qty 10

## 2018-07-20 MED ORDER — SODIUM BICARBONATE 8.4 % IV SOLN
INTRAVENOUS | Status: DC | PRN
  Administered 2018-07-20 (×4): 5 mL via EPIDURAL

## 2018-07-20 MED ORDER — FENTANYL CITRATE (PF) 100 MCG/2ML IJ SOLN
25.0000 ug | INTRAMUSCULAR | Status: DC | PRN
  Administered 2018-07-20: 50 ug via INTRAVENOUS

## 2018-07-20 MED ORDER — COCONUT OIL OIL: 1.0000 "application " | TOPICAL_OIL | Status: DC | PRN

## 2018-07-20 MED ORDER — MIDAZOLAM HCL 5 MG/5ML IJ SOLN
INTRAMUSCULAR | Status: DC | PRN
  Administered 2018-07-20: 2 mg via INTRAVENOUS

## 2018-07-20 MED ORDER — FENTANYL CITRATE (PF) 100 MCG/2ML IJ SOLN
INTRAMUSCULAR | Status: DC | PRN
  Administered 2018-07-20: 100 ug via EPIDURAL

## 2018-07-20 MED ORDER — ONDANSETRON HCL 4 MG PO TABS: 4.0000 mg | ORAL_TABLET | ORAL | Status: DC | PRN

## 2018-07-20 MED ORDER — OXYTOCIN 10 UNIT/ML IJ SOLN
INTRAMUSCULAR | Status: AC
  Filled 2018-07-20: qty 4

## 2018-07-20 MED ORDER — BUPIVACAINE HCL (PF) 0.25 % IJ SOLN
INTRAMUSCULAR | Status: AC
  Filled 2018-07-20: qty 30

## 2018-07-20 SURGICAL SUPPLY — 28 items
BLADE SURG 11 STRL SS (BLADE) ×3 IMPLANT
CLIP FILSHIE TUBAL LIGA STRL (Clip) ×3 IMPLANT
CLOTH BEACON ORANGE TIMEOUT ST (SAFETY) ×3 IMPLANT
DRSG OPSITE POSTOP 3X4 (GAUZE/BANDAGES/DRESSINGS) ×3 IMPLANT
DURAPREP 26ML APPLICATOR (WOUND CARE) ×3 IMPLANT
ELECT REM PT RETURN 9FT ADLT (ELECTROSURGICAL) ×3
ELECTRODE REM PT RTRN 9FT ADLT (ELECTROSURGICAL) ×1 IMPLANT
GAUZE 4X4 16PLY RFD (DISPOSABLE) ×3 IMPLANT
GLOVE BIOGEL PI IND STRL 7.0 (GLOVE) ×1 IMPLANT
GLOVE BIOGEL PI IND STRL 7.5 (GLOVE) ×1 IMPLANT
GLOVE BIOGEL PI INDICATOR 7.0 (GLOVE) ×2
GLOVE BIOGEL PI INDICATOR 7.5 (GLOVE) ×2
GLOVE ECLIPSE 7.5 STRL STRAW (GLOVE) ×3 IMPLANT
GOWN STRL REUS W/TWL LRG LVL3 (GOWN DISPOSABLE) ×6 IMPLANT
NEEDLE HYPO 22GX1.5 SAFETY (NEEDLE) ×3 IMPLANT
NS IRRIG 1000ML POUR BTL (IV SOLUTION) ×3 IMPLANT
PACK ABDOMINAL MINOR (CUSTOM PROCEDURE TRAY) ×3 IMPLANT
PENCIL BUTTON HOLSTER BLD 10FT (ELECTRODE) ×3 IMPLANT
SPONGE LAP 4X18 RFD (DISPOSABLE) IMPLANT
SUT PLAIN 2 0 (SUTURE) ×2
SUT PLAIN ABS 2-0 54XMFL TIE (SUTURE) ×1 IMPLANT
SUT VIC AB 3-0 SH 27 (SUTURE) ×2
SUT VIC AB 3-0 SH 27X BRD (SUTURE) ×1 IMPLANT
SUT VICRYL 0 UR6 27IN ABS (SUTURE) ×3 IMPLANT
SUT VICRYL 4-0 PS2 18IN ABS (SUTURE) ×3 IMPLANT
SYR CONTROL 10ML LL (SYRINGE) ×3 IMPLANT
TOWEL OR 17X24 6PK STRL BLUE (TOWEL DISPOSABLE) ×6 IMPLANT
WATER STERILE IRR 1000ML POUR (IV SOLUTION) ×3 IMPLANT

## 2018-07-20 NOTE — Transfer of Care (Signed)
Immediate Anesthesia Transfer of Care Note  Patient: [name]  Procedure(s) Performed: POST PARTUM TUBAL LIGATION (Bilateral Abdomen)  Patient Location: PACU  Anesthesia Type:Epidural  Level of Consciousness: awake, alert  and oriented  Airway & Oxygen Therapy: Patient Spontanous Breathing  Post-op Assessment: Report given to RN and Post -op Vital signs reviewed and stable  Post vital signs: Reviewed and stable  Last Vitals:  Vitals Value Taken Time  BP 121/75 07/20/2018 12:25 PM  Temp    Pulse 98 07/20/2018 12:27 PM  Resp 20 07/20/2018 12:27 PM  SpO2 97 % 07/20/2018 12:27 PM  Vitals shown include unvalidated device data.  Last Pain:  Vitals:   07/20/18 1040  TempSrc:   PainSc: 0-No pain      Patients Stated Pain Goal: 6 (07/20/18 0725)  Complications: No apparent anesthesia complications

## 2018-07-20 NOTE — Anesthesia Postprocedure Evaluation (Signed)
Anesthesia Post Note  Patient: [name]  Procedure(s) Performed: POST PARTUM TUBAL LIGATION (Bilateral Abdomen)     Patient location during evaluation: PACU Anesthesia Type: Epidural Level of consciousness: awake and alert Pain management: pain level controlled Vital Signs Assessment: post-procedure vital signs reviewed and stable Respiratory status: spontaneous breathing, nonlabored ventilation and respiratory function stable Cardiovascular status: blood pressure returned to baseline and stable Postop Assessment: no apparent nausea or vomiting and epidural receding Anesthetic complications: no    Last Vitals:  Vitals:   07/20/18 1240 07/20/18 1245  BP:  125/81  Pulse: 94 99  Resp: 18 19  Temp:    SpO2: 97% 96%    Last Pain:  Vitals:   07/20/18 1245  TempSrc:   PainSc: Asleep   Pain Goal: Patients Stated Pain Goal: 6 (07/20/18 0725)               Kaylyn LayerKathryn E Nashid Pellum

## 2018-07-20 NOTE — Progress Notes (Signed)
Patient ID: [name], female   DOB: 08/09/1986, 32 y.o.   MRN: [id number]  Risks of procedure discussed with patient including but not limited to: risk of regret, permanence of method, bleeding, infection, injury to surrounding organs and need for additional procedures.  Failure risk of 1 -2 % with increased risk of ectopic gestation if pregnancy occurs was also discussed with patient.    Levie HeritageStinson, Arvo Ealy J, DO 07/20/2018 10:19 AM

## 2018-07-20 NOTE — Progress Notes (Addendum)
[name] is a 32 y.o. [id number] at 7060w2d admitted for induction of labor due to Low amniotic fluid.  Subjective: Patient pushing well with RN, FOB at bedside.  Objective: BP (!) 153/91   Pulse (!) 102   Temp 98.1 F (36.7 C) (Oral)   Resp 18   Ht 5\' 2"  (1.575 m)   Wt 101.4 kg   LMP 10/13/2017 (Approximate)   SpO2 96%   BMI 40.90 kg/m  No intake/output data recorded. No intake/output data recorded.  FHT:  FHR: 150 bpm, variability: moderate,  accelerations:  Present,  decelerations:  Absent UC:   regular, every 2-3 minutes SVE:   Dilation: 10 Effacement (%): 100 Station: Plus 2 Exam by:: Enis SlipperJane Bailey, RN  Labs: Lab Results  Component Value Date   WBC 6.5 07/19/2018   HGB 11.4 (L) 07/19/2018   HCT 34.2 (L) 07/19/2018   MCV 90.7 07/19/2018   PLT 261 07/19/2018    Assessment / Plan: Induction of labor due to oligo,  progressing well on pitocin  Labor: Progressing normally Preeclampsia:  no signs or symptoms of toxicity Fetal Wellbeing:  Category I Pain Control:  Epidural I/D:  n/a Anticipated MOD:  NSVD  Bernerd LimboJamilla R Walker, SNM 07/20/2018, 8:59 AM  I confirm that I have verified the information documented in the SNM's note and that I have also personally reperformed the physical exam and all medical decision making activities.  Clayton BiblesSamantha Nakiesha Rumsey, CNM 07/20/18  10:20 AM

## 2018-07-20 NOTE — Anesthesia Preprocedure Evaluation (Signed)
Anesthesia Evaluation  Patient identified by MRN, date of birth, ID band Patient awake    Reviewed: Allergy & Precautions, NPO status , Patient's Chart, lab work & pertinent test results  Airway Mallampati: II  TM Distance: >3 FB Neck ROM: Full    Dental  (+) Teeth Intact, Dental Advisory Given   Pulmonary former smoker,    Pulmonary exam normal breath sounds clear to auscultation       Cardiovascular hypertension, Normal cardiovascular exam Rhythm:Regular Rate:Normal     Neuro/Psych negative neurological ROS  negative psych ROS   GI/Hepatic negative GI ROS, Neg liver ROS,   Endo/Other  Morbid obesity  Renal/GU negative Renal ROS  negative genitourinary   Musculoskeletal negative musculoskeletal ROS (+)   Abdominal   Peds negative pediatric ROS (+)  Hematology negative hematology ROS (+)   Anesthesia Other Findings   Reproductive/Obstetrics negative OB ROS                             Anesthesia Physical Anesthesia Plan  ASA: III  Anesthesia Plan: Epidural   Post-op Pain Management:    Induction:   PONV Risk Score and Plan: 2 and Treatment may vary due to age or medical condition  Airway Management Planned: Natural Airway  Additional Equipment:   Intra-op Plan:   Post-operative Plan:   Informed Consent: I have reviewed the patients History and Physical, chart, labs and discussed the procedure including the risks, benefits and alternatives for the proposed anesthesia with the patient or authorized representative who has indicated his/her understanding and acceptance.     Plan Discussed with: CRNA  Anesthesia Plan Comments:         Anesthesia Quick Evaluation

## 2018-07-20 NOTE — Progress Notes (Addendum)
[name] is a 32 y.o. [id number] at 1797w2d admitted for induction of labor due to Low amniotic fluid..  Subjective: Patient complete and pushing, but very upset and crying that she cannot push anymore, wants a doctor to come "get her out." Stopped pushing with contractions, baby continued to descend and rotate without maternal pushing effort.   Objective: BP (!) 155/88   Pulse (!) 102   Temp 99 F (37.2 C) (Axillary)   Resp 18   Ht 5\' 2"  (1.575 m)   Wt 101.4 kg   LMP 10/13/2017 (Approximate)   SpO2 96%   BMI 40.90 kg/m  No intake/output data recorded.  FHT:  FHR: 150 bpm, variability: absent,  accelerations:  Present,  decelerations:  Absent UC:   regular, every 2-3 minutes SVE:   Dilation: 10 Effacement (%): 100 Station: Plus 2 Exam by:: Anna SlipperJane Bailey, [name]  Labs: Lab Results  Component Value Date   WBC 6.5 07/19/2018   HGB 11.4 (L) 07/19/2018   HCT 34.2 (L) 07/19/2018   MCV 90.7 07/19/2018   PLT 261 07/19/2018    Assessment / Plan: Induction of labor due to oligo,  progressing well on pitocin  Dr. Adrian BlackwaterStinson notified. Discussed most recent cervical exam and patient status. Patient to labor down until 10:30am, then reassessed for possible vacuum assisted delivery.  Labor: Progressing normally Preeclampsia:  no signs or symptoms of toxicity Fetal Wellbeing:  Category I Pain Control:  Epidural I/D:  n/a Anticipated MOD:  NSVD  Bernerd LimboJamilla R Walker, SNM 07/20/2018, 10:23 AM   I confirm that I have verified the information documented in the SNM's note and that I have also personally reperformed the physical exam and all medical decision making activities.  Clayton BiblesSamantha , CNM 07/20/18  10:48 AM

## 2018-07-20 NOTE — Lactation Note (Addendum)
This note was copied from a baby's chart. Lactation Consultation Note  Patient Name: [name] [id number]'[id number]'s Date: 07/20/2018 Reason for consult: Initial assessment;1st time breastfeeding;Term P4, 11 hour female infant. [facility] [place] Mom did not attend any BF classes in her pregnancy. BF concerns: This is mom's first time BF and she did not latch infant in L&D. Has slight inverted nipple (dimple) on left breast but doesn't need breast shells or NS at this time infant latches to breast easily.  Per mom, she is feeling good about breastfeeding her baby. She had decided after delivery to  formula feed only but now her current feeding choice is breast and bottle feed her baby. LC discussed hand expression and mom taught back easily expressed 16 ml of colostrum and infant received 8 ml of colostrum on spoon. Mom latched infant to right breast using football hold, swallows heard by LC, LC reminded mom nose to breast for deeper latch.infant breastfeed for 13 minutes. Mom had slight ly inverted nipple (dimple) on left breast her bands are not tight, colostrum is easily expressed and infant latches easily to breast.  Mom doesn't need  NS or breast shells at this time.  LC gave mom hand pump (medela) for prn, explained how to assemble, reassemble, clean and store breast milk. LC fitted mom with  27 mm breast flange for harnony breastpump. LC discussed I & O. Reviewed Baby & Me book's Breastfeeding Basics.  Mom made aware of O/P services, breastfeeding support groups, community resources, and our phone # for post-discharge questions.  Mom's current goals: 1.BF according hunger cues, 8 to 12 times within 24 hours. 2. Mom will BF more and offer less formula. 3. Mom will ask Nurse or LC for assistance if she has any concerns, questions or need assistance with latching infant to breast.  Maternal Data Formula Feeding for Exclusion: No Has patient been taught Hand Expression?:  Yes(Mom hand expressed 16 ml of colostrum.) Does the patient have breastfeeding experience prior to this delivery?: No  Feeding Feeding Type: Breast Fed  LATCH Score Latch: Grasps breast easily, tongue down, lips flanged, rhythmical sucking.  Audible Swallowing: Spontaneous and intermittent  Type of Nipple: Inverted(left breast only )  Comfort (Breast/Nipple): Filling, red/small blisters or bruises, mild/mod discomfort  Hold (Positioning): Assistance needed to correctly position infant at breast and maintain latch.  LATCH Score: 6  Interventions Interventions: Breast feeding basics reviewed;Assisted with latch;Skin to skin;Adjust position;Breast compression;Breast massage;Support pillows;Position options;Hand express  Lactation Tools Discussed/Used WIC Program: Yes Pump Review: Milk Storage Initiated by:: Danelle Earthlyobin Massiel Stipp, IBCLC Date initiated:: 07/20/18   Consult Status Consult Status: Follow-up Date: 07/21/18 Follow-up type: In-patient    Danelle EarthlyRobin Brendin Situ 07/20/2018, 9:39 PM

## 2018-07-20 NOTE — Progress Notes (Signed)
LABOR PROGRESS NOTE  [name] is a 32 y.o. [phone] at 4457w2d  admitted for IOL for oligo  Subjective: Patient reports increased pressure in bottom during contractions   Objective: BP 140/89   Pulse 89   Temp 98.1 F (36.7 C) (Oral)   Resp 16   Ht 5\' 2"  (1.575 m)   Wt 101.4 kg   LMP 10/13/2017 (Approximate)   SpO2 96%   BMI 40.90 kg/m  or  Vitals:   07/20/18 0602 07/20/18 0625 07/20/18 0630 07/20/18 0700  BP: 133/77  132/85 140/89  Pulse: 87  91 89  Resp:  18  16  Temp:  98.1 F (36.7 C)    TempSrc:  Oral    SpO2: 96%     Weight:      Height:        Pitocin currently at 28 milli-unit/min  Dilation: 7 Effacement (%): 90 Cervical Position: Middle Station: 0 Presentation: Vertex Exam by:: Anna Lore. Sullivan, RN FHT: baseline rate 145, minimal-moderate varibility, +accel (10x10), early decel MVUs: 230  Labs: Lab Results  Component Value Date   WBC 6.5 07/19/2018   HGB 11.4 (L) 07/19/2018   HCT 34.2 (L) 07/19/2018   MCV 90.7 07/19/2018   PLT 261 07/19/2018    Patient Active Problem List   Diagnosis Date Noted  . Infant large for gestational age 68/25/2019  . Oligohydramnios 07/19/2018  . Anemia in pregnancy 05/07/2018  . Supervision of high risk pregnancy, antepartum 12/24/2017  . Chronic hypertension during pregnancy, antepartum 12/24/2017    Assessment / Plan: 32 y.o. G4P3003 at 257w2d here for IOL for oligo   Labor: Progressing well with pitocin and AROM  Fetal Wellbeing:  Cat II Pain Control:  Epidural  Anticipated MOD:  SVD  Sharyon CableRogers, Devri Kreher C, CNM 07/20/2018, 7:24 AM

## 2018-07-20 NOTE — Progress Notes (Signed)
LABOR PROGRESS NOTE  Carita PianLatoya R Lemire is a 32 y.o. [phone] at 8869w2d  admitted for IOL for oligo   Subjective: Patient comfortable with epidural  Objective: BP 115/72 (BP Location: Right Arm)   Pulse 82   Temp 98.3 F (36.8 C) (Oral)   Resp 16   Ht 5\' 2"  (1.575 m)   Wt 101.4 kg   LMP 10/13/2017 (Approximate)   SpO2 96%   BMI 40.90 kg/m  or  Vitals:   07/19/18 2215 07/19/18 2230 07/19/18 2300 07/19/18 2330  BP: 135/80 137/81 128/83 115/72  Pulse: 75 84 82 82  Resp: 16 17 15 16   Temp:      TempSrc:      SpO2: 98% 96% 95% 96%  Weight:      Height:        Currently on 12 milli-unit/min of pitocin  Dilation: 4 Effacement (%): 50 Cervical Position: Anterior(to pt's right) Station: -1 Presentation: Vertex Exam by:: Belinda FisherZ. Parrish, RN BSN FHT: baseline rate 135, moderate varibility, +accel, no decel Toco: 3-6  Labs: Lab Results  Component Value Date   WBC 6.5 07/19/2018   HGB 11.4 (L) 07/19/2018   HCT 34.2 (L) 07/19/2018   MCV 90.7 07/19/2018   PLT 261 07/19/2018    Patient Active Problem List   Diagnosis Date Noted  . Infant large for gestational age 64/25/2019  . Oligohydramnios 07/19/2018  . Anemia in pregnancy 05/07/2018  . Supervision of high risk pregnancy, antepartum 12/24/2017  . Chronic hypertension during pregnancy, antepartum 12/24/2017    Assessment / Plan: 32 y.o. G4P3003 at 7169w2d here for IOL for oligo   Labor: Continue titration of pitocin until active labor, possible AROM with next cervical examination  Fetal Wellbeing:  Cat I Pain Control:  Epidural Anticipated MOD: SVD  Sharyon CableRogers, Rashid Whitenight C, CNM 07/20/2018, 12:02 AM

## 2018-07-20 NOTE — Progress Notes (Signed)
LABOR PROGRESS NOTE  [name] is a 32 y.o. [phone] at 5136w2d  admitted for IOL for oligo   Subjective: Patient comfortable with epidural, patient asleep in room upon assessment   Objective: BP 135/90   Pulse 83   Temp 97.8 F (36.6 C) (Axillary)   Resp 16   Ht 5\' 2"  (1.575 m)   Wt 101.4 kg   LMP 10/13/2017 (Approximate)   SpO2 94%   BMI 40.90 kg/m  or  Vitals:   07/20/18 0130 07/20/18 0230 07/20/18 0245 07/20/18 0300  BP: 135/85 139/80  135/90  Pulse: 88 83  83  Resp: 16 15  16   Temp: 98.3 F (36.8 C)  97.8 F (36.6 C)   TempSrc: Oral  Axillary   SpO2:  95%  94%  Weight:      Height:        AROM @0116 - small amount of bloody fluid, IUPC placed  Currently on 7822milli-unit/min of pitocin  Dilation: 4.5 Effacement (%): 60 Cervical Position: Middle Station: -2 Presentation: Vertex Exam by:: [name], [name] FHT: baseline rate 140, minimal- moderate varibility, +accel, early decel Toco: 3-5 minutes   Labs: Lab Results  Component Value Date   WBC 6.5 07/19/2018   HGB 11.4 (L) 07/19/2018   HCT 34.2 (L) 07/19/2018   MCV 90.7 07/19/2018   PLT 261 07/19/2018    Patient Active Problem List   Diagnosis Date Noted  . Infant large for gestational age 68/25/2019  . Oligohydramnios 07/19/2018  . Anemia in pregnancy 05/07/2018  . Supervision of high risk pregnancy, antepartum 12/24/2017  . Chronic hypertension during pregnancy, antepartum 12/24/2017    Assessment / Plan: 32 y.o. G4P3003 at 4436w2d here for IOL for oligo   Labor: no cervical change since 1700, AROM and IUPC placed, continue to titrate pitocin until active labor  Fetal Wellbeing:  Cat I  Pain Control:  Epidural  Anticipated MOD:  SVD  Sharyon CableRogers, Ayiden Milliman C, [name] 07/20/2018, 1:48 AM

## 2018-07-20 NOTE — Op Note (Signed)
[name]  07/20/2018  PREOPERATIVE DIAGNOSIS:  Multiparity, undesired fertility  POSTOPERATIVE DIAGNOSIS:  Multiparity, undesired fertility  PROCEDURE:  Postpartum Bilateral Tubal Sterilization using Filshie Clips   ANESTHESIA:  Epidural and local analgesia using 0.25% Marcaine  COMPLICATIONS:  None immediate.  ESTIMATED BLOOD LOSS: 5 ml.  INDICATIONS: 32 y.o. [id number]  with undesired fertility,status post vaginal delivery, desires permanent sterilization.  Other reversible forms of contraception were discussed with patient; she declines all other modalities. Risks of procedure discussed with patient including but not limited to: risk of regret, permanence of method, bleeding, infection, injury to surrounding organs and need for additional procedures.  Failure risk of 0.5-1% with increased risk of ectopic gestation if pregnancy occurs was also discussed with patient.     FINDINGS:  Normal uterus, tubes, and ovaries.  TECHNIQUE:  The patient was taken to the operating room where her epidural anesthesia was dosed up to surgical level and found to be adequate.  She was then placed in the dorsal supine position and prepped and draped in sterile fashion.  After an adequate timeout was performed, attention was turned to the patient's abdomen where a small transverse skin incsion was made under the umbilical fold. The incision was taken down to the layer of fascia using the scalpel, and fascia was incised, and extended bilaterally using Mayo scissors. The peritoneum was entered in a sharp fashion. Attention was then turned to the patient's uterus, and left fallopian tube was identified and followed out to the fimbriated end.  A tear was made in the mesosalpinx requiring repair with a 3.0 vicryl with good hemostasis.  A Filshie clip was placed on the left fallopian tube about 2 cm from the cornual attachment, with care given to incorporate the underlying mesosalpinx. A similar process was carried  out on the rightl side allowing for bilateral tubal sterilization.  Good hemostasis was noted overall. The instruments were then removed from the patient's abdomen and the fascial incision was repaired with 0 Vicryl, and the skin was closed with a 3-0 Monocryl subcuticular stitch. The patient tolerated the procedure well.  Sponge, lap, and needle counts were correct times two.  The patient was then taken to the recovery room awake, extubated and in stable condition.  Anna AbbotNimeka Nyelle Wolfson MD Ob Fellow 07/20/2018 1:58 PM

## 2018-07-21 ENCOUNTER — Other Ambulatory Visit: Payer: Medicaid Other

## 2018-07-21 ENCOUNTER — Encounter: Payer: Medicaid Other | Admitting: Obstetrics & Gynecology

## 2018-07-21 ENCOUNTER — Encounter: Payer: Medicaid Other | Admitting: Internal Medicine

## 2018-07-21 MED ORDER — IBUPROFEN 600 MG PO TABS: 600.0000 mg | ORAL_TABLET | Freq: Four times a day (QID) | ORAL | 0 refills | Status: DC

## 2018-07-21 MED ORDER — OXYCODONE HCL 5 MG PO TABS: 5.0000 mg | ORAL_TABLET | Freq: Four times a day (QID) | ORAL | 0 refills | Status: DC | PRN

## 2018-07-21 MED ORDER — ASPIRIN 81 MG PO TABS: 81.0000 mg | ORAL_TABLET | Freq: Every day | ORAL | 0 refills | Status: AC

## 2018-07-21 MED ORDER — AMLODIPINE BESYLATE 5 MG PO TABS
5.0000 mg | ORAL_TABLET | Freq: Every day | ORAL | Status: DC
  Administered 2018-07-21 – 2018-07-22 (×2): 5 mg via ORAL
  Filled 2018-07-21 (×2): qty 1

## 2018-07-21 MED ORDER — AMLODIPINE BESYLATE 5 MG PO TABS: 5.0000 mg | ORAL_TABLET | Freq: Every day | ORAL | 1 refills | Status: DC

## 2018-07-21 NOTE — Discharge Summary (Signed)
Obstetrics Discharge Summary OB/GYN Faculty Practice   Patient Name: [name] DOB: November 07, 1985 MRN: [id number]  Date of admission: 07/19/2018 Delivering MD: Edd ArbourWALKER, JAMILLA R   Date of discharge: 07/21/2018  Admitting diagnosis: 39wks oligo  Intrauterine pregnancy: 6175w2d     Secondary diagnosis:   Active Problems:   Supervision of high risk pregnancy, antepartum   Chronic hypertension during pregnancy, antepartum   Anemia in pregnancy   Infant large for gestational age   Oligohydramnios  Additional problems:  . Desires permanent sterility      Discharge diagnosis: Term Pregnancy Delivered after induction for oligohydramnios                              Postpartum procedures: BTL Complications: none  Outpatient Follow-Up: [ ]  Blood pressure check - started on amlodipine 5mg  daily on PPD#1 for moderate range Bps [ ]  BTL incision check   Hospital course: Anna PianLatoya R Lyday is a 32 y.o. 2275w2d who was admitted for IOL for oligohydramnios. Her pregnancy was complicated by cHTN, oligohydramnios, BMI 41. Her labor course was notable for cytotec ripening, pitocin titration, epidural placement, and AROM She was also started on labetalol intrapartum for moderate range Bps - CBC, CMP, and UPC wnl. Delivery was uncomplicated. Please see delivery/op note for additional details. Her postpartum course was uncomplicated. She was breastfeeding without difficulty. By day of discharge, she was passing flatus, urinating, eating and drinking without difficulty. Her pain was well-controlled, and she was discharged home with ibuprofen and oxycodone 5mg  #10 for post-op BTL pain. She will follow-up in clinic in 1-2 weeks for BP check and in 4-6 weeks for routine postpartum visit.   Physical exam  Vitals:   07/20/18 2330 07/21/18 0400 07/21/18 0816 07/21/18 1400  BP: 138/78 140/85 (!) 147/94 (!) 142/85  Pulse: 89 90 97 93  Resp: 18 18 19    Temp: 98.6 F (37 C) 98.7 F (37.1 C) 98 F (36.7 C) 98.5 F  (36.9 C)  TempSrc: Oral Oral Oral Oral  SpO2:   99% 97%  Weight:      Height:       General: well-appearing, NAD Lochia: appropriate Uterine Fundus: firm Incision: infraumbilical dressing in place - dried blood on honeycomb, appropriately tender DVT Evaluation: No significant calf/ankle edema. Labs: Lab Results  Component Value Date   WBC 6.5 07/19/2018   HGB 11.4 (L) 07/19/2018   HCT 34.2 (L) 07/19/2018   MCV 90.7 07/19/2018   PLT 261 07/19/2018   CMP Latest Ref Rng & Units 07/19/2018  Glucose 70 - 99 mg/dL 56(O67(L)  BUN 6 - 20 mg/dL <1(H<5(L)  Creatinine 0.860.44 - 1.00 mg/dL 5.78(I0.38(L)  Sodium 696135 - 295145 mmol/L 137  Potassium 3.5 - 5.1 mmol/L 3.7  Chloride 98 - 111 mmol/L 107  CO2 22 - 32 mmol/L 21(L)  Calcium 8.9 - 10.3 mg/dL 2.8(U8.7(L)  Total Protein 6.5 - 8.1 g/dL 6.3(L)  Total Bilirubin 0.3 - 1.2 mg/dL 1.0  Alkaline Phos 38 - 126 U/L 131(H)  AST 15 - 41 U/L 18  ALT 0 - 44 U/L 13    Discharge instructions: Per After Visit Summary and "Baby and Me Booklet"  After visit meds:  Allergies as of 07/21/2018   No Known Allergies     Medication List    STOP taking these medications   promethazine 25 MG tablet Commonly known as:  PHENERGAN     TAKE these medications   amLODipine  5 MG tablet Commonly known as:  NORVASC Take 1 tablet (5 mg total) by mouth daily.   aspirin 81 MG tablet Take 1 tablet (81 mg total) by mouth daily.   ferrous gluconate 324 MG tablet Commonly known as:  FERGON Take 1 tablet (324 mg total) by mouth 2 (two) times daily with a meal.   ibuprofen 600 MG tablet Commonly known as:  ADVIL,MOTRIN Take 1 tablet (600 mg total) by mouth every 6 (six) hours.   PRENATAL 19 tablet Take 1 tablet by mouth daily.       Postpartum contraception: Tubal Ligation done postpartum  Diet: Routine Diet Activity: Advance as tolerated. Pelvic rest for 6 weeks.   Follow-up Appt:No future appointments. Follow-up Visit:No follow-ups on file.  Newborn Data: Live  born female  Birth Weight: 8 lb 9.2 oz (3890 g) APGAR: 9, 9  Newborn Delivery   Birth date/time:  07/20/2018 10:02:00 Delivery type:  Vaginal, Spontaneous    Baby Feeding: Both Disposition:home with mother  Cristal Deer. Earlene Plater, DO OB/GYN Fellow, Faculty Practice

## 2018-07-21 NOTE — Anesthesia Postprocedure Evaluation (Signed)
Anesthesia Post Note  Patient: Chaunta R Yasin  Procedure(s) Performed: AN AD HOC LABOR EPIDURAL     Patient location during evaluation: Mother Baby Anesthesia Type: Epidural Level of consciousness: awake and alert Pain management: pain level controlled Vital Signs Assessment: post-procedure vital signs reviewed and stable Respiratory status: spontaneous breathing, nonlabored ventilation and respiratory function stable Cardiovascular status: stable Postop Assessment: no headache, no backache, epidural receding, able to ambulate, adequate PO intake, no apparent nausea or vomiting and patient able to bend at knees Anesthetic complications: no    Last Vitals:  Vitals:   07/20/18 2330 07/21/18 0400  BP: 138/78 140/85  Pulse: 89 90  Resp: 18 18  Temp: 37 C 37.1 C  SpO2:      Last Pain:  Vitals:   07/21/18 0400  TempSrc: Oral  PainSc: 4    Pain Goal: Patients Stated Pain Goal: 4 (07/20/18 1930)               Laban EmperorMalinova,Louisa Favaro Hristova

## 2018-07-21 NOTE — Progress Notes (Signed)
POSTPARTUM PROGRESS NOTE  Post Partum Day 1   Subjective:  [name] is a 32 y.o. [id number] s/p SVD and BTL at 5366w2d.  She reports she is doing well. No acute events overnight. She denies any problems with ambulating or po intake. Denies nausea or vomiting.  Pain is well controlled, she required oxycodone once last night but has otherwise controlled with Ibuprofen. Lochia is improving. No BM yet but passing flatus.     Objective: Blood pressure (!) 147/94, pulse 97, temperature 98 F (36.7 C), temperature source Oral, resp. rate 19, height 5\' 2"  (1.575 m), weight 101.4 kg, last menstrual period 10/13/2017, SpO2 99 %, unknown if currently breastfeeding.  Physical Exam:  General: alert, cooperative and no distress Chest: no respiratory distress Heart:regular rate, distal pulses intact Abdomen: soft, nontender Uterine Fundus: firm, appropriately tender DVT Evaluation: No calf swelling or tenderness Extremities: No edema Skin: warm, dry  Recent Labs    07/19/18 1226  HGB 11.4*  HCT 34.2*    Assessment/Plan: [name] is a 32 y.o. G4W1027G4P4004 s/p SVD and BTL at 4566w2d.   PPD#1 - Doing well  Routine postpartum care Contraception: BTL completed 11/26 Feeding: Breast CHTN: Given her history and persistently elevated BP's during her stay, we started her on 5mg  Amlodipine today. [facility] Dispo: Plan for discharge: Possible d/c tomorrow   LOS: 2 days   Marcy Sirenatherine Wallace, D.O. OB Fellow  07/21/2018, 9:13 AM

## 2018-07-22 NOTE — Lactation Note (Signed)
This note was copied from a baby's chart. Lactation Consultation Note  Patient Name: Anna Birdena JubileeLatoya Reckart YNWGN'FToday's Date: 07/22/2018   Per RN, there's no need for lactation to visit Mom. She has not put infant to breast in over 48 hours.   If Mom were to change her mind at a later point, Mom is noted to be on amlodipine 5 mg (L3).   Lurline HareRichey, Laird Runnion [facility] 07/22/2018, 7:50 AM

## 2018-07-22 NOTE — Discharge Instructions (Signed)
Postpartum Care After Vaginal Delivery °The period of time right after you deliver your newborn is called the postpartum period. °What kind of medical care will I receive? °· You may continue to receive fluids and medicines through an IV tube inserted into one of your veins. °· If an incision was made near your vagina (episiotomy) or if you had some vaginal tearing during delivery, cold compresses may be placed on your episiotomy or your tear. This helps to reduce pain and swelling. °· You may be given a squirt bottle to use when you go to the bathroom. You may use this until you are comfortable wiping as usual. To use the squirt bottle, follow these steps: °? Before you urinate, fill the squirt bottle with warm water. Do not use hot water. °? After you urinate, while you are sitting on the toilet, use the squirt bottle to rinse the area around your urethra and vaginal opening. This rinses away any urine and blood. °? You may do this instead of wiping. As you start healing, you may use the squirt bottle before wiping yourself. Make sure to wipe gently. °? Fill the squirt bottle with clean water every time you use the bathroom. °· You will be given sanitary pads to wear. °How can I expect to feel? °· You may not feel the need to urinate for several hours after delivery. °· You will have some soreness and pain in your abdomen and vagina. °· If you are breastfeeding, you may have uterine contractions every time you breastfeed for up to several weeks postpartum. Uterine contractions help your uterus return to its normal size. °· It is normal to have vaginal bleeding (lochia) after delivery. The amount and appearance of lochia is often similar to a menstrual period in the first week after delivery. It will gradually decrease over the next few weeks to a dry, yellow-brown discharge. For most women, lochia stops completely by 6-8 weeks after delivery. Vaginal bleeding can vary from woman to woman. °· Within the first few  days after delivery, you may have breast engorgement. This is when your breasts feel heavy, full, and uncomfortable. Your breasts may also throb and feel hard, tightly stretched, warm, and tender. After this occurs, you may have milk leaking from your breasts. Your health care provider can help you relieve discomfort due to breast engorgement. Breast engorgement should go away within a few days. °· You may feel more sad or worried than normal due to hormonal changes after delivery. These feelings should not last more than a few days. If these feelings do not go away after several days, speak with your health care provider. °How should I care for myself? °· Tell your health care provider if you have pain or discomfort. °· Drink enough water to keep your urine clear or pale yellow. °· Wash your hands thoroughly with soap and water for at least 20 seconds after changing your sanitary pads, after using the toilet, and before holding or feeding your baby. °· If you are not breastfeeding, avoid touching your breasts a lot. Doing this can make your breasts produce more milk. °· If you become weak or lightheaded, or you feel like you might faint, ask for help before: °? Getting out of bed. °? Showering. °· Change your sanitary pads frequently. Watch for any changes in your flow, such as a sudden increase in volume, a change in color, the passing of large blood clots. If you pass a blood clot from your vagina, save it   to show to your health care provider. Do not flush blood clots down the toilet without having your health care provider look at them. °· Make sure that all your vaccinations are up to date. This can help protect you and your baby from getting certain diseases. You may need to have immunizations done before you leave the hospital. °· If desired, talk with your health care provider about methods of family planning or birth control (contraception). °How can I start bonding with my baby? °Spending as much time as  possible with your baby is very important. During this time, you and your baby can get to know each other and develop a bond. Having your baby stay with you in your room (rooming in) can give you time to get to know your baby. Rooming in can also help you become comfortable caring for your baby. Breastfeeding can also help you bond with your baby. °How can I plan for returning home with my baby? °· Make sure that you have a car seat installed in your vehicle. °? Your car seat should be checked by a certified car seat installer to make sure that it is installed safely. °? Make sure that your baby fits into the car seat safely. °· Ask your health care provider any questions you have about caring for yourself or your baby. Make sure that you are able to contact your health care provider with any questions after leaving the hospital. °This information is not intended to replace advice given to you by your health care provider. Make sure you discuss any questions you have with your health care provider. °Document Released: 06/08/2007 Document Revised: 01/14/2016 Document Reviewed: 07/16/2015 °Elsevier Interactive Patient Education © 2018 [employer] ° °

## 2018-07-22 NOTE — Discharge Summary (Addendum)
Obstetrics Discharge Summary OB/GYN Faculty Practice   Patient Name: [name] DOB: 20-Mar-1986 MRN: [id number]  Date of admission: 07/19/2018 Delivering MD: Edd Arbour R   Date of discharge: 07/22/2018  Admitting diagnosis: 39wks oligo  Intrauterine pregnancy: [redacted]w[redacted]d     Secondary diagnosis:   Active Problems:   Supervision of high risk pregnancy, antepartum   Chronic hypertension during pregnancy, antepartum   Anemia in pregnancy   Infant large for gestational age   Oligohydramnios  Additional problems:  . BTL 11/26     Discharge diagnosis: Term Pregnancy Delivered after induction for oligohydramnios                              Postpartum procedures: BTL Complications: none  Outpatient Follow-Up: [ ]  Blood pressure check - started on amlodipine 5mg  daily on PPD#1 for moderate range Bps [ ]  BTL incision check   Hospital course: [name] is a 32 y.o. [redacted]w[redacted]d who was admitted for IOL for oligohydramnios. Her pregnancy was complicated by cHTN, oligohydramnios, BMI 41. Her labor course was notable for cytotec ripening, pitocin titration, epidural placement, and AROM She was also started on labetalol intrapartum for moderate range Bps - CBC, CMP, and UPC wnl. Delivery was uncomplicated. Please see delivery/op note for additional details. Her postpartum course was uncomplicated. She was breastfeeding without difficulty. By day of discharge, she was passing flatus, urinating, eating and drinking without difficulty. Her pain was well-controlled, and she was discharged home with ibuprofen and oxycodone 5mg  #10 for post-op BTL pain. She will follow-up in clinic in 1-2 weeks for BP check and in 4-6 weeks for routine postpartum visit.   Physical exam  Vitals:   07/21/18 0816 07/21/18 1400 07/21/18 2252 07/22/18 0500  BP: (!) 147/94 (!) 142/85 (!) 145/96 138/89  Pulse: 97 93 90 70  Resp: 19  18 16   Temp: 98 F (36.7 C) 98.5 F (36.9 C) 98.4 F (36.9 C) 98.2 F (36.8 C)   TempSrc: Oral Oral Oral Oral  SpO2: 99% 97%    Weight:      Height:       General: well-appearing, NAD Lochia: appropriate Uterine Fundus: firm Incision: infraumbilical dressing in place - dried blood on honeycomb, appropriately tender DVT Evaluation: No significant calf/ankle edema. Labs: Lab Results  Component Value Date   WBC 6.5 07/19/2018   HGB 11.4 (L) 07/19/2018   HCT 34.2 (L) 07/19/2018   MCV 90.7 07/19/2018   PLT 261 07/19/2018   CMP Latest Ref Rng & Units 07/19/2018  Glucose 70 - 99 mg/dL 82(N)  BUN 6 - 20 mg/dL <5(A)  Creatinine 2.13 - 1.00 mg/dL 0.86(V)  Sodium 784 - 696 mmol/L 137  Potassium 3.5 - 5.1 mmol/L 3.7  Chloride 98 - 111 mmol/L 107  CO2 22 - 32 mmol/L 21(L)  Calcium 8.9 - 10.3 mg/dL 2.9(B)  Total Protein 6.5 - 8.1 g/dL 6.3(L)  Total Bilirubin 0.3 - 1.2 mg/dL 1.0  Alkaline Phos 38 - 126 U/L 131(H)  AST 15 - 41 U/L 18  ALT 0 - 44 U/L 13    Discharge instructions: Per After Visit Summary and "Baby and Me Booklet"  After visit meds:  Allergies as of 07/22/2018   No Known Allergies     Medication List    STOP taking these medications   promethazine 25 MG tablet Commonly known as:  PHENERGAN     TAKE these medications   amLODipine 5  MG tablet Commonly known as:  NORVASC Take 1 tablet (5 mg total) by mouth daily.   aspirin 81 MG tablet Take 1 tablet (81 mg total) by mouth daily.   ferrous gluconate 324 MG tablet Commonly known as:  FERGON Take 1 tablet (324 mg total) by mouth 2 (two) times daily with a meal.   ibuprofen 600 MG tablet Commonly known as:  ADVIL,MOTRIN Take 1 tablet (600 mg total) by mouth every 6 (six) hours.   oxyCODONE 5 MG immediate release tablet Commonly known as:  Oxy IR/ROXICODONE Take 1 tablet (5 mg total) by mouth every 6 (six) hours as needed (pain scale 4-7).   PRENATAL 19 tablet Take 1 tablet by mouth daily.       Postpartum contraception: Tubal Ligation done postpartum  Diet: Routine  Diet Activity: Advance as tolerated. Pelvic rest for 6 weeks.   Follow-up Appt:No future appointments. Follow-up Visit:No follow-ups on file.  Newborn Data: Live born female  Birth Weight: 8 lb 9.2 oz (3890 g) APGAR: 9, 9  Newborn Delivery   Birth date/time:  07/20/2018 10:02:00 Delivery type:  Vaginal, Spontaneous    Baby Feeding: Both Disposition:home with mother  Leeroy BockAnderson, Chelsey L, DO Resident, PGY-1  OB FELLOW DISCHARGE ATTESTATION  I have seen and examined this patient and agree with above documentation in the resident's note.   Gwenevere AbbotNimeka Virdia Ziesmer, MD OB Fellow  07/22/2018, 9:20 AM

## 2018-07-27 ENCOUNTER — Other Ambulatory Visit: Payer: Self-pay | Admitting: Obstetrics & Gynecology

## 2018-08-31 ENCOUNTER — Encounter: Payer: Self-pay | Admitting: Student

## 2018-08-31 ENCOUNTER — Ambulatory Visit (INDEPENDENT_AMBULATORY_CARE_PROVIDER_SITE_OTHER): Payer: Medicaid Other | Admitting: Student

## 2018-08-31 DIAGNOSIS — Z1389 Encounter for screening for other disorder: Secondary | ICD-10-CM | POA: Diagnosis not present

## 2018-08-31 DIAGNOSIS — I10 Essential (primary) hypertension: Secondary | ICD-10-CM

## 2018-08-31 MED ORDER — AMLODIPINE BESYLATE 10 MG PO TABS: 10.0000 mg | ORAL_TABLET | Freq: Every day | ORAL | 2 refills | Status: DC

## 2018-08-31 NOTE — Progress Notes (Signed)
Subjective:     [name] is a 33 y.o. female who presents for a postpartum visit. She is 6 weeks postpartum following a spontaneous vaginal delivery. I have fully reviewed the prenatal and intrapartum course. The delivery was at 39/2 gestational weeks. Outcome: spontaneous vaginal delivery. Anesthesia: epidural. Postpartum course has been normal. Baby's course has been normal. Baby is feeding by both breast and bottle - Rush Barer Good start. Bleeding no bleeding. Bowel function is normal. Bladder function is normal. Patient is not sexually active. Contraception method is tubal ligation. Postpartum depression screening: negative.  The following portions of the patient's history were reviewed and updated as appropriate: allergies, current medications, past family history, past medical history, past social history, past surgical history and problem list.  Review of Systems Pertinent items are noted in HPI.   Objective:    BP 140/83   Pulse 79   Ht 5\' 2"  (1.575 m)   Wt 196 lb 4.8 oz (89 kg)   Breastfeeding No Comment: Both  BMI 35.90 kg/m   General:  alert, cooperative and appears stated age  Lungs: clear to auscultation bilaterally  Heart:  regular rate and rhythm, S1, S2 normal, no murmur, click, rub or gallop  Abdomen: soft, non-tender; bowel sounds normal; no masses,  no organomegaly and umbilical incision well healed.    Vulva:  not evaluated  Vagina: not evaluated        Assessment:   Normal postpartum exam. Pap smear up to date. Pt with chronic hypertension. States she previously used Norvasc but a higher dose than she was discharged home with. BP stable. Will refill meds at higher dose with 2 refills until she can establish with a PCP.   Plan:   1. Encounter for routine postpartum follow-up -doing well. S/p BTL. Incision looks good -pap smear up to date  2. Essential hypertension -increased dose to 10 mg /day. Pt to f/u with PCP - amLODipine (NORVASC) 10 MG tablet; Take 1  tablet (10 mg total) by mouth daily.  Dispense: 30 tablet; Refill: 2   Judeth Horn, NP

## 2018-08-31 NOTE — Patient Instructions (Signed)
Managing Your Hypertension  Hypertension is commonly called high blood pressure. This is when the force of your blood pressing against the walls of your arteries is too strong. Arteries are blood vessels that carry blood from your heart throughout your body. Hypertension forces the heart to work harder to pump blood, and may cause the arteries to become narrow or stiff. Having untreated or uncontrolled hypertension can cause heart attack, stroke, kidney disease, and other problems.  What are blood pressure readings?  A blood pressure reading consists of a higher number over a lower number. Ideally, your blood pressure should be below 120/80. The first ("top") number is called the systolic pressure. It is a measure of the pressure in your arteries as your heart beats. The second ("bottom") number is called the diastolic pressure. It is a measure of the pressure in your arteries as the heart relaxes.  What does my blood pressure reading mean?  Blood pressure is classified into four stages. Based on your blood pressure reading, your health care provider may use the following stages to determine what type of treatment you need, if any. Systolic pressure and diastolic pressure are measured in a unit called mm Hg.  Normal   Systolic pressure: below 120.   Diastolic pressure: below 80.  Elevated   Systolic pressure: 120-129.   Diastolic pressure: below 80.  Hypertension stage 1   Systolic pressure: 130-139.   Diastolic pressure: 80-89.  Hypertension stage 2   Systolic pressure: 140 or above.   Diastolic pressure: 90 or above.  What health risks are associated with hypertension?  Managing your hypertension is an important responsibility. Uncontrolled hypertension can lead to:   A heart attack.   A stroke.   A weakened blood vessel (aneurysm).   Heart failure.   Kidney damage.   Eye damage.   Metabolic syndrome.   Memory and concentration problems.  What changes can I make to manage my  hypertension?  Hypertension can be managed by making lifestyle changes and possibly by taking medicines. Your health care provider will help you make a plan to bring your blood pressure within a normal range.  Eating and drinking     Eat a diet that is high in fiber and potassium, and low in salt (sodium), added sugar, and fat. An example eating plan is called the DASH (Dietary Approaches to Stop Hypertension) diet. To eat this way:  ? Eat plenty of fresh fruits and vegetables. Try to fill half of your plate at each meal with fruits and vegetables.  ? Eat whole grains, such as whole wheat pasta, brown rice, or whole grain bread. Fill about one quarter of your plate with whole grains.  ? Eat low-fat diary products.  ? Avoid fatty cuts of meat, processed or cured meats, and poultry with skin. Fill about one quarter of your plate with lean proteins such as fish, chicken without skin, beans, eggs, and tofu.  ? Avoid premade and processed foods. These tend to be higher in sodium, added sugar, and fat.   Reduce your daily sodium intake. Most people with hypertension should eat less than 1,500 mg of sodium a day.   Limit alcohol intake to no more than 1 drink a day for nonpregnant women and 2 drinks a day for men. One drink equals 12 oz of beer, 5 oz of wine, or 1 oz of hard liquor.  Lifestyle   Work with your health care provider to maintain a healthy body weight, or to lose   weight. Ask what an ideal weight is for you.   Get at least 30 minutes of exercise that causes your heart to beat faster (aerobic exercise) most days of the week. Activities may include walking, swimming, or biking.   Include exercise to strengthen your muscles (resistance exercise), such as weight lifting, as part of your weekly exercise routine. Try to do these types of exercises for 30 minutes at least 3 days a week.   Do not use any products that contain nicotine or tobacco, such as cigarettes and e-cigarettes. If you need help quitting,  ask your health care provider.   Control any long-term (chronic) conditions you have, such as high cholesterol or diabetes.  Monitoring   Monitor your blood pressure at home as told by your health care provider. Your personal target blood pressure may vary depending on your medical conditions, your age, and other factors.   Have your blood pressure checked regularly, as often as told by your health care provider.  Working with your health care provider   Review all the medicines you take with your health care provider because there may be side effects or interactions.   Talk with your health care provider about your diet, exercise habits, and other lifestyle factors that may be contributing to hypertension.   Visit your health care provider regularly. Your health care provider can help you create and adjust your plan for managing hypertension.  Will I need medicine to control my blood pressure?  Your health care provider may prescribe medicine if lifestyle changes are not enough to get your blood pressure under control, and if:   Your systolic blood pressure is 130 or higher.   Your diastolic blood pressure is 80 or higher.  Take medicines only as told by your health care provider. Follow the directions carefully. Blood pressure medicines must be taken as prescribed. The medicine does not work as well when you skip doses. Skipping doses also puts you at risk for problems.  Contact a health care provider if:   You think you are having a reaction to medicines you have taken.   You have repeated (recurrent) headaches.   You feel dizzy.   You have swelling in your ankles.   You have trouble with your vision.  Get help right away if:   You develop a severe headache or confusion.   You have unusual weakness or numbness, or you feel faint.   You have severe pain in your chest or abdomen.   You vomit repeatedly.   You have trouble breathing.  Summary   Hypertension is when the force of blood pumping  through your arteries is too strong. If this condition is not controlled, it may put you at risk for serious complications.   Your personal target blood pressure may vary depending on your medical conditions, your age, and other factors. For most people, a normal blood pressure is less than 120/80.   Hypertension is managed by lifestyle changes, medicines, or both. Lifestyle changes include weight loss, eating a healthy, low-sodium diet, exercising more, and limiting alcohol.  This information is not intended to replace advice given to you by your health care provider. Make sure you discuss any questions you have with your health care provider.  Document Released: 05/05/2012 Document Revised: 07/09/2016 Document Reviewed: 07/09/2016  Elsevier Interactive Patient Education  2019 [employer]

## 2018-11-12 ENCOUNTER — Other Ambulatory Visit: Payer: Self-pay | Admitting: Student

## 2018-11-12 DIAGNOSIS — I10 Essential (primary) hypertension: Secondary | ICD-10-CM

## 2018-11-15 NOTE — Telephone Encounter (Signed)
Will refill for now. Patient should follow up with primary care for future refills.

## 2019-05-31 ENCOUNTER — Other Ambulatory Visit: Payer: Self-pay

## 2019-05-31 DIAGNOSIS — Z20822 Contact with and (suspected) exposure to covid-19: Secondary | ICD-10-CM

## 2019-06-02 LAB — NOVEL CORONAVIRUS, NAA: SARS-CoV-2, NAA: NOT DETECTED

## 2019-06-05 IMAGING — US US MFM FETAL BPP W/O NON-STRESS
1 series · 12 of 15 positions shown · non-contrast
Comparison: none

[Series 1: us mfm fetal bpp w/o non-stress · 15 acquisitions, 12 frames shown]
[im 1/15]
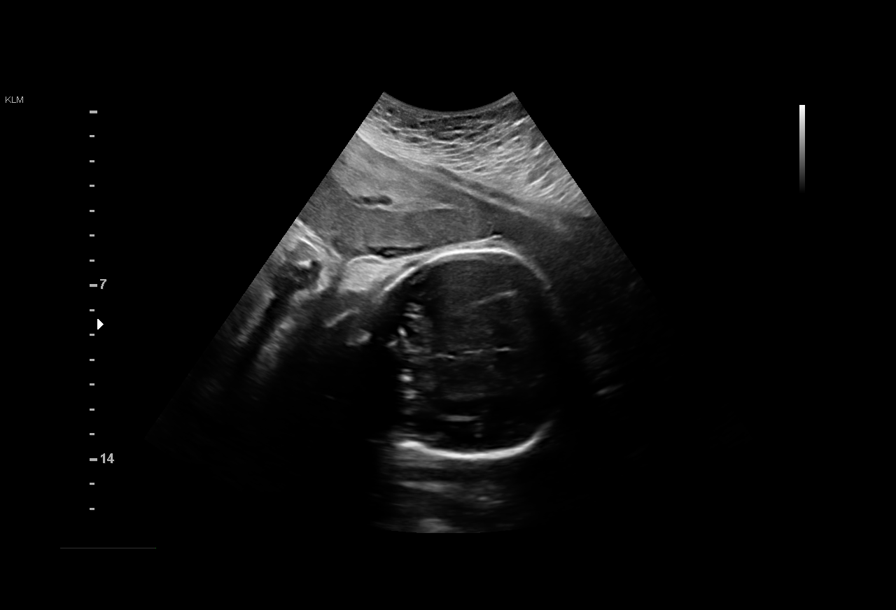
[im 2/15]
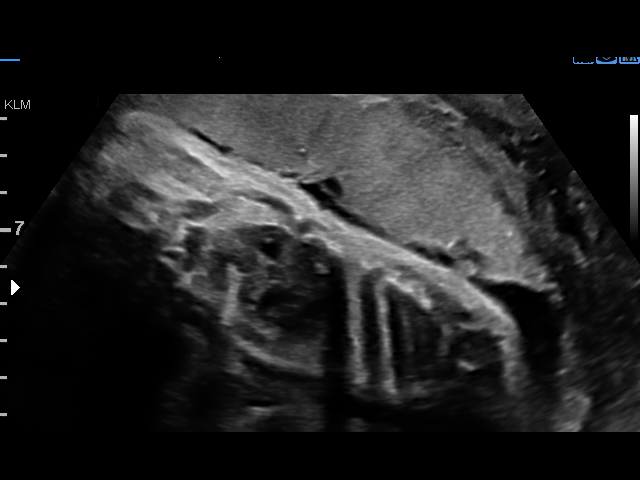
[im 4/15]
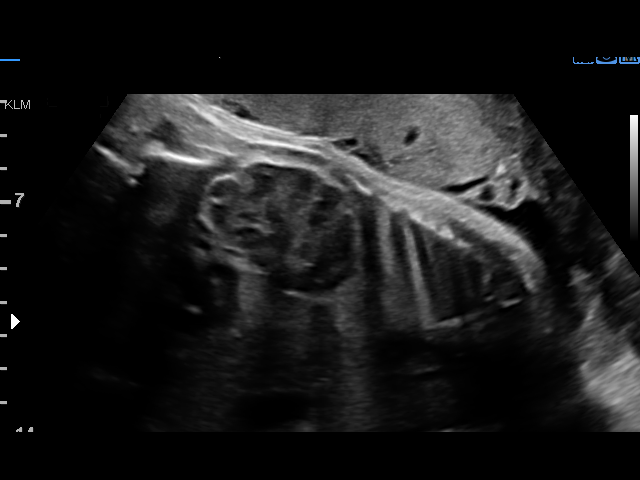
[im 5/15]
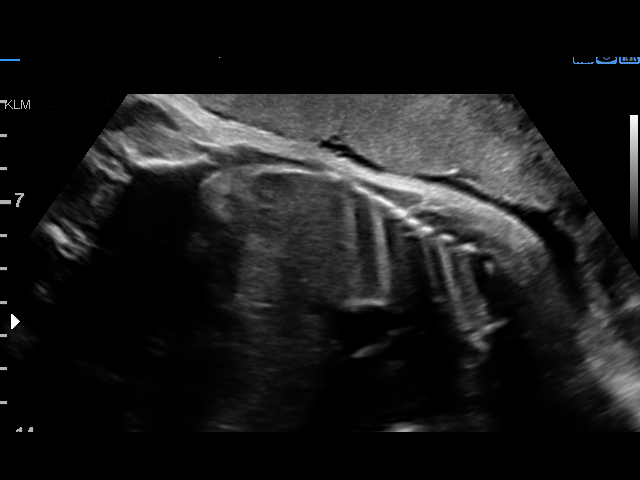
[im 6/15]
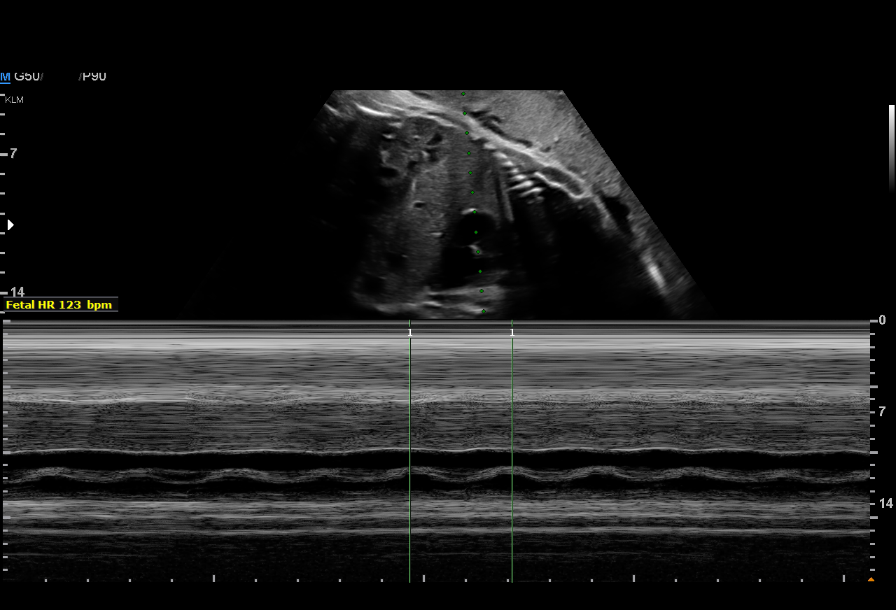
[im 7/15]
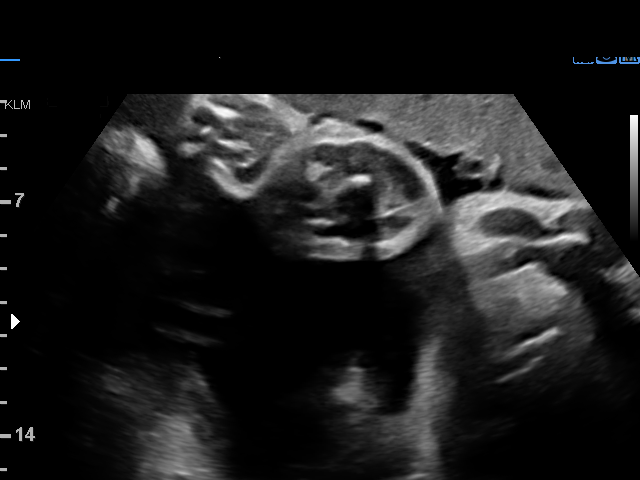
[im 9/15]
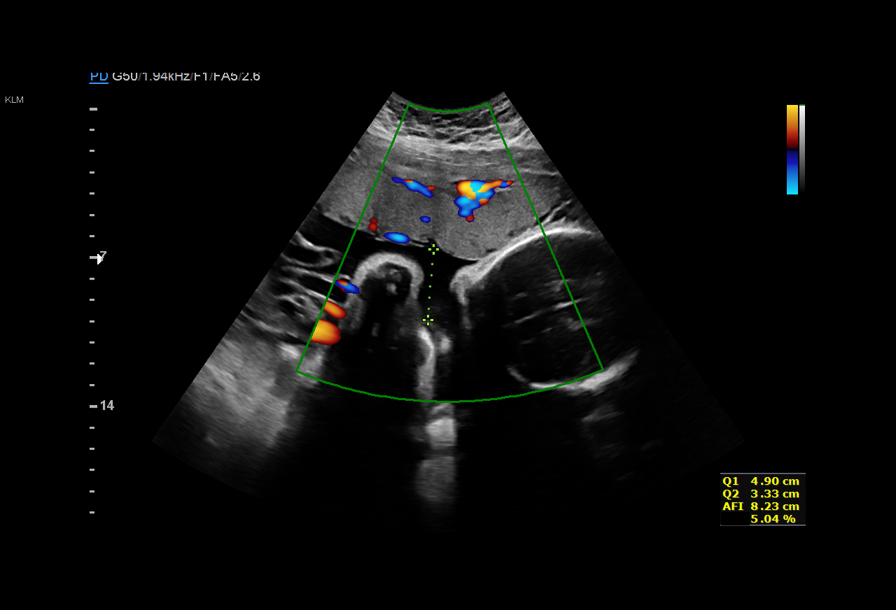
[im 10/15]
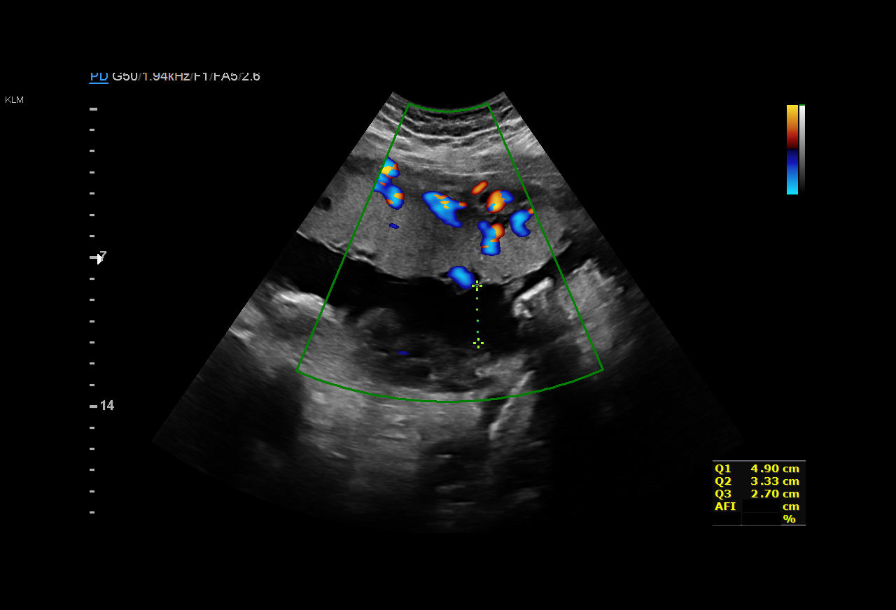
[im 11/15]
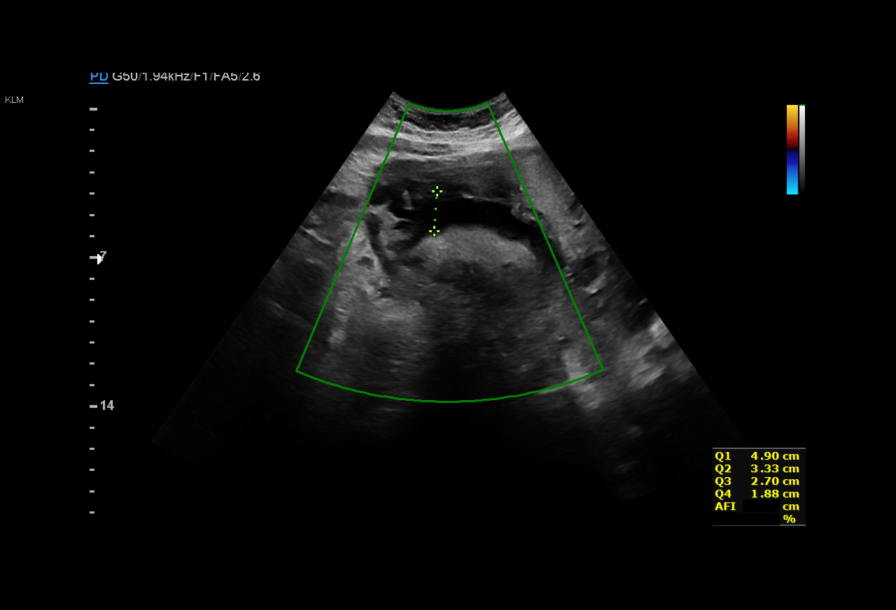
[im 12/15]
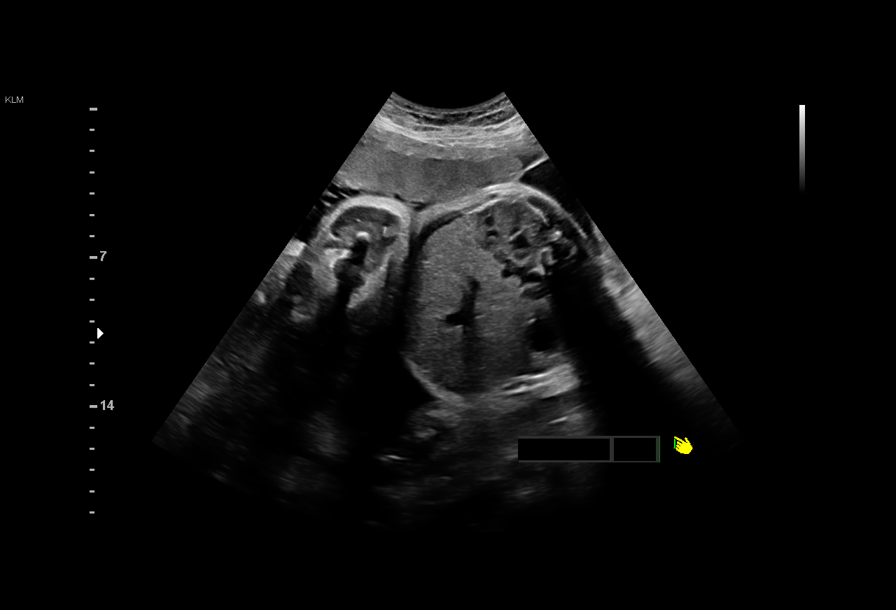
[im 14/15]
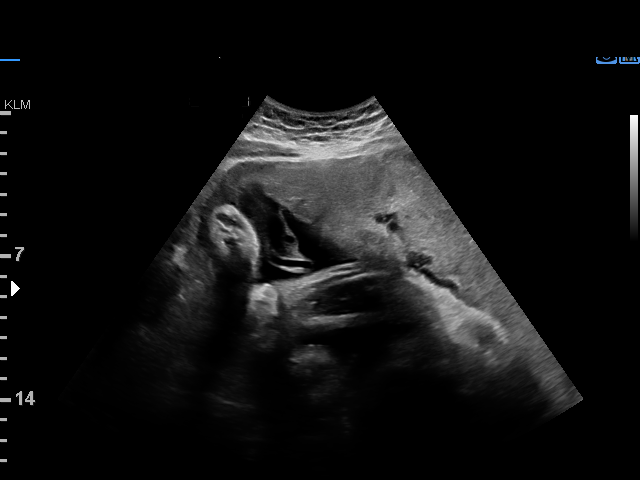
[im 15/15]
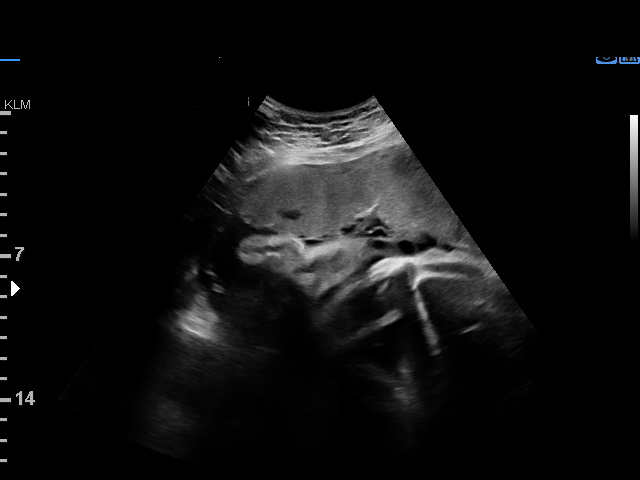

[12 of 15 positions shown; findings below may reference images not displayed]

Indications

Hypertension - Chronic/Pre-existing (ASA)
33 weeks gestation of pregnancy
Fetal Evaluation

Num Of Fetuses:         1
Fetal Heart Rate(bpm):  123
Cardiac Activity:       Observed
Presentation:           Cephalic

Amniotic Fluid
AFI FV:      Within normal limits

AFI Sum(cm)     %Tile       Largest Pocket(cm)
12.81           39

RUQ(cm)       RLQ(cm)       LUQ(cm)        LLQ(cm)
4.9

Comment:    [DATE] BPP in 5 minutes.
Biophysical Evaluation

Amniotic F.V:   Within normal limits       F. Tone:        Observed
F. Movement:    Observed                   Score:          [DATE]
F. Breathing:   Observed
OB History

Gravidity:    4         Term:   3        Prem:   0        SAB:   0
TOP:          0       Ectopic:  0        Living: 3
Gestational Age

LMP:           34w 1d        Date:  10/13/17                 EDD:   07/20/18
Best:          33w 3d     Det. By:  Early Ultrasound         EDD:   07/25/18
(12/04/17)
Anatomy

Stomach:               Appears normal, left   Bladder:                Appears normal
sided
Impression

Amniotic fluid is normal and good fetal activity is seen.
Antenatal testing is reassuring. BPP [DATE].
Recommendations

-Continue weekly antenatal testing till delivery (as
recommended earlier).

## 2019-06-19 IMAGING — US US MFM FETAL BPP W/O NON-STRESS
1 series · 14 of 28 positions shown · non-contrast
Comparison: none

[Series 1: us mfm fetal bpp w/o non-stress · 30 acquisitions, 14 frames shown]
[im 2/30]
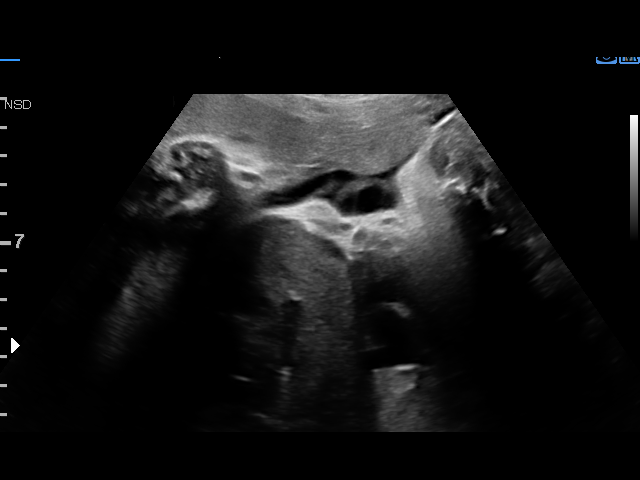
[im 4/30]
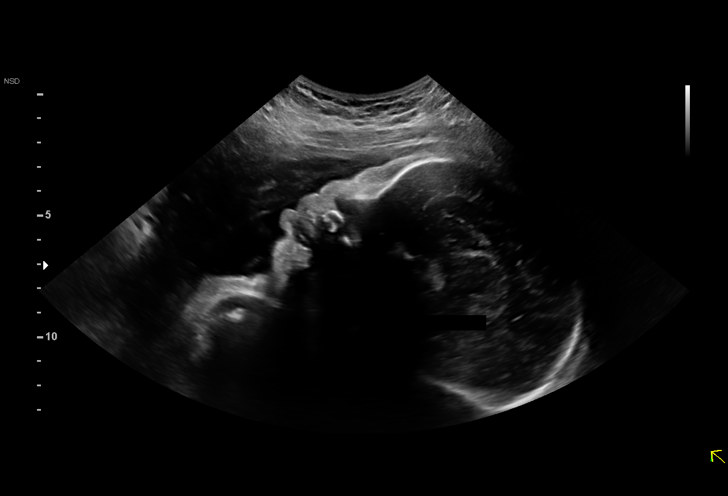
[im 6/30]
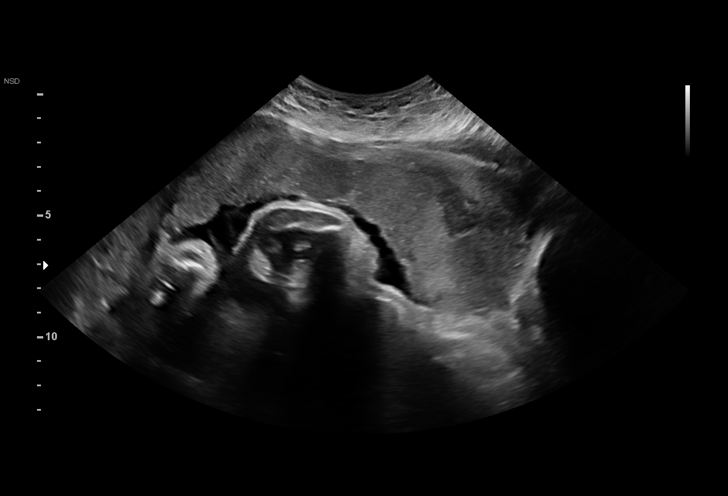
[im 8/30]
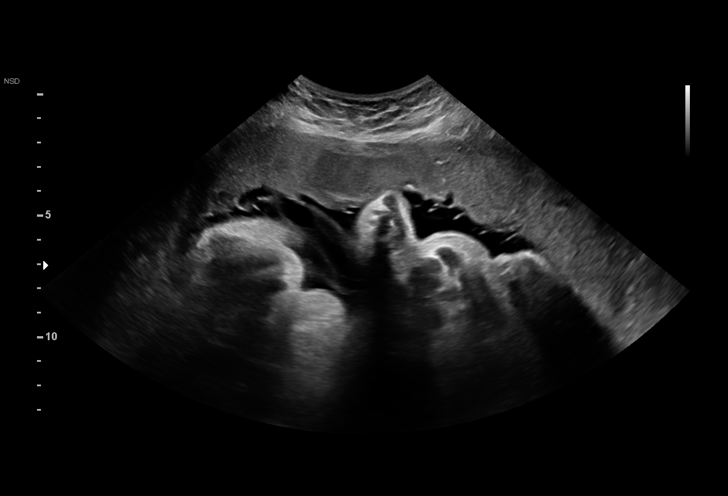
[im 10/30]
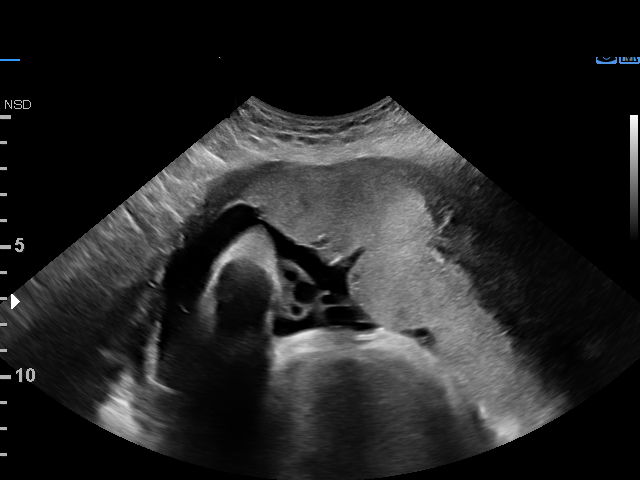
[im 12/30]
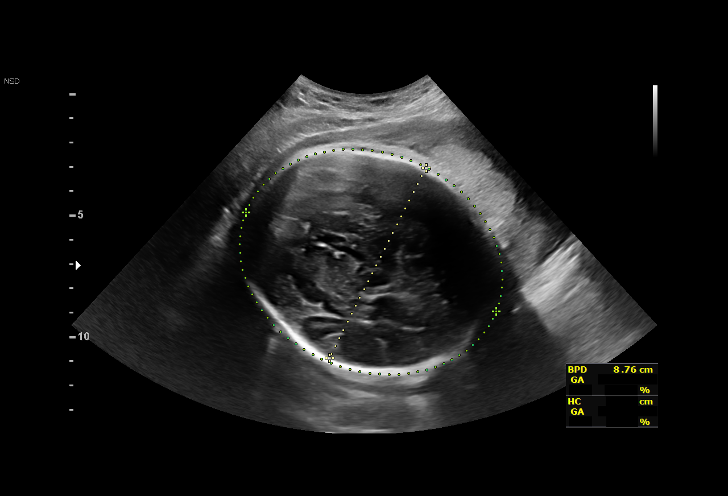
[im 14/30]
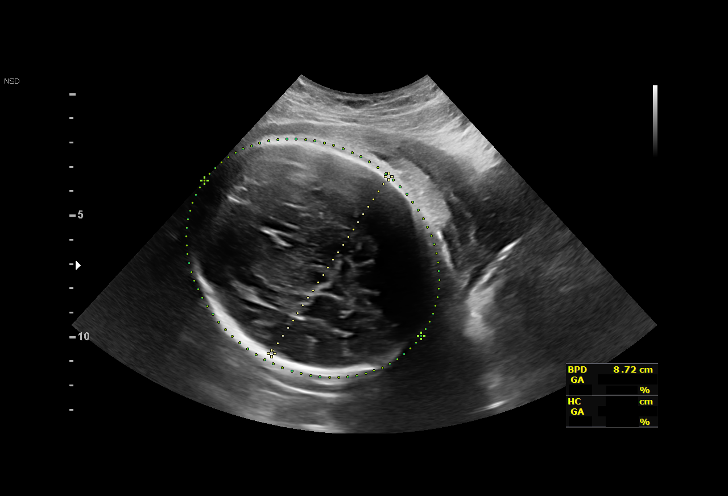
[im 17/30]
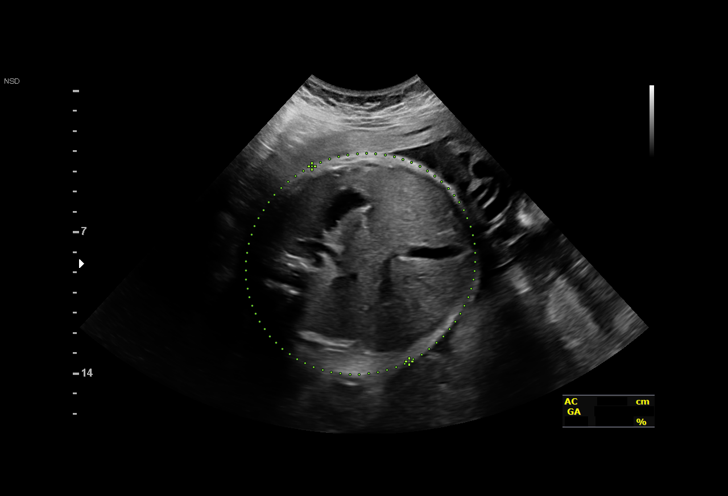
[im 19/30]
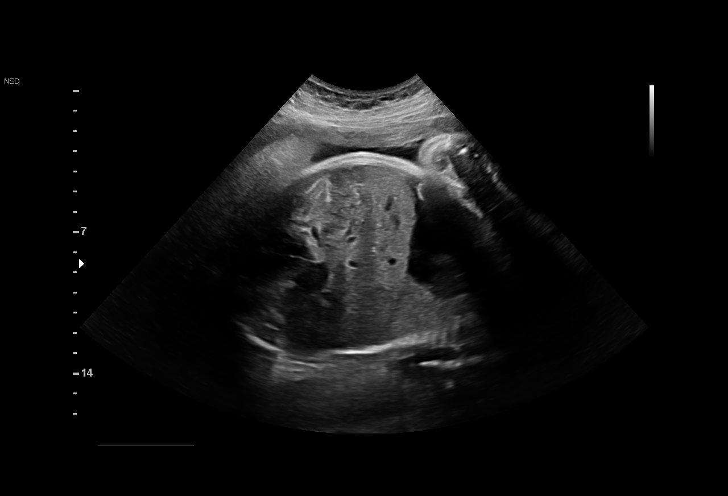
[im 21/30]
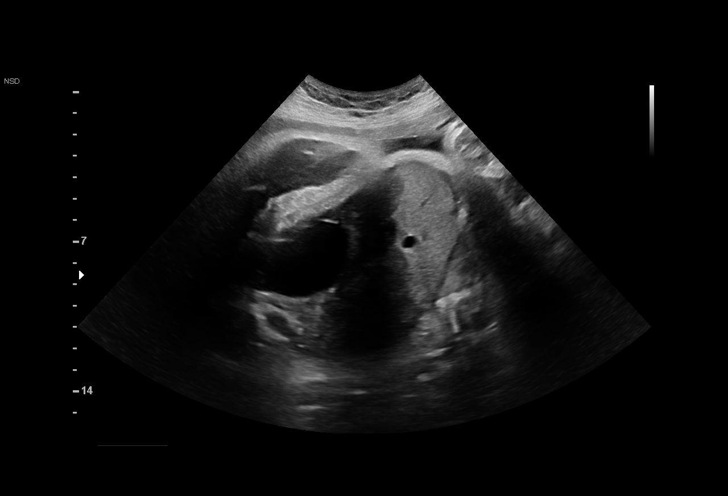
[im 23/30]
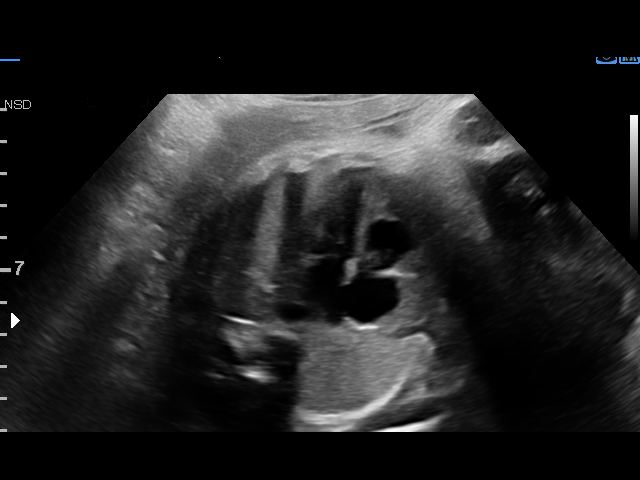
[im 25/30]
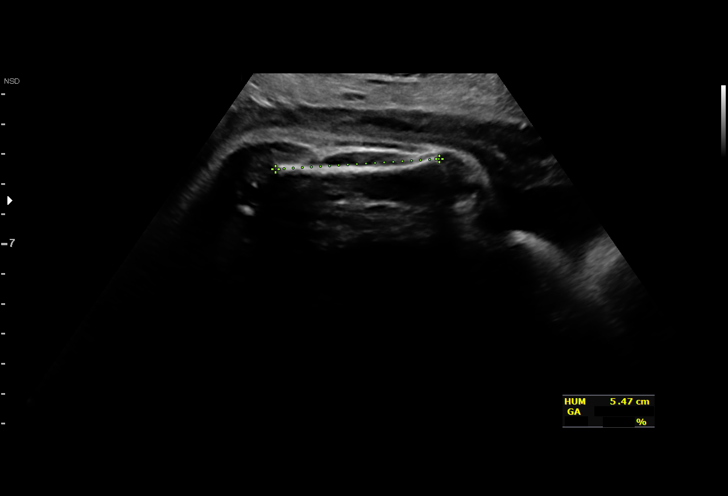
[im 27/30]
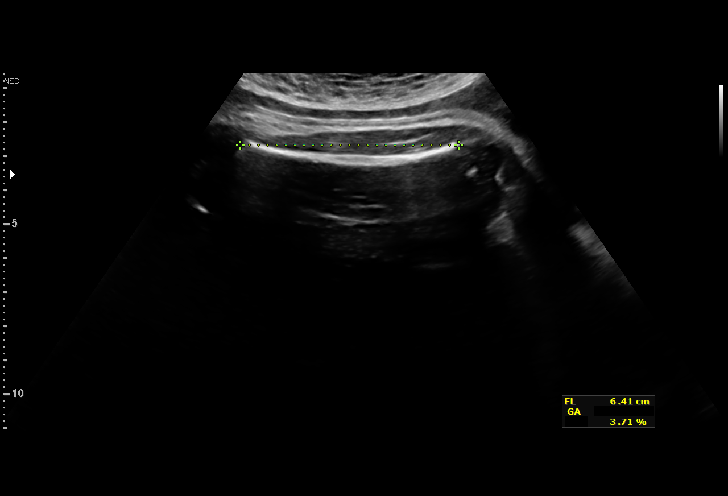
[im 30/30]
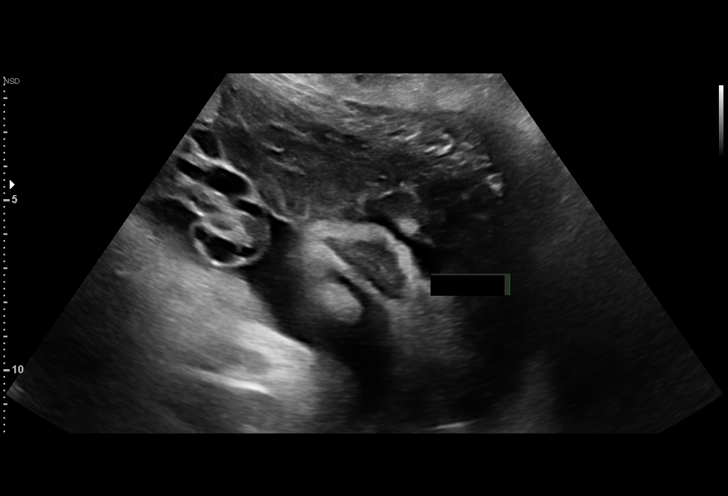

[14 of 28 positions shown; findings below may reference images not displayed]

[facility]

[facility]

Indications

Hypertension - Chronic/Pre-existing (ASA)
35 weeks gestation of pregnancy
Obesity complicating pregnancy, third
trimester
Vital Signs

Height:        5'2"
Fetal Evaluation

Num Of Fetuses:         1
Fetal Heart Rate(bpm):  146
Cardiac Activity:       Observed
Presentation:           Cephalic
Placenta:               Anterior
P. Cord Insertion:      Previously Visualized
Amniotic Fluid
AFI FV:      Within normal limits

AFI Sum(cm)     %Tile       Largest Pocket(cm)
10.18           22

RUQ(cm)       RLQ(cm)       LUQ(cm)        LLQ(cm)
2.41
Biophysical Evaluation

Amniotic F.V:   Pocket => 2 cm two         F. Tone:        Observed
planes
F. Movement:    Observed                   Score:          [DATE]
F. Breathing:   Observed
Biometry

BPD:      87.4  mm     G. Age:  35w 2d         51  %    CI:        76.67   %    70 - 86
FL/HC:      20.2   %    20.1 -
HC:      316.2  mm     G. Age:  35w 4d         21  %    HC/AC:      0.92        0.93 -
AC:      343.8  mm     G. Age:  38w 2d       > 97  %    FL/BPD:     73.2   %    71 - 87
FL:         64  mm     G. Age:  33w 1d          4  %    FL/AC:      18.6   %    20 - 24
HUM:      54.7  mm     G. Age:  31w 6d        < 5  %
Est. FW:    7988  gm      6 lb 7 oz     80  %
OB History

Gravidity:    4         Term:   3        Prem:   0        SAB:   0
TOP:          0       Ectopic:  0        Living: 3
Gestational Age

LMP:           36w 1d        Date:  10/13/17                 EDD:   07/20/18
U/S Today:     35w 4d                                        EDD:   07/24/18
Best:          35w 3d     Det. By:  Early Ultrasound         EDD:   07/25/18
(12/04/17)
Anatomy

Cranium:               Appears normal         Aortic Arch:            Previously seen
Cavum:                 Previously seen        Ductal Arch:            Previously seen
Ventricles:            Previously seen        Diaphragm:              Appears normal
Choroid Plexus:        Previously seen        Stomach:                Appears normal, left
sided
Cerebellum:            Previously seen        Abdomen:                Previously seen
Posterior Fossa:       Previously seen        Abdominal Wall:         Previously seen
Face:                  Orbits and profile     Cord Vessels:           Previously seen
previously seen
Lips:                  Previously seen        Kidneys:                Previously seen
Thoracic:              Appears normal         Bladder:                Appears normal
Heart:                 Appears normal         Spine:                  Previously seen
(4CH, axis, and situs
RVOT:                  Previously seen        Upper Extremities:      Previously seen
LVOT:                  Previously seen        Lower Extremities:      Previously seen

Other:  Heels and Right 5th digit previously visualized. Nasal bone previously
visualized.
Cervix Uterus Adnexa

Cervix
Normal appearance by transabdominal scan.

Left Ovary
Previously seen.

Right Ovary
Previously seen

Adnexa
No abnormality visualized.
Impression

Chronic hypertension. Not taking antihypertensives.
Amniotic fluid is normal and good fetal activity is seen. Fetal
growth is appropriate for gestational age. Antenatal testing is
reassuring. BPP [DATE]. Patient does not have headache or
epigastric pain. Has occasional visual spots.

BP at our office: 153/88 and 144/84 mm Hg.
Recommendations

Continue weekly antenatal testing till delivery.

## 2019-07-03 IMAGING — US US MFM FETAL BPP W/O NON-STRESS
1 series · 15 of 19 positions shown · non-contrast
Comparison: none

[Series 1: us mfm fetal bpp w/o non-stress · 19 acquisitions, 15 frames shown]
[im 1/19]
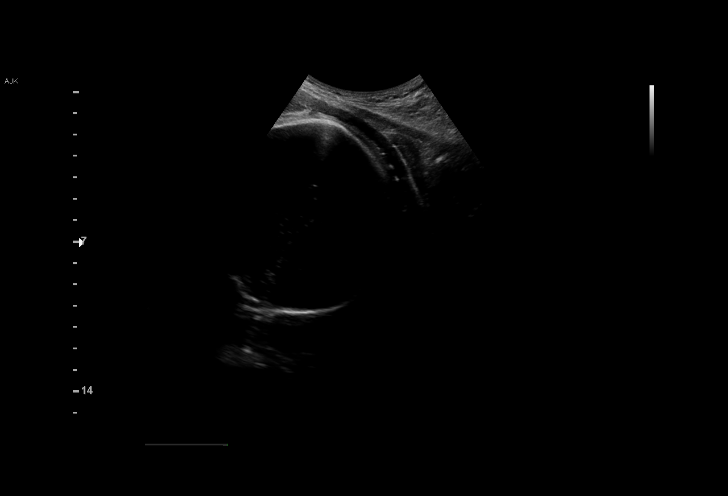
[im 2/19]
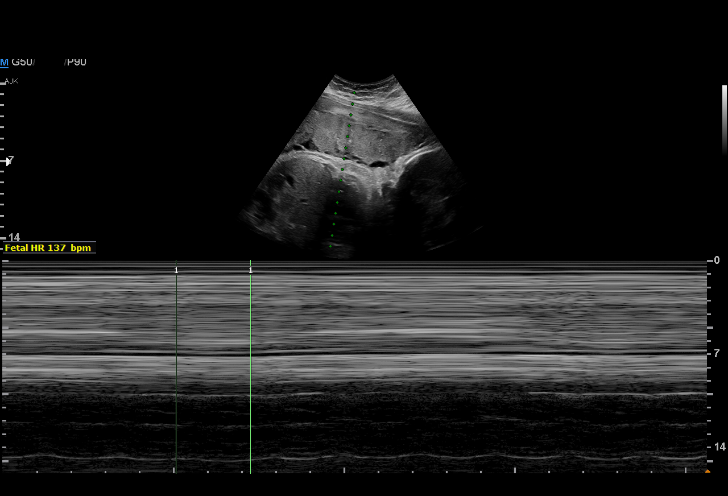
[im 4/19]
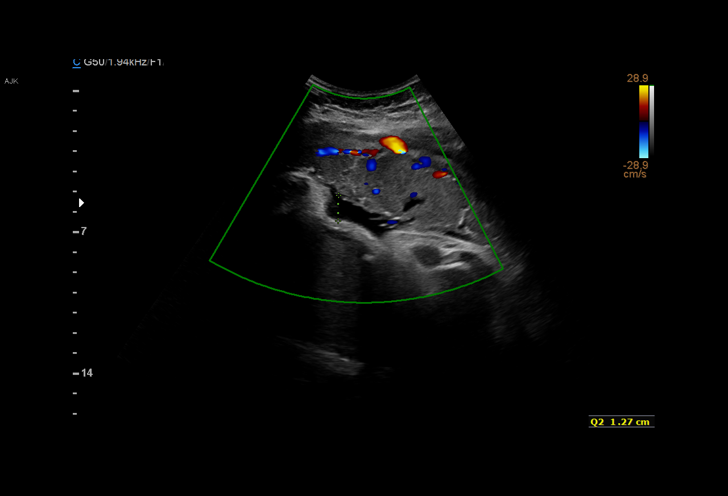
[im 5/19]
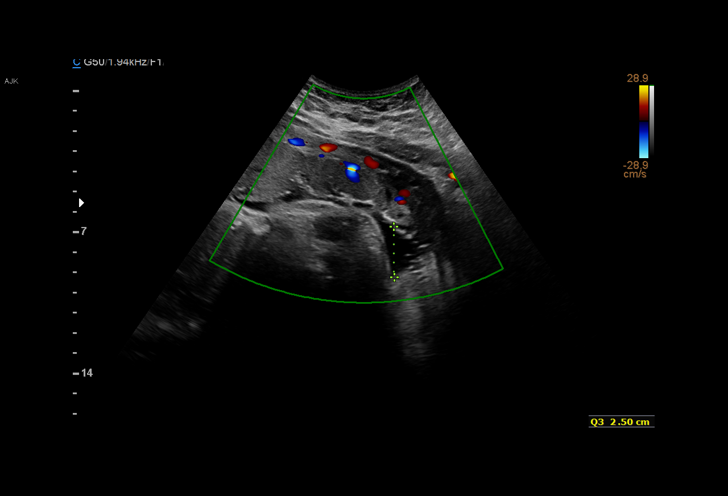
[im 6/19]
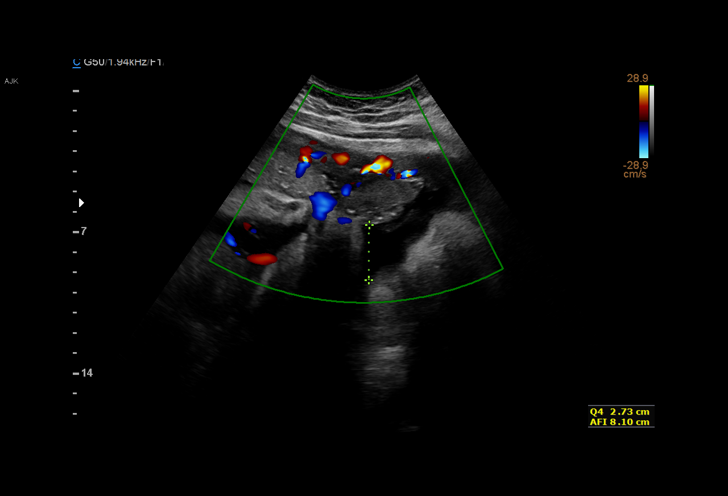
[im 7/19]
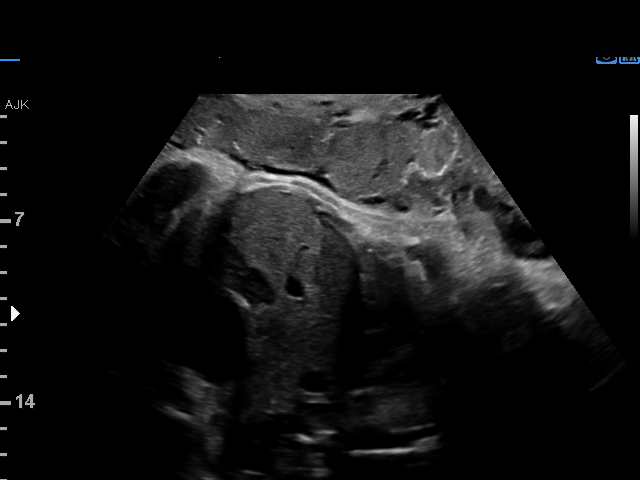
[im 9/19]
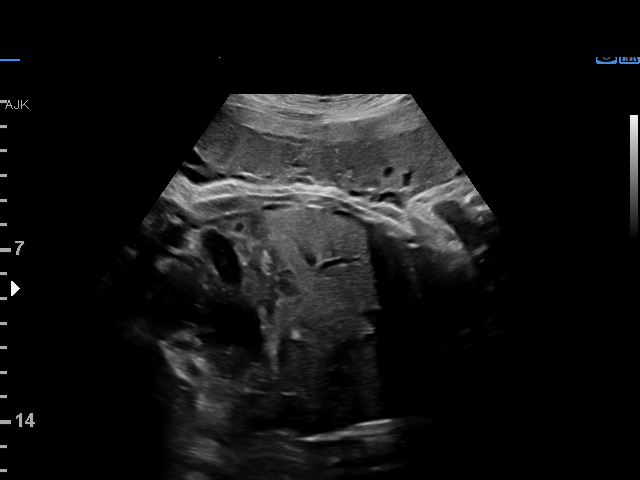
[im 10/19]
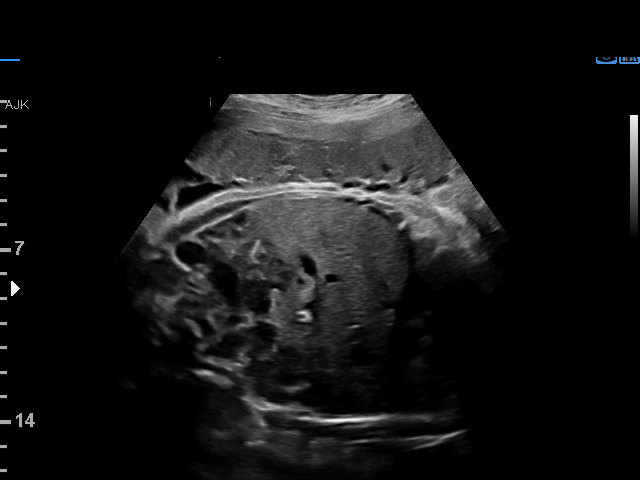
[im 11/19]
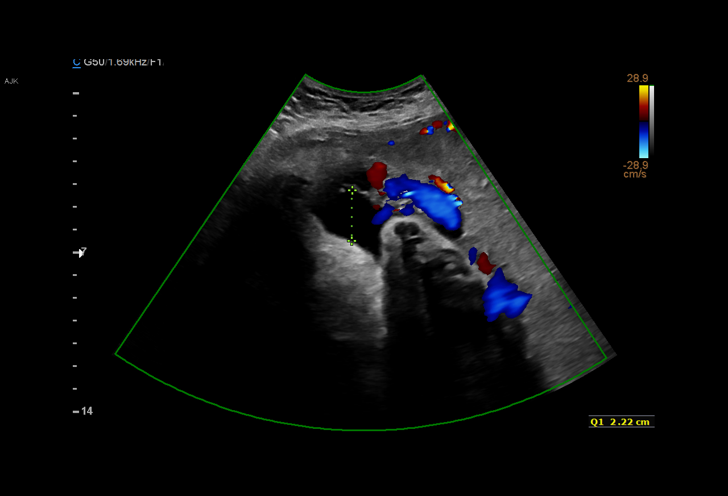
[im 13/19]
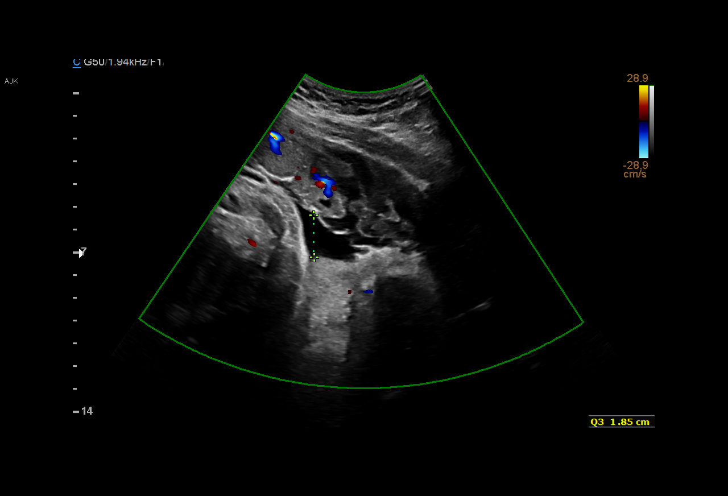
[im 14/19]
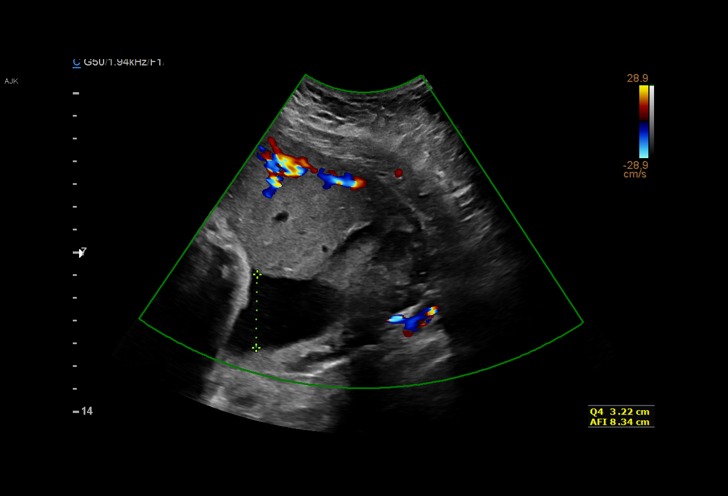
[im 15/19]
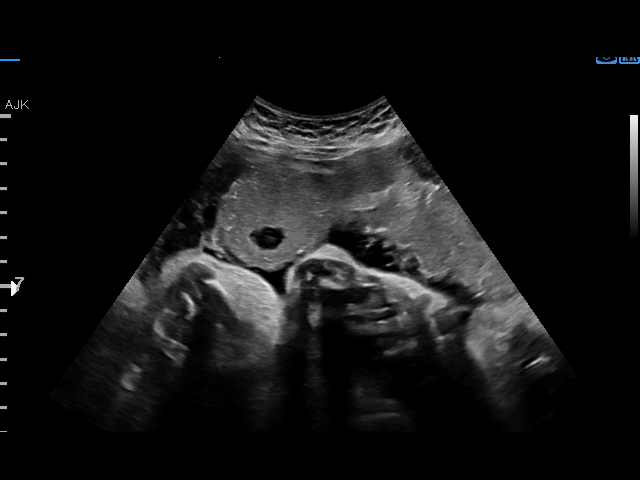
[im 16/19]
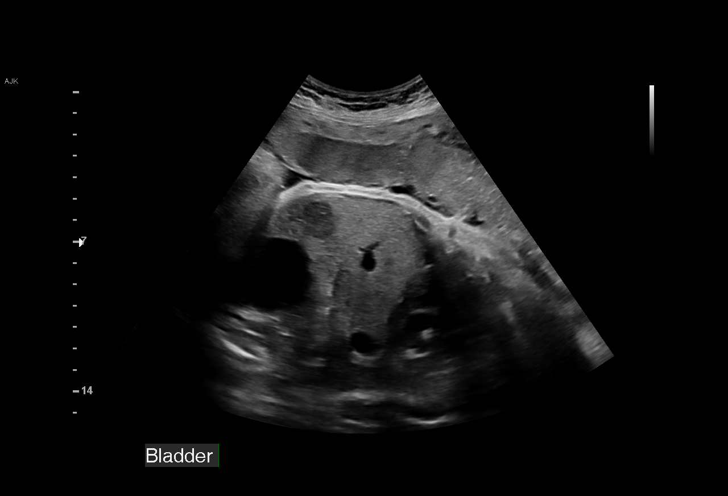
[im 18/19]
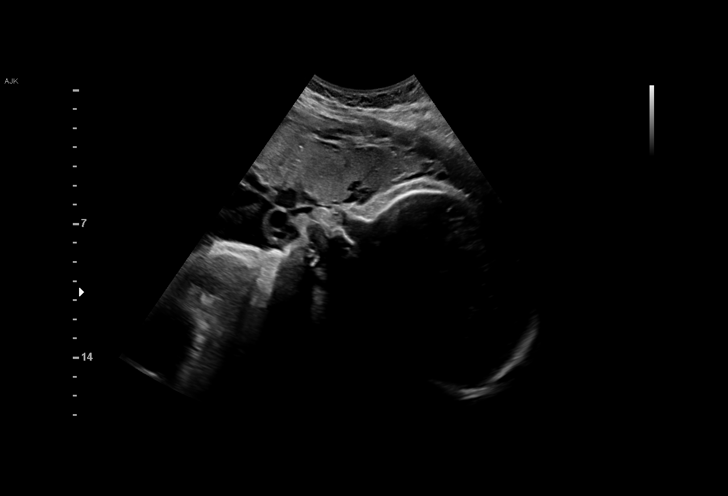
[im 19/19]
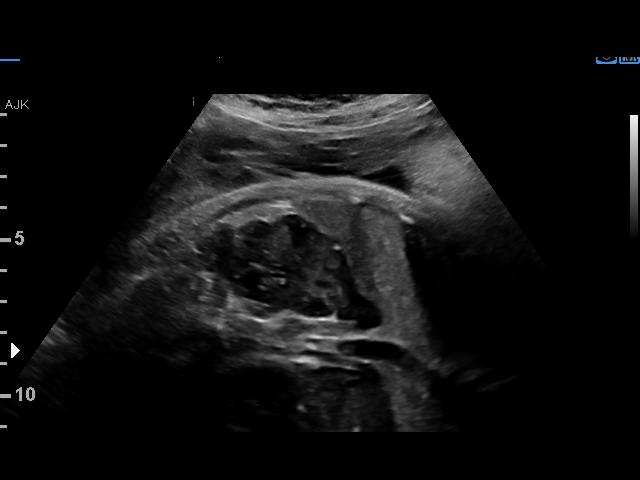

[15 of 19 positions shown; findings below may reference images not displayed]

[facility]
                   [id number]

 ----------------------------------------------------------------------

 ----------------------------------------------------------------------
Indications

  37 weeks gestation of pregnancy
  Hypertension - Chronic/Pre-existing (ASA)
  Obesity complicating pregnancy, third
  trimester
 ----------------------------------------------------------------------
Vital Signs

                                                Height:        5'2"
Fetal Evaluation

 Num Of Fetuses:          1
 Fetal Heart Rate(bpm):   137
 Cardiac Activity:        Observed
 Presentation:            Cephalic

 Amniotic Fluid
 AFI FV:      Subjectively low-normal

 AFI Sum(cm)     %Tile       Largest Pocket(cm)
 8.34            12
 RUQ(cm)       RLQ(cm)       LUQ(cm)        LLQ(cm)

Biophysical Evaluation

 Amniotic F.V:   Pocket => 2 cm two         F. Tone:         Observed
                 planes
 F. Movement:    Observed                   Score:           [DATE]
 F. Breathing:   Observed
OB History

 Gravidity:    4         Term:   3        Prem:   0        SAB:   0
 TOP:          0       Ectopic:  0        Living: 3
Gestational Age

 LMP:           38w 1d        Date:  10/13/17                 EDD:   07/20/18
 Best:          37w 3d     Det. By:  Early Ultrasound         EDD:   07/25/18
                                     (12/04/17)
Impression

 Biophysical profile [DATE]
Recommendations

 Follow up BPP is scheduled in 1 weeks.

## 2019-07-10 IMAGING — US US MFM FETAL BPP W/O NON-STRESS
1 series · 12 of 28 positions shown · non-contrast
Comparison: none

[Series 1: us mfm fetal bpp w/o non-stress · 28 acquisitions, 12 frames shown]
[im 2/28]
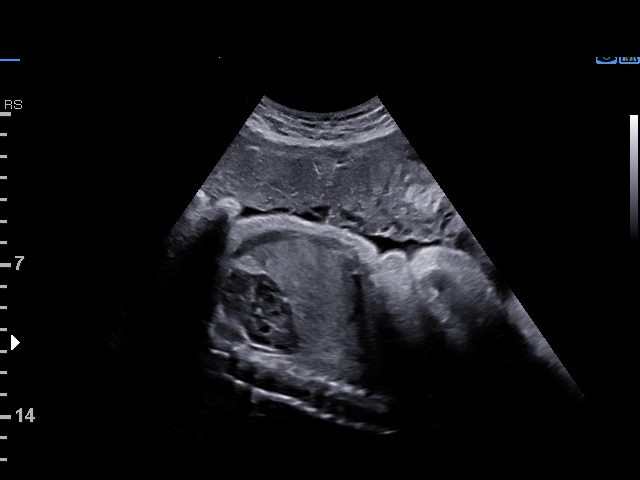
[im 4/28]
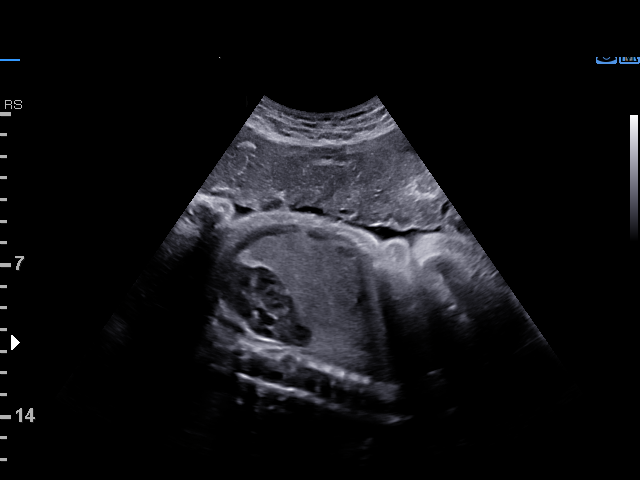
[im 6/28]
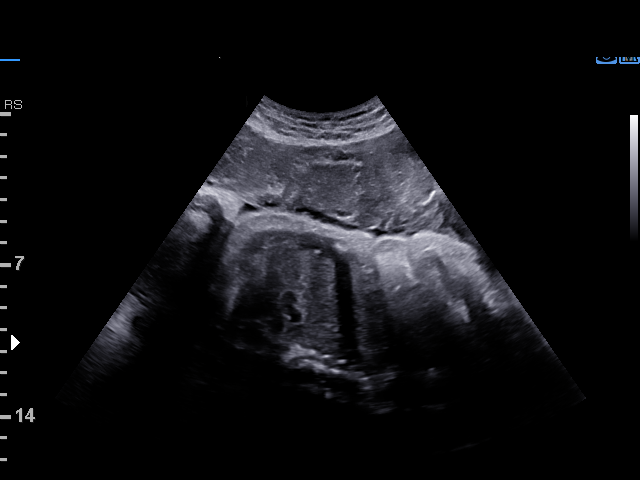
[im 9/28]
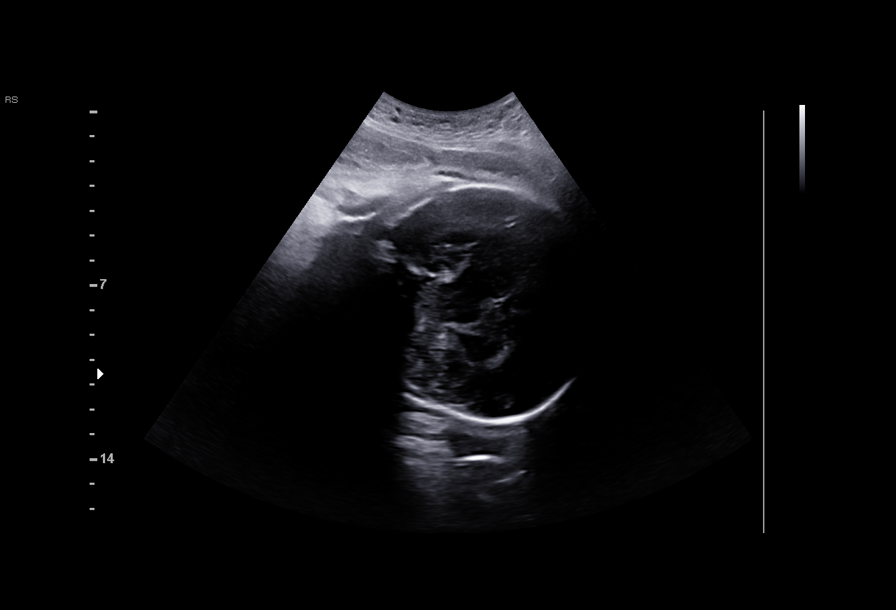
[im 11/28]
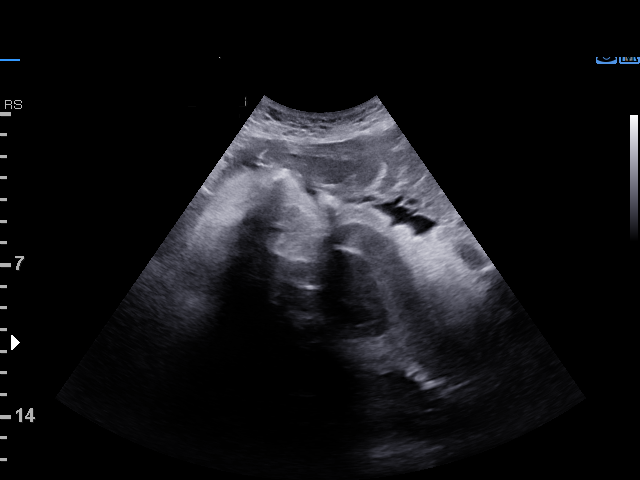
[im 13/28]
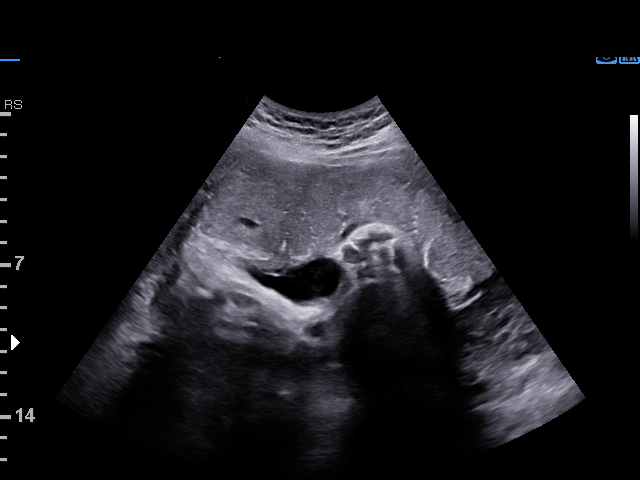
[im 16/28]
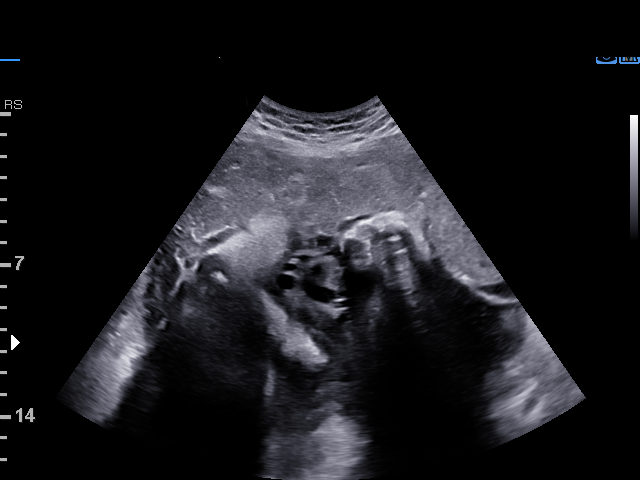
[im 18/28]
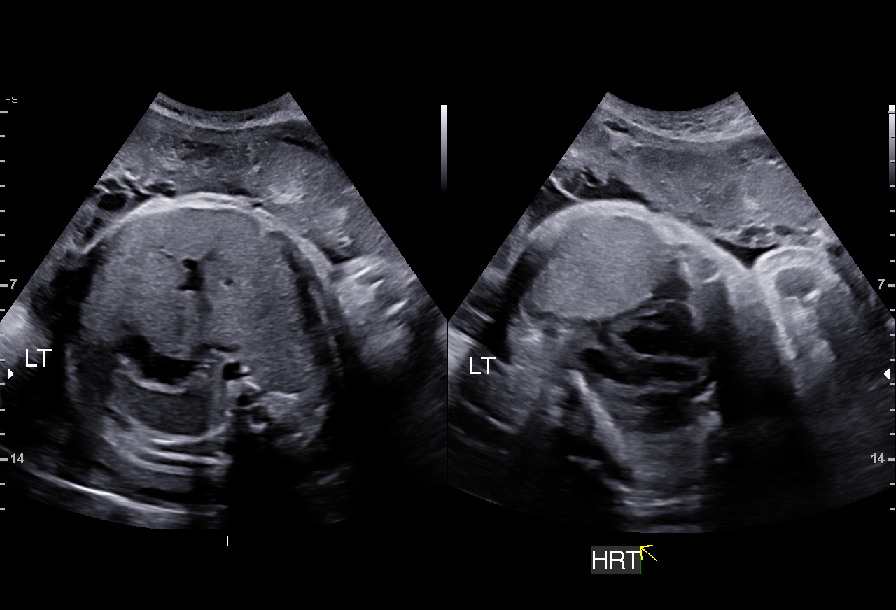
[im 20/28]
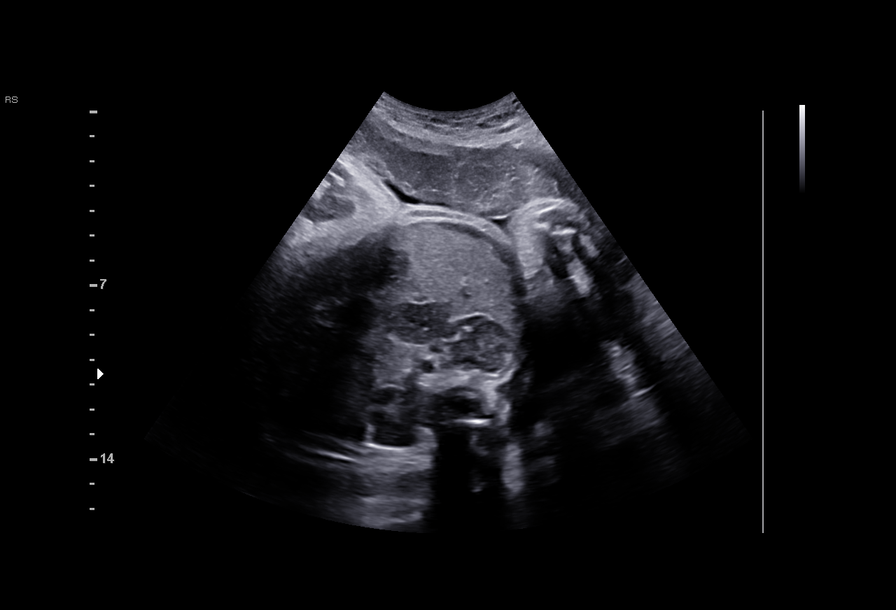
[im 23/28]
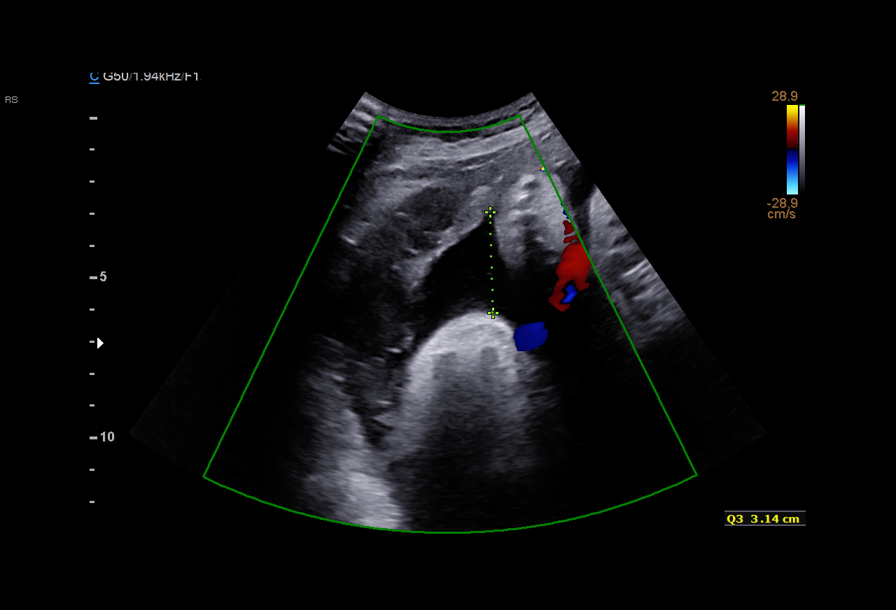
[im 25/28]
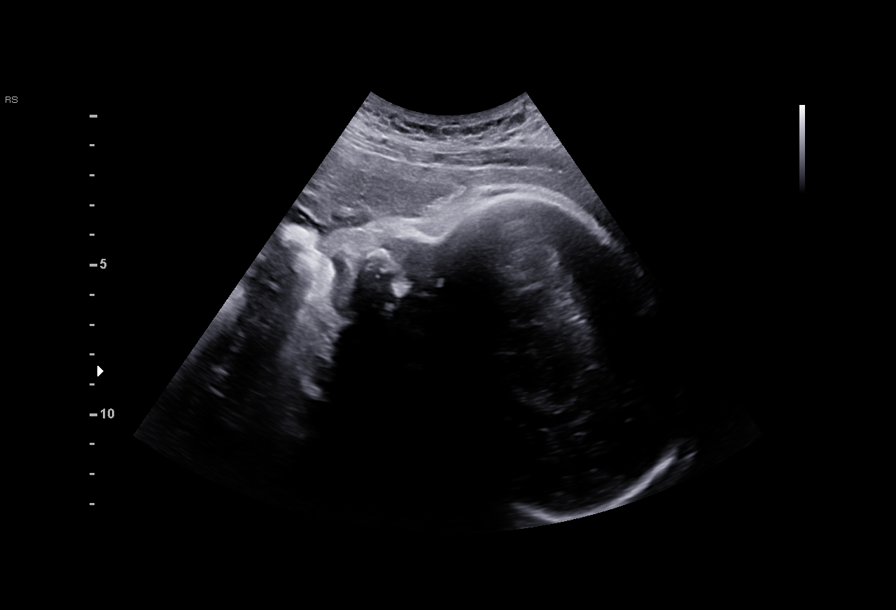
[im 27/28]
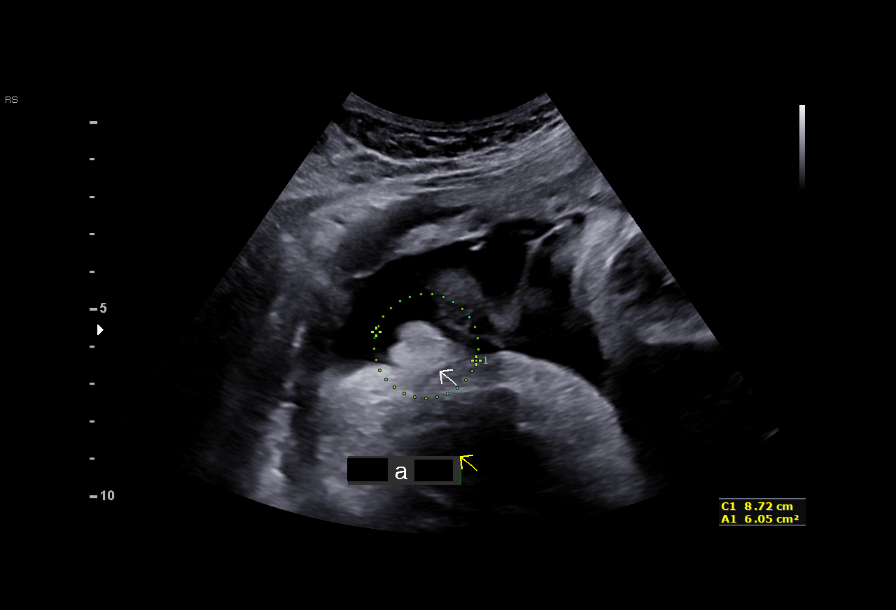

[12 of 28 positions shown; findings below may reference images not displayed]

[facility]
                   [id number]

     STRESS                                            [facility]
 ----------------------------------------------------------------------

 ----------------------------------------------------------------------
Indications

  Hypertension - Chronic/Pre-existing (ASA)
  Obesity complicating pregnancy, third
  trimester
  38 weeks gestation of pregnancy
 ----------------------------------------------------------------------
Vital Signs

                                                Height:        5'2"
Fetal Evaluation

 Num Of Fetuses:         1
 Fetal Heart Rate(bpm):  151
 Cardiac Activity:       Observed
 Presentation:           Cephalic

 Amniotic Fluid
 AFI FV:      Within normal limits

 AFI Sum(cm)     %Tile       Largest Pocket(cm)
 13.11           51
 RUQ(cm)       RLQ(cm)       LUQ(cm)        LLQ(cm)

Biophysical Evaluation

 Amniotic F.V:   Pocket => 2 cm two         F. Tone:        Observed
                 planes
 F. Movement:    Observed                   Score:          [DATE]
 F. Breathing:   Observed
OB History

 Gravidity:    4         Term:   3        Prem:   0        SAB:   0
 TOP:          0       Ectopic:  0        Living: 3
Gestational Age

 LMP:           39w 1d        Date:  10/13/17                 EDD:   07/20/18
 Best:          38w 3d     Det. By:  Early Ultrasound         EDD:   07/25/18
                                     (12/04/17)
Anatomy

 Stomach:               Appears normal, left   Bladder:                Appears normal
                        sided
 Kidneys:               Appear normal
Comments

 U/S images reviewed. Findings reviewed with patient.   No
 evidence of fetal compromise is found on BPP today.  No
 fetal abnormalities are seen.
 BP - 136/88.  Patient denies headaches, visual disturbance,
 mid-epigastic or RUQ pain.
 Questions answered.
 10 minutes spent face to face with patient.
 Recommendations: 1) Follow-up growth and BPP in 1 week if
 undelivered
Recommendations

  1) Follow-up growth and BPP in 1 week if undelivered
              Unver, Quinzinho

## 2019-08-23 ENCOUNTER — Emergency Department (HOSPITAL_COMMUNITY)
Admission: EM | Admit: 2019-08-23 | Discharge: 2019-08-23 | Disposition: A | Discharge status: Home / Self Care | Payer: Self-pay | Attending: Emergency Medicine | Admitting: Emergency Medicine

## 2019-08-23 ENCOUNTER — Emergency Department (HOSPITAL_COMMUNITY): Payer: Self-pay

## 2019-08-23 ENCOUNTER — Encounter (HOSPITAL_COMMUNITY): Payer: Self-pay | Admitting: Emergency Medicine

## 2019-08-23 ENCOUNTER — Other Ambulatory Visit: Payer: Self-pay

## 2019-08-23 DIAGNOSIS — I10 Essential (primary) hypertension: Secondary | ICD-10-CM | POA: Insufficient documentation

## 2019-08-23 DIAGNOSIS — Z20828 Contact with and (suspected) exposure to other viral communicable diseases: Secondary | ICD-10-CM | POA: Insufficient documentation

## 2019-08-23 DIAGNOSIS — B9689 Other specified bacterial agents as the cause of diseases classified elsewhere: Secondary | ICD-10-CM | POA: Insufficient documentation

## 2019-08-23 DIAGNOSIS — Z20822 Contact with and (suspected) exposure to covid-19: Secondary | ICD-10-CM

## 2019-08-23 DIAGNOSIS — Z87891 Personal history of nicotine dependence: Secondary | ICD-10-CM | POA: Insufficient documentation

## 2019-08-23 DIAGNOSIS — Z79899 Other long term (current) drug therapy: Secondary | ICD-10-CM | POA: Insufficient documentation

## 2019-08-23 DIAGNOSIS — J069 Acute upper respiratory infection, unspecified: Secondary | ICD-10-CM | POA: Insufficient documentation

## 2019-08-23 DIAGNOSIS — N760 Acute vaginitis: Secondary | ICD-10-CM | POA: Insufficient documentation

## 2019-08-23 LAB — URINALYSIS, ROUTINE W REFLEX MICROSCOPIC
Bilirubin Urine: NEGATIVE
Glucose, UA: NEGATIVE mg/dL
Ketones, ur: NEGATIVE mg/dL
Nitrite: NEGATIVE
Protein, ur: NEGATIVE mg/dL
Specific Gravity, Urine: 1.015 (ref 1.005–1.030)
pH: 6 (ref 5.0–8.0)

## 2019-08-23 LAB — POC URINE PREG, ED: Preg Test, Ur: NEGATIVE

## 2019-08-23 MED ORDER — METRONIDAZOLE 500 MG PO TABS: 500.0000 mg | ORAL_TABLET | Freq: Two times a day (BID) | ORAL | 0 refills | Status: DC

## 2019-08-23 NOTE — Discharge Instructions (Addendum)
You have a Covid test pending and you should quarantine at home until you receive your results, these should be back in about 2 to 3 days, you will receive a phone call if results are positive, you can view negative results online through [facility].  Please follow the instructions on your paperwork today to set up your MyChart account.  Take Flagyl as directed for BV, do not drink alcohol when taking this medication, if vaginal discharge does not improve please follow-up with your gynecologist or at the health department.  To treat your cough please use over-the-counter Mucinex twice daily, ibuprofen and Tylenol as needed for body aches or fevers.  Follow-up with your PCP.  Return to the ED for new or worsening symptoms.

## 2019-08-23 NOTE — ED Triage Notes (Signed)
C/o productive cough with green phlegm, congestion, and headache x 2 days.

## 2019-08-23 NOTE — ED Provider Notes (Signed)
[facility]   CSN: [id number] Arrival date & time: 08/23/19  1038     History Chief Complaint  Patient presents with  . Headache  . Cough  . Nasal Congestion  . Vaginal Discharge    [name] is a 33 y.o. female.  [name] is a 33 y.o. female with a history of anemia and hypertension, who presents to the emergency department for evaluation of productive cough and congestion.  She reports cough started initially as a mild occasional cough 2 weeks ago over the past week has become worse and productive and for the first time today when she arrived at work she had an elevated temperature around 100 and was sent to the ED for Covid testing.  States she has had some nasal congestion and intermittent headaches as well.  States she feels fatigued with some body aches, denies any chest pain or shortness of breath.  She denies any abdominal pain, nausea, vomiting or diarrhea.  No dysuria or urinary frequency.  Patient also states that she has been having some vaginal discharge with slight odor and thinks her pH balance may be off.  Patient states she has had BV in the past with similar symptoms.  States she is sexually active with one partner.  Denies associated pelvic pain.  No vaginal bleeding.  Last menstrual period on 12/25, currently with some continued light bleeding.  Had tubes tied.          Past Medical History:  Diagnosis Date  . Anemia   . Chronic hypertension during pregnancy, antepartum 12/24/2017   [x]  Aspirin 81 mg daily after 12 weeks; discontinue after 36 weeks Current antihypertensives:  None   Baseline and surveillance labs ([facility], refresh links as needed)  Lab Results Component Value Date  PLT 353 11/25/2016  CREATININE 0.50 11/24/2017  AST 24 11/25/2016  ALT 17 11/25/2016  PROTCRRATIO 0.30 (H) 12/31/2015   Antenatal Testing CHTN - O10.919  Group I  BP < 140/90, no preecl  . Hypertension   .  Pregnancy induced hypertension     Patient Active Problem List   Diagnosis Date Noted  . Hypertension     Past Surgical History:  Procedure Laterality Date  . TUBAL LIGATION Bilateral 07/20/2018   Procedure: POST PARTUM TUBAL LIGATION;  Surgeon: Levie HeritageStinson, Jacob J, DO;  Location: [facility];  Service: Gynecology;  Laterality: Bilateral;     OB History    Gravida  4   Para  4   Term  4   Preterm  0   AB  0   Living  4     SAB  0   TAB  0   Ectopic  0   Multiple  0   Live Births  4           Family History  Problem Relation Age of Onset  . Hypertension Mother     Social History   Tobacco Use  . Smoking status: Former Smoker    Types: Cigarettes    Quit date: 03/29/2013    Years since quitting: 6.4  . Smokeless tobacco: Never Used  Substance Use Topics  . Alcohol use: Yes  . Drug use: No    Home Medications Prior to Admission medications   Medication Sig Start Date End Date Taking? Authorizing Provider  amLODipine (NORVASC) 10 MG tablet TAKE 1 TABLET(10 MG) BY MOUTH DAILY 11/15/18   Judeth HornLawrence, Erin, NP  ferrous gluconate (FERGON) 324 MG tablet Take 1 tablet (324 mg total) by mouth 2 (two) times daily with a meal. 02/22/18   Stinson, Rhona Raider, DO  ibuprofen (ADVIL,MOTRIN) 600 MG tablet Take 1 tablet (600 mg total) by mouth every 6 (six) hours. 07/21/18   Tamera Stands, DO  metroNIDAZOLE (FLAGYL) 500 MG tablet Take 1 tablet (500 mg total) by mouth 2 (two) times daily. One po bid x 7 days 08/23/19   Dartha Lodge, PA-C  oxyCODONE (OXY IR/ROXICODONE) 5 MG immediate release tablet Take 1 tablet (5 mg total) by mouth every 6 (six) hours as needed (pain scale 4-7). Patient not taking: Reported on 08/31/2018 07/21/18   Tamera Stands, DO  Prenatal Vit-DSS-Fe Fum-FA (PRENATAL 19) tablet Take 1 tablet by mouth daily. 03/22/18   Lesly Dukes, MD    Allergies    Patient has no known allergies.  Review of Systems   Review of Systems    Constitutional: Positive for chills and fever.  HENT: Positive for congestion and rhinorrhea.   Respiratory: Positive for cough. Negative for shortness of breath.   Cardiovascular: Negative for chest pain.  Gastrointestinal: Negative for abdominal pain, diarrhea, nausea and vomiting.  Genitourinary: Positive for vaginal bleeding (on menstrual cycle) and vaginal discharge. Negative for dysuria and frequency.  Musculoskeletal: Positive for myalgias. Negative for arthralgias.  Skin: Negative for color change and rash.  Neurological: Positive for headaches. Negative for weakness and numbness.    Physical Exam Updated Vital Signs BP (!) 156/96 (BP Location: Left Arm)   Pulse 72   Temp 98.2 F (36.8 C) (Oral)   Resp 16   LMP 08/19/2019   SpO2 100%   Physical Exam Vitals and nursing note reviewed. Exam conducted with a chaperone present.  Constitutional:      General: She is not in acute distress.    Appearance: She is well-developed and normal weight. She is not ill-appearing or diaphoretic.     Comments: Well-appearing and in no distress  HENT:     Head: Normocephalic and atraumatic.     Mouth/Throat:     Mouth: Mucous membranes are moist.     Pharynx: Oropharynx is clear.  Eyes:     General:        Right eye: No discharge.        Left eye: No discharge.  Neck:     Comments: No rigidity Cardiovascular:     Rate and Rhythm: Normal rate and regular rhythm.     Heart sounds: Normal heart sounds.  Pulmonary:     Effort: Pulmonary effort is normal. No respiratory distress.     Breath sounds: Normal breath sounds.     Comments: Respirations equal and unlabored, patient able to speak in full sentences, lungs clear to auscultation bilaterally, intermittent cough during exam. Abdominal:     General: Bowel sounds are normal. There is no distension.     Palpations: Abdomen is soft. There is no mass.     Tenderness: There is no abdominal tenderness. There is no guarding.      Comments: Abdomen soft, nondistended, nontender to palpation in all quadrants without guarding or peritoneal signs  Genitourinary:    Comments: Chaperone present during pelvic exam. No external genital lesions noted. Speculum exam reveals small amount of vaginal bleeding mixed in with white-gray discharge.  On bimanual exam there is no cervical motion tenderness, no focal adnexal or uterine tenderness. Musculoskeletal:        General: No  deformity.     Cervical back: Neck supple.  Lymphadenopathy:     Cervical: No cervical adenopathy.  Skin:    General: Skin is warm and dry.     Capillary Refill: Capillary refill takes less than 2 seconds.  Neurological:     Mental Status: She is alert and oriented to person, place, and time.     GCS: GCS eye subscore is 4. GCS verbal subscore is 5. GCS motor subscore is 6.     Coordination: Coordination normal.  Psychiatric:        Mood and Affect: Mood normal.        Behavior: Behavior normal.     ED Results / Procedures / Treatments   Labs (all labs ordered are listed, but only abnormal results are displayed) Labs Reviewed  URINALYSIS, ROUTINE W REFLEX MICROSCOPIC - Abnormal; Notable for the following components:      Result Value   Hgb urine dipstick LARGE (*)    Leukocytes,Ua SMALL (*)    Bacteria, UA FEW (*)    All other components within normal limits  NOVEL CORONAVIRUS, NAA (HOSP ORDER, SEND-OUT TO REF LAB; TAT 18-24 HRS)  POC URINE PREG, ED  GC/CHLAMYDIA PROBE AMP ([place]) NOT AT [facility] [place]    EKG None  Radiology DG Chest Port 1 View  Result Date: 08/23/2019 CLINICAL DATA:  Cough and shortness of breath EXAM: PORTABLE CHEST 1 VIEW COMPARISON:  October 02, 2009 FINDINGS: Lungs are clear. Heart size and pulmonary vascularity are normal. No adenopathy. No bone lesions. IMPRESSION: Lungs clear.  No evident adenopathy. Electronically Signed   By: Lowella Grip III M.D.   On: 08/23/2019 11:20    Procedures Procedures (including  critical care time)  Medications Ordered in ED Medications - No data to display  ED Course  I have reviewed the triage vital signs and the nursing notes.  Pertinent labs & imaging results that were available during my care of the patient were reviewed by me and considered in my medical decision making (see chart for details).  Clinical Course as of Aug 22 1640  Tue Aug 23, 1427  5543 33 year old female presents with 1-2 weeks of cough, cough has become increasingly productive over the past few days and today at work she was noted to have a low-grade fever of 100 and was sent in for Covid testing.  No known sick contacts.  No other infectious symptoms.  No chest pain or shortness of breath.  Satting 100% on room air.  Patient also reporting vaginal discharge with slight odor and requesting testing, history of BV and this feels similar, denies urinary symptoms.   [KF]  1120 Chest x-ray is clear with no infiltrates or other active cardiopulmonary disease  DG Chest Port 1 View [KF]  1220 Wet prep discontinued by lab, discussed with patient whether she would like to recollect wet prep or just be treated with Flagyl for BV since that is what her symptoms and exam are most consistent with, patient elects to be treated with antibiotics rather than repeating swab.  Wet prep, genital [KF]  1330 Urinalysis with a few RBCs and WBCs, but without urinary symptoms unlikely infection.  Urinalysis, Routine w reflex microscopic(!) [KF]    Clinical Course User Index [KF] Janet Berlin   MDM Rules/Calculators/A&P                      Patient presents with 1 week of productive cough, found to have  low-grade fever at temperature check at work today and sent in for Covid testing.  Covid PCR sent, chest x-ray is clear.  Patient also complained of vaginal discharge with slight odor, and thinks she has BV, history of the same, GC chlamydia testing sent, urinalysis reassuring.  Wet prep was discontinued by  lab, patient elected to just continue with Flagyl for potential BV and she will follow up with OB/GYN if not improving rather than repeat swab.  Discussed home quarantine, PCP follow-up and strict return precautions regarding Covid, discussed that this could also be bronchitis or viral upper respiratory infection.  Discussed appropriate treatment.  Patient expresses understanding and agreement.  Discharged home in good condition.  REYES FIFIELD was evaluated in Emergency Department on 08/23/2019 for the symptoms described in the history of present illness. She was evaluated in the context of the global COVID-19 pandemic, which necessitated consideration that the patient might be at risk for infection with the SARS-CoV-2 virus that causes COVID-19. Institutional protocols and algorithms that pertain to the evaluation of patients at risk for COVID-19 are in a state of rapid change based on information released by regulatory bodies including the CDC and federal and state organizations. These policies and algorithms were followed during the patient's care in the ED. Final Clinical Impression(s) / ED Diagnoses Final diagnoses:  Viral URI with cough  BV (bacterial vaginosis)  Encounter for laboratory testing for COVID-19 virus    Rx / DC Orders ED Discharge Orders         Ordered    metroNIDAZOLE (FLAGYL) 500 MG tablet  2 times daily     08/23/19 1326           Jodi Geralds [place], [place] 08/23/19 1641    Charlynne Pander, MD 08/24/19 [phone]

## 2019-08-24 LAB — NOVEL CORONAVIRUS, NAA (HOSP ORDER, SEND-OUT TO REF LAB; TAT 18-24 HRS): SARS-CoV-2, NAA: NOT DETECTED

## 2019-08-24 LAB — GC/CHLAMYDIA PROBE AMP (~~LOC~~) NOT AT ARMC
Chlamydia: NEGATIVE
Neisseria Gonorrhea: NEGATIVE

## 2019-10-10 ENCOUNTER — Other Ambulatory Visit: Payer: Self-pay

## 2019-10-10 ENCOUNTER — Encounter (HOSPITAL_COMMUNITY): Payer: Self-pay | Admitting: Emergency Medicine

## 2019-10-10 ENCOUNTER — Ambulatory Visit (HOSPITAL_COMMUNITY)
Admission: EM | Admit: 2019-10-10 | Discharge: 2019-10-10 | Disposition: A | Discharge status: Home / Self Care | Payer: Self-pay | Attending: Family Medicine | Admitting: Family Medicine

## 2019-10-10 DIAGNOSIS — I10 Essential (primary) hypertension: Secondary | ICD-10-CM | POA: Insufficient documentation

## 2019-10-10 DIAGNOSIS — Z3202 Encounter for pregnancy test, result negative: Secondary | ICD-10-CM

## 2019-10-10 DIAGNOSIS — N898 Other specified noninflammatory disorders of vagina: Secondary | ICD-10-CM | POA: Insufficient documentation

## 2019-10-10 LAB — POCT URINALYSIS DIP (DEVICE)
Bilirubin Urine: NEGATIVE
Glucose, UA: NEGATIVE mg/dL
Ketones, ur: NEGATIVE mg/dL
Nitrite: NEGATIVE
Protein, ur: 30 mg/dL — AB
Specific Gravity, Urine: 1.025 (ref 1.005–1.030)
Urobilinogen, UA: 0.2 mg/dL (ref 0.0–1.0)
pH: 7 (ref 5.0–8.0)

## 2019-10-10 MED ORDER — AMLODIPINE BESYLATE 10 MG PO TABS: ORAL_TABLET | ORAL | 0 refills | Status: DC

## 2019-10-10 MED ORDER — FLUCONAZOLE 150 MG PO TABS: 150.0000 mg | ORAL_TABLET | Freq: Once | ORAL | 0 refills | Status: AC

## 2019-10-10 NOTE — Discharge Instructions (Signed)
Take 1 tablet of Diflucan today to treat for yeast infection.  May repeat second tablet if still having symptoms and swab returns positive for yeast. Vaginal swab pending to check for gonorrhea, chlamydia, trichomonas, yeast and BV.  We will call with results and provide further treatment as needed.  Your blood pressure was elevated today in clinic. Please be sure to take blood pressure medications as prescribed. Please monitor your blood pressure at home or when you go to a [facility]/Gym. Please follow up with your primary care doctor to recheck blood pressure and discuss any need for medication changes.   Please go to Emergency Room if you start to experience severe headache, vision changes, decreased urine production, chest pain, shortness of breath, speech slurring, one sided weakness.

## 2019-10-10 NOTE — ED Triage Notes (Signed)
Pt here for possible elevated BP.  Pt states she takes Norvasc.  She states she has been seeing stars and her home BP machine has been 140's/100/s.  Pt also reports vaginal discharge 2 days, with some mild lower abdominal cramping.

## 2019-10-10 NOTE — ED Provider Notes (Signed)
[place]    CSN: [id number] Arrival date & time: 10/10/19  1007      History   Chief Complaint Chief Complaint  Patient presents with  . Hypertension  . Vaginitis    HPI DAVYN ELSASSER is a 34 y.o. female history of hypertension, presenting today for evaluation of elevated blood pressure and vaginal discharge.  Patient states that she believes her blood pressure has been elevated of recently noting 656 systolics in the 812X diastolic.  She is on amlodipine 10 mg daily.  Notes that she is no longer working due to the pandemic and is looking for a new primary care.  Denies any chest pain or shortness of breath.  Denies any headache.  Does report some occasional spots with vision and attributes this to her blood pressure.  Denies any difficulty speaking or weakness.  Has also had discharge developed over the past 2 days.  Reports that it is yellow.  Denies any significant irritation or itching.  Believe symptoms may be similar to prior yeast infections, but also has history of BV.  Denies any specific concerns for STDs or any new partners.  Has prior tubal ligation.  Last menstrual cycle was around 1/28.  Denies any abdominal pain.  Denies rashes or lesions.  HPI  Past Medical History:  Diagnosis Date  . Anemia   . Chronic hypertension during pregnancy, antepartum 12/24/2017   [x]  Aspirin 81 mg daily after 12 weeks; discontinue after 36 weeks Current antihypertensives:  None   Baseline and surveillance labs (pulled in from [facility], refresh links as needed)  Lab Results Component Value Date  PLT 353 11/25/2016  CREATININE 0.50 11/24/2017  AST 24 11/25/2016  ALT 17 11/25/2016  PROTCRRATIO 0.30 (H) 12/31/2015   Antenatal Testing CHTN - O10.919  Group I  BP < 140/90, no preecl  . Hypertension   . Pregnancy induced hypertension     Patient Active Problem List   Diagnosis Date Noted  . Hypertension     Past Surgical History:  Procedure Laterality Date  . TUBAL LIGATION  Bilateral 07/20/2018   Procedure: POST PARTUM TUBAL LIGATION;  Surgeon: Truett Mainland, DO;  Location: [place];  Service: Gynecology;  Laterality: Bilateral;    OB History    Gravida  4   Para  4   Term  4   Preterm  0   AB  0   Living  4     SAB  0   TAB  0   Ectopic  0   Multiple  0   Live Births  4            Home Medications    Prior to Admission medications   Medication Sig Start Date End Date Taking? Authorizing Provider  ferrous gluconate (FERGON) 324 MG tablet Take 1 tablet (324 mg total) by mouth 2 (two) times daily with a meal. 02/22/18  Yes Truett Mainland, DO  Prenatal Vit-DSS-Fe Fum-FA (PRENATAL 19) tablet Take 1 tablet by mouth daily. 03/22/18  Yes Guss Bunde, MD  amLODipine (NORVASC) 10 MG tablet TAKE 1 TABLET(10 MG) BY MOUTH DAILY 10/10/19   Andi Mahaffy C, PA-C  fluconazole (DIFLUCAN) 150 MG tablet Take 1 tablet (150 mg total) by mouth once for 1 dose. 10/10/19 10/10/19  Cindy Brindisi C, PA-C  ibuprofen (ADVIL,MOTRIN) 600 MG tablet Take 1 tablet (600 mg total) by mouth every 6 (six) hours. 07/21/18   Glenice Bow, DO  Family History Family History  Problem Relation Age of Onset  . Hypertension Mother     Social History Social History   Tobacco Use  . Smoking status: Former Smoker    Types: Cigarettes    Quit date: 03/29/2013    Years since quitting: 6.5  . Smokeless tobacco: Never Used  Substance Use Topics  . Alcohol use: Yes  . Drug use: No     Allergies   Patient has no known allergies.   Review of Systems Review of Systems  Constitutional: Negative for fever.  Respiratory: Negative for shortness of breath.   Cardiovascular: Negative for chest pain.  Gastrointestinal: Negative for abdominal pain, diarrhea, nausea and vomiting.  Genitourinary: Positive for frequency and vaginal discharge. Negative for dysuria, flank pain, genital sores, hematuria, menstrual problem, vaginal bleeding and vaginal  pain.  Musculoskeletal: Negative for back pain.  Skin: Negative for rash.  Neurological: Negative for dizziness, light-headedness and headaches.     Physical Exam Triage Vital Signs ED Triage Vitals  Enc Vitals Group     BP 10/10/19 1026 (!) 157/97     Pulse Rate 10/10/19 1026 83     Resp 10/10/19 1026 16     Temp 10/10/19 1026 98.5 F (36.9 C)     Temp Source 10/10/19 1026 Oral     SpO2 10/10/19 1026 100 %     Weight --      Height --      Head Circumference --      Peak Flow --      Pain Score 10/10/19 1028 3     Pain Loc --      Pain Edu? --      Excl. in GC? --    No data found.  Updated Vital Signs BP (!) 157/97 (BP Location: Right Arm)   Pulse 83   Temp 98.5 F (36.9 C) (Oral)   Resp 16   LMP 09/22/2019 (Exact Date)   SpO2 100%   Breastfeeding No   Visual Acuity Right Eye Distance:   Left Eye Distance:   Bilateral Distance:    Right Eye Near:   Left Eye Near:    Bilateral Near:     Physical Exam Vitals and nursing note reviewed.  Constitutional:      Appearance: She is well-developed.     Comments: No acute distress  HENT:     Head: Normocephalic and atraumatic.     Nose: Nose normal.  Eyes:     Extraocular Movements: Extraocular movements intact.     Conjunctiva/sclera: Conjunctivae normal.     Pupils: Pupils are equal, round, and reactive to light.  Cardiovascular:     Rate and Rhythm: Normal rate.  Pulmonary:     Effort: Pulmonary effort is normal. No respiratory distress.     Comments: Breathing comfortably at rest, CTABL, no wheezing, rales or other adventitious sounds auscultated Abdominal:     General: There is no distension.     Comments: Soft, nondistended, nontender to light and deep palpation throughout abdomen  Musculoskeletal:        General: Normal range of motion.     Cervical back: Neck supple.  Skin:    General: Skin is warm and dry.  Neurological:     Mental Status: She is alert and oriented to person, place, and time.        UC Treatments / Results  Labs (all labs ordered are listed, but only abnormal results are displayed) Labs Reviewed  POCT  URINALYSIS DIP (DEVICE) - Abnormal; Notable for the following components:      Result Value   Hgb urine dipstick SMALL (*)    Protein, ur 30 (*)    Leukocytes,Ua LARGE (*)    All other components within normal limits  URINE CULTURE  POCT PREGNANCY, URINE  CERVICOVAGINAL ANCILLARY ONLY    EKG   Radiology No results found.  Procedures Procedures (including critical care time)  Medications Ordered in UC Medications - No data to display  Initial Impression / Assessment and Plan / UC Course  I have reviewed the triage vital signs and the nursing notes.  Pertinent labs & imaging results that were available during my care of the patient were reviewed by me and considered in my medical decision making (see chart for details).     Pregnancy test negative, large leuks on UA, will send for urine culture, but symptoms more suggestive of discharge/vaginitis as cause of leukocytes.  Empirically treating for yeast today with Diflucan.  Vaginal swab pending to evaluate for gonorrhea, chlamydia trichomonas as well as yeast and BV.  Will alter treatment as needed based off results.  Refilled amlodipine, no red flag symptoms at this time.  Provided contact information for to primary care's to follow-up with for further evaluation and management of blood pressure.  Discussed strict return precautions. Patient verbalized understanding and is agreeable with plan.  Final Clinical Impressions(s) / UC Diagnoses   Final diagnoses:  Vaginal discharge  Essential hypertension     Discharge Instructions     Take 1 tablet of Diflucan today to treat for yeast infection.  May repeat second tablet if still having symptoms and swab returns positive for yeast. Vaginal swab pending to check for gonorrhea, chlamydia, trichomonas, yeast and BV.  We will call with results and  provide further treatment as needed.  Your blood pressure was elevated today in clinic. Please be sure to take blood pressure medications as prescribed. Please monitor your blood pressure at home or when you go to a [facility]/[facility]. Please follow up with your primary care doctor to recheck blood pressure and discuss any need for medication changes.   Please go to Emergency Room if you start to experience severe headache, vision changes, decreased urine production, chest pain, shortness of breath, speech slurring, one sided weakness.    ED Prescriptions    Medication Sig Dispense Auth. Provider   amLODipine (NORVASC) 10 MG tablet TAKE 1 TABLET(10 MG) BY MOUTH DAILY 90 tablet Otha Monical C, PA-C   fluconazole (DIFLUCAN) 150 MG tablet Take 1 tablet (150 mg total) by mouth once for 1 dose. 2 tablet Santiana Glidden, [place] C, PA-C     PDMP not reviewed this encounter.   Lew Dawes, [place] 10/10/19 1151

## 2019-10-11 LAB — URINE CULTURE: Culture: 20000 — AB

## 2019-10-13 ENCOUNTER — Other Ambulatory Visit: Payer: Self-pay

## 2019-10-13 ENCOUNTER — Emergency Department (HOSPITAL_COMMUNITY)
Admission: EM | Admit: 2019-10-13 | Discharge: 2019-10-13 | Disposition: A | Discharge status: Home / Self Care | Payer: Self-pay | Attending: Emergency Medicine | Admitting: Emergency Medicine

## 2019-10-13 ENCOUNTER — Encounter (HOSPITAL_COMMUNITY): Payer: Self-pay

## 2019-10-13 DIAGNOSIS — N899 Noninflammatory disorder of vagina, unspecified: Secondary | ICD-10-CM | POA: Insufficient documentation

## 2019-10-13 DIAGNOSIS — N898 Other specified noninflammatory disorders of vagina: Secondary | ICD-10-CM

## 2019-10-13 LAB — CERVICOVAGINAL ANCILLARY ONLY
Bacterial vaginitis: NEGATIVE
Candida vaginitis: NEGATIVE
Chlamydia: NEGATIVE
Neisseria Gonorrhea: NEGATIVE
Trichomonas: NEGATIVE

## 2019-10-13 NOTE — Discharge Instructions (Signed)
I have reviewed all the laboratory work-up from 3 days ago which is not suggestive of any acute vaginal pathology requiring antibiotics or other intervention at this time.  Given the report of vaginal discharge without any abnormal findings from comprehensive work-up, suspect physiologic etiology.  Urine culture was not remarkable for UTI as it reflects normal flora.    Rather than repeating work-up, I suggest that you go to the women's health care clinic for ongoing evaluation and management should your symptoms fail to improve.    Please return to the ED or seek immediate medical attention for any new or worsening symptoms.

## 2019-10-13 NOTE — ED Provider Notes (Signed)
[facility] EMERGENCY DEPARTMENT Provider Note   CSN: [id number] Arrival date & time: 10/13/19  1648     History Chief Complaint  Patient presents with  . vaginal discharge    [name] is a 34 y.o. female with no relevant PMH who presents to the ED with continued vaginal discharge.  Patient was evaluated at the urgent care 10/10/2019 for complaints of vaginal discharge.  A comprehensive work-up was performed and she was ultimately discharged with Diflucan.  She noticed on her MyChart that her urine culture was abnormal which also prompted her to come to the ED for additional evaluation.  My exam, she endorses yellow vaginal discharge, however denies any fevers or chills, abdominal pain, nausea or vomiting, vaginal pain, malodorous discharge, concern for STIs, pain with defecation, dizziness, back pain, hematuria, vaginal bleeding, dyspareunia, dysuria, increased urinary fluency, or other symptoms.  HPI     Past Medical History:  Diagnosis Date  . Anemia   . Chronic hypertension during pregnancy, antepartum 12/24/2017   [x]  Aspirin 81 mg daily after 12 weeks; discontinue after 36 weeks Current antihypertensives:  None   Baseline and surveillance labs (pulled in from [facility], refresh links as needed)  Lab Results Component Value Date  PLT 353 11/25/2016  CREATININE 0.50 11/24/2017  AST 24 11/25/2016  ALT 17 11/25/2016  PROTCRRATIO 0.30 (H) 12/31/2015   Antenatal Testing CHTN - O10.919  Group I  BP < 140/90, no preecl  . Hypertension   . Pregnancy induced hypertension     Patient Active Problem List   Diagnosis Date Noted  . Hypertension     Past Surgical History:  Procedure Laterality Date  . TUBAL LIGATION Bilateral 07/20/2018   Procedure: POST PARTUM TUBAL LIGATION;  Surgeon: Truett Mainland, DO;  Location: [facility];  Service: Gynecology;  Laterality: Bilateral;     OB History    Gravida  4   Para  4   Term  4   Preterm  0   AB  0    Living  4     SAB  0   TAB  0   Ectopic  0   Multiple  0   Live Births  4           Family History  Problem Relation Age of Onset  . Hypertension Mother     Social History   Tobacco Use  . Smoking status: Former Smoker    Types: Cigarettes    Quit date: 03/29/2013    Years since quitting: 6.5  . Smokeless tobacco: Never Used  Substance Use Topics  . Alcohol use: Yes  . Drug use: No    Home Medications Prior to Admission medications   Medication Sig Start Date End Date Taking? Authorizing Provider  amLODipine (NORVASC) 10 MG tablet TAKE 1 TABLET(10 MG) BY MOUTH DAILY 10/10/19   Wieters, Hallie C, PA-C  ferrous gluconate (FERGON) 324 MG tablet Take 1 tablet (324 mg total) by mouth 2 (two) times daily with a meal. 02/22/18   Stinson, Tanna Savoy, DO  ibuprofen (ADVIL,MOTRIN) 600 MG tablet Take 1 tablet (600 mg total) by mouth every 6 (six) hours. 07/21/18   Glenice Bow, DO  Prenatal Vit-DSS-Fe Fum-FA (PRENATAL 19) tablet Take 1 tablet by mouth daily. 03/22/18   Guss Bunde, MD    Allergies    Patient has no known allergies.  Review of Systems   Review of Systems  Constitutional: Negative for fever.  Gastrointestinal: Negative for abdominal pain and nausea.  Genitourinary: Positive for vaginal discharge. Negative for decreased urine volume, dyspareunia, dysuria, flank pain, frequency, genital sores, hematuria, menstrual problem, pelvic pain, urgency, vaginal bleeding and vaginal pain.  Skin: Negative for rash.    Physical Exam Updated Vital Signs BP (!) 148/106   Pulse 89   Temp 98.1 F (36.7 C) (Oral)   Resp 16   Ht 5\' 2"  (1.575 m)   Wt 93 kg   LMP 09/22/2019 (Exact Date)   SpO2 100%   BMI 37.49 kg/m   Physical Exam Vitals and nursing note reviewed. Exam conducted with a chaperone present.  Constitutional:      Appearance: Normal appearance.  HENT:     Head: Normocephalic and atraumatic.  Eyes:     General: No scleral icterus.     Conjunctiva/sclera: Conjunctivae normal.  Cardiovascular:     Rate and Rhythm: Normal rate and regular rhythm.     Pulses: Normal pulses.     Heart sounds: Normal heart sounds.  Pulmonary:     Effort: Pulmonary effort is normal.     Breath sounds: Normal breath sounds.  Abdominal:     Comments: Soft, nondistended.  No TTP.  No guarding.  No mass appreciated.  No overlying skin changes.  No CVA tenderness.  Skin:    General: Skin is dry.  Neurological:     Mental Status: She is alert.     GCS: GCS eye subscore is 4. GCS verbal subscore is 5. GCS motor subscore is 6.  Psychiatric:        Mood and Affect: Mood normal.        Behavior: Behavior normal.        Thought Content: Thought content normal.     ED Results / Procedures / Treatments   Labs (all labs ordered are listed, but only abnormal results are displayed) Labs Reviewed - No data to display  EKG None  Radiology No results found.  Procedures Procedures (including critical care time)  Medications Ordered in ED Medications - No data to display  ED Course  I have reviewed the triage vital signs and the nursing notes.  Pertinent labs & imaging results that were available during my care of the patient were reviewed by me and considered in my medical decision making (see chart for details).    MDM Rules/Calculators/A&P                      I reviewed labs obtained 3 days ago including UA and urine culture, point-of-care urine pregnancy, as well as testing for chlamydia, gonorrhea, BV, Candida, trichomoniasis.  She was pan negative and there has been no change since her discharge from the urgent care.  Her urine culture did not demonstrate 20,000 colonies group B strep, however educated patient that it is not representative of a UTI and the bacteria is normal urogenital germ flora.  While, patient denies any dysuria or increased frequency otherwise concerning for UTI.  Given that her only complaint is vaginal discharge  despite comprehensive work-up, do not feel as though repeat testing is warranted at this time.  Instead, will refer patient to women[facility] health center for ongoing evaluation and management should her symptoms continue to persist.  Suspect they will be with self-limiting.  She denies any pain , nausea or vomiting, fevers, pregnancy, vaginal bleeding, or any other concerning history or symptoms.    I discussed my concern for prescribing antibiotics in the  absence of suspected bacterial infection and how it contributes to antibiotic resistance.  It would be of no benefit to the patient instead because potential adverse effects.  While she is still struggling believe that her laboratory work-up would all be negative, she understands and agrees with decision to repeat testing and avoid antibiotics at this time.  She plans to follow-up with [facility], as suggested.  Strict ED return precautions discussed.  Patient voiced understanding.  Final Clinical Impression(s) / ED Diagnoses Final diagnoses:  Vaginal discharge    Rx / DC Orders ED Discharge Orders    None       Lorelee New, PA-C 10/13/19 Larence Penning, MD 10/13/19 2258

## 2019-10-13 NOTE — ED Triage Notes (Signed)
Pt states that she has arrived here today because of continued yellow vaginal d/c that she has already went to Urgent care about and took diflucan for.

## 2020-05-01 ENCOUNTER — Encounter (HOSPITAL_COMMUNITY): Payer: Self-pay | Admitting: Emergency Medicine

## 2020-05-01 ENCOUNTER — Emergency Department (HOSPITAL_COMMUNITY): Payer: Self-pay

## 2020-05-01 ENCOUNTER — Emergency Department (HOSPITAL_COMMUNITY)
Admission: EM | Admit: 2020-05-01 | Discharge: 2020-05-01 | Disposition: A | Discharge status: Home / Self Care | Payer: Self-pay | Attending: Emergency Medicine | Admitting: Emergency Medicine

## 2020-05-01 ENCOUNTER — Other Ambulatory Visit: Payer: Self-pay

## 2020-05-01 DIAGNOSIS — I10 Essential (primary) hypertension: Secondary | ICD-10-CM | POA: Insufficient documentation

## 2020-05-01 DIAGNOSIS — N3001 Acute cystitis with hematuria: Secondary | ICD-10-CM | POA: Insufficient documentation

## 2020-05-01 DIAGNOSIS — Z79899 Other long term (current) drug therapy: Secondary | ICD-10-CM | POA: Insufficient documentation

## 2020-05-01 DIAGNOSIS — Z87891 Personal history of nicotine dependence: Secondary | ICD-10-CM | POA: Insufficient documentation

## 2020-05-01 DIAGNOSIS — Z20822 Contact with and (suspected) exposure to covid-19: Secondary | ICD-10-CM | POA: Insufficient documentation

## 2020-05-01 DIAGNOSIS — R110 Nausea: Secondary | ICD-10-CM | POA: Insufficient documentation

## 2020-05-01 DIAGNOSIS — B349 Viral infection, unspecified: Secondary | ICD-10-CM | POA: Insufficient documentation

## 2020-05-01 DIAGNOSIS — R197 Diarrhea, unspecified: Secondary | ICD-10-CM

## 2020-05-01 LAB — COMPREHENSIVE METABOLIC PANEL
ALT: 11 U/L (ref 0–44)
AST: 20 U/L (ref 15–41)
Albumin: 3.7 g/dL (ref 3.5–5.0)
Alkaline Phosphatase: 43 U/L (ref 38–126)
Anion gap: 10 (ref 5–15)
BUN: 8 mg/dL (ref 6–20)
CO2: 20 mmol/L — ABNORMAL LOW (ref 22–32)
Calcium: 8.7 mg/dL — ABNORMAL LOW (ref 8.9–10.3)
Chloride: 108 mmol/L (ref 98–111)
Creatinine, Ser: 0.6 mg/dL (ref 0.44–1.00)
GFR calc Af Amer: >60 mL/min (ref >60)
GFR calc non Af Amer: >60 mL/min (ref >60)
Glucose, Bld: 108 mg/dL — ABNORMAL HIGH (ref 70–99)
Potassium: 3.7 mmol/L (ref 3.5–5.1)
Sodium: 138 mmol/L (ref 135–145)
Total Bilirubin: 0.8 mg/dL (ref 0.3–1.2)
Total Protein: 6.8 g/dL (ref 6.5–8.1)

## 2020-05-01 LAB — CBC
HCT: 36.2 % (ref 36.0–46.0)
Hemoglobin: 12.1 g/dL (ref 12.0–15.0)
MCH: 29.7 pg (ref 26.0–34.0)
MCHC: 33.4 g/dL (ref 30.0–36.0)
MCV: 88.9 fL (ref 80.0–100.0)
Platelets: 378 10*3/uL (ref 150–400)
RBC: 4.07 MIL/uL (ref 3.87–5.11)
RDW: 13.3 % (ref 11.5–15.5)
WBC: 7.4 10*3/uL (ref 4.0–10.5)
nRBC: 0 % (ref 0.0–0.2)

## 2020-05-01 LAB — URINALYSIS, ROUTINE W REFLEX MICROSCOPIC
Bilirubin Urine: NEGATIVE
Glucose, UA: NEGATIVE mg/dL
Ketones, ur: NEGATIVE mg/dL
Nitrite: NEGATIVE
Protein, ur: 30 mg/dL — AB
Specific Gravity, Urine: 1.017 (ref 1.005–1.030)
Squamous Epithelial / HPF: >50 — ABNORMAL HIGH (ref 0–5)
WBC, UA: >50 WBC/hpf — ABNORMAL HIGH (ref 0–5)
pH: 5 (ref 5.0–8.0)

## 2020-05-01 LAB — LIPASE, BLOOD: Lipase: 25 U/L (ref 11–51)

## 2020-05-01 LAB — I-STAT BETA HCG BLOOD, ED (MC, WL, AP ONLY): I-stat hCG, quantitative: <5 m[IU]/mL (ref <5)

## 2020-05-01 LAB — SARS CORONAVIRUS 2 BY RT PCR (HOSPITAL ORDER, PERFORMED IN ~~LOC~~ HOSPITAL LAB): SARS Coronavirus 2: NEGATIVE

## 2020-05-01 MED ORDER — ONDANSETRON 4 MG PO TBDP: 4.0000 mg | ORAL_TABLET | Freq: Three times a day (TID) | ORAL | 0 refills | Status: DC | PRN

## 2020-05-01 MED ORDER — ACETAMINOPHEN 325 MG PO TABS
650.0000 mg | ORAL_TABLET | Freq: Once | ORAL | Status: AC
  Administered 2020-05-01: 650 mg via ORAL
  Filled 2020-05-01: qty 2

## 2020-05-01 MED ORDER — CEPHALEXIN 500 MG PO CAPS: 500.0000 mg | ORAL_CAPSULE | Freq: Three times a day (TID) | ORAL | 0 refills | Status: AC

## 2020-05-01 MED ORDER — DICYCLOMINE HCL 20 MG PO TABS: 20.0000 mg | ORAL_TABLET | Freq: Two times a day (BID) | ORAL | 0 refills | Status: DC

## 2020-05-01 NOTE — ED Notes (Signed)
Beverage provided for fluid challenge. Able to drink with medication admin, encouraged to continue sipping on drink in room.

## 2020-05-01 NOTE — ED Notes (Signed)
Patient verbalizes understanding of discharge instructions. Opportunity for questioning and answers were provided. Armband removed by staff, pt discharged from ED to home via POV  

## 2020-05-01 NOTE — Discharge Instructions (Signed)
You are being treated for suspected viral illness. Please drink plenty of water use Tylenol and ibuprofen as described below. I also recommend following up with primary care doctor. There was some blood in your urine--recommend following up with your primary care doctor to reevaluate this if this is persisting may need to see urology.   Your COVID test is pending; you should expect results in 2-3 days. You can access your results on your MyChart--if you test positive you should receive a phone call.  In the meantime follow CDC guidelines and quarantine, wear a mask, wash hands often.   Please take over the counter vitamin D 2000-4000 units per day. I also recommend zinc 50 mg per day for the next two weeks.   Please return to ED if you feel have difficulty breathing or have emergent, new or concerning symptoms.  Patients who have symptoms consistent with COVID-19 should self isolated for: At least 3 days (72 hours) have passed since recovery, defined as resolution of fever without the use of fever reducing medications and improvement in respiratory symptoms (e.g., cough, shortness of breath), and At least 7 days have passed since symptoms first appeared.       Person Under Monitoring Name: [name]  Location: [address] [place] [address]   Infection Prevention Recommendations for Individuals Confirmed to have, or Being Evaluated for, 2019 Novel Coronavirus (COVID-19) Infection Who Receive Care at Home  Individuals who are confirmed to have, or are being evaluated for, COVID-19 should follow the prevention steps below until a healthcare provider or local or state health department says they can return to normal activities.  Stay home except to get medical care You should restrict activities outside your home, except for getting medical care. Do not go to work, school, or public areas, and do not use public transportation or taxis.  Call ahead before visiting your  doctor Before your medical appointment, call the healthcare provider and tell them that you have, or are being evaluated for, COVID-19 infection. This will help the healthcare providers office take steps to keep other people from getting infected. Ask your healthcare provider to call the local or state health department.  Monitor your symptoms Seek prompt medical attention if your illness is worsening (e.g., difficulty breathing). Before going to your medical appointment, call the healthcare provider and tell them that you have, or are being evaluated for, COVID-19 infection. Ask your healthcare provider to call the local or state health department.  Wear a facemask You should wear a facemask that covers your nose and mouth when you are in the same room with other people and when you visit a healthcare provider. People who live with or visit you should also wear a facemask while they are in the same room with you.  Separate yourself from other people in your home As much as possible, you should stay in a different room from other people in your home. Also, you should use a separate bathroom, if available.  Avoid sharing household items You should not share dishes, drinking glasses, cups, eating utensils, towels, bedding, or other items with other people in your home. After using these items, you should wash them thoroughly with soap and water.  Cover your coughs and sneezes Cover your mouth and nose with a tissue when you cough or sneeze, or you can cough or sneeze into your sleeve. Throw used tissues in a lined trash can, and immediately wash your hands with soap and water for  at least 20 seconds or use an alcohol-based hand rub.  Wash your [employer] your hands often and thoroughly with soap and water for at least 20 seconds. You can use an alcohol-based hand sanitizer if soap and water are not available and if your hands are not visibly dirty. Avoid touching your eyes, nose,  and mouth with unwashed hands.   Prevention Steps for Caregivers and Household Members of Individuals Confirmed to have, or Being Evaluated for, COVID-19 Infection Being Cared for in the Home  If you live with, or provide care at home for, a person confirmed to have, or being evaluated for, COVID-19 infection please follow these guidelines to prevent infection:  Follow healthcare providers instructions Make sure that you understand and can help the patient follow any healthcare provider instructions for all care.  Provide for the patients basic needs You should help the patient with basic needs in the home and provide support for getting groceries, prescriptions, and other personal needs.  Monitor the patients symptoms If they are getting sicker, call his or her medical provider and tell them that the patient has, or is being evaluated for, COVID-19 infection. This will help the healthcare providers office take steps to keep other people from getting infected. Ask the healthcare provider to call the local or state health department.  Limit the number of people who have contact with the patient If possible, have only one caregiver for the patient. Other household members should stay in another home or place of residence. If this is not possible, they should stay in another room, or be separated from the patient as much as possible. Use a separate bathroom, if available. Restrict visitors who do not have an essential need to be in the home.  Keep older adults, very young children, and other sick people away from the patient Keep older adults, very young children, and those who have compromised immune systems or chronic health conditions away from the patient. This includes people with chronic heart, lung, or kidney conditions, diabetes, and cancer.  Ensure good ventilation Make sure that shared spaces in the home have good air flow, such as from an air conditioner or an opened  window, weather permitting.  Wash your hands often Wash your hands often and thoroughly with soap and water for at least 20 seconds. You can use an alcohol based hand sanitizer if soap and water are not available and if your hands are not visibly dirty. Avoid touching your eyes, nose, and mouth with unwashed hands. Use disposable paper towels to dry your hands. If not available, use dedicated cloth towels and replace them when they become wet.  Wear a facemask and gloves Wear a disposable facemask at all times in the room and gloves when you touch or have contact with the patients blood, body fluids, and/or secretions or excretions, such as sweat, saliva, sputum, nasal mucus, vomit, urine, or feces.  Ensure the mask fits over your nose and mouth tightly, and do not touch it during use. Throw out disposable facemasks and gloves after using them. Do not reuse. Wash your hands immediately after removing your facemask and gloves. If your personal clothing becomes contaminated, carefully remove clothing and launder. Wash your hands after handling contaminated clothing. Place all used disposable facemasks, gloves, and other waste in a lined container before disposing them with other household waste. Remove gloves and wash your hands immediately after handling these items.  Do not share dishes, glasses, or other household items with the  patient Avoid sharing household items. You should not share dishes, drinking glasses, cups, eating utensils, towels, bedding, or other items with a patient who is confirmed to have, or being evaluated for, COVID-19 infection. After the person uses these items, you should wash them thoroughly with soap and water.  Wash laundry thoroughly Immediately remove and wash clothes or bedding that have blood, body fluids, and/or secretions or excretions, such as sweat, saliva, sputum, nasal mucus, vomit, urine, or feces, on them. Wear gloves when handling laundry from the  patient. Read and follow directions on labels of laundry or clothing items and detergent. In general, wash and dry with the warmest temperatures recommended on the label.  Clean all areas the individual has used often Clean all touchable surfaces, such as counters, tabletops, doorknobs, bathroom fixtures, toilets, phones, keyboards, tablets, and bedside tables, every day. Also, clean any surfaces that may have blood, body fluids, and/or secretions or excretions on them. Wear gloves when cleaning surfaces the patient has come in contact with. Use a diluted bleach solution (e.g., dilute bleach with 1 part bleach and 10 parts water) or a household disinfectant with a label that says EPA-registered for coronaviruses. To make a bleach solution at home, add 1 tablespoon of bleach to 1 quart (4 cups) of water. For a larger supply, add  cup of bleach to 1 gallon (16 cups) of water. Read labels of cleaning products and follow recommendations provided on product labels. Labels contain instructions for safe and effective use of the cleaning product including precautions you should take when applying the product, such as wearing gloves or eye protection and making sure you have good ventilation during use of the product. Remove gloves and wash hands immediately after cleaning.  Monitor yourself for signs and symptoms of illness Caregivers and household members are considered close contacts, should monitor their health, and will be asked to limit movement outside of the home to the extent possible. Follow the monitoring steps for close contacts listed on the symptom monitoring form.   ? If you have additional questions, contact your local health department or call the epidemiologist on call at [phone] (available 24/7). ? This guidance is subject to change. For the most up-to-date guidance from [facility], please refer to their website: [email or web address]

## 2020-05-01 NOTE — ED Provider Notes (Signed)
[facility] EMERGENCY DEPARTMENT Provider Note   CSN: [id number] Arrival date & time: 05/01/20  1050     History Chief Complaint  Patient presents with  . Shortness of Breath  . Diarrhea    [name] is a 34 y.o. female.  HPI  Patient is a 34 year old female with past medical history significant for hypertension on medication for this, history of anemia and states that she was vaccinated once but has not received her second vaccination yet.  She states for the past 3 days she has had diarrhea which she states has been a little loose stool, 1-2 episodes..  No bloody diarrhea.  She denies any abdominal pain endorses some nausea but denies any vomiting.  She states that she has had some cough some congestion and some chills.  She denies any fevers at home.  Denies any chest pain.  She states she has had fatigue and malaise as well.  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  She denies any history of blood clots is not on any oral contraceptive medication denies any unilateral or bilateral leg swelling edema or calf pain.  Patient initially denied any urinary symptoms however when informed that she had an abnormal urinalysis is contaminated she did endorse some additional symptoms including frequency and urgency and suprapubic cramping. She states that she is not currently on her period.     Past Medical History:  Diagnosis Date  . Anemia   . Chronic hypertension during pregnancy, antepartum 12/24/2017   [x]  Aspirin 81 mg daily after 12 weeks; discontinue after 36 weeks Current antihypertensives:  None   Baseline and surveillance labs (pulled in from [facility], refresh links as needed)  Lab Results Component Value Date  PLT 353 11/25/2016  CREATININE 0.50 11/24/2017  AST 24 11/25/2016  ALT 17 11/25/2016  PROTCRRATIO 0.30 (H) 12/31/2015   Antenatal Testing CHTN - O10.919  Group I  BP < 140/90, no preecl  . Hypertension   .  Pregnancy induced hypertension     Patient Active Problem List   Diagnosis Date Noted  . Hypertension     Past Surgical History:  Procedure Laterality Date  . TUBAL LIGATION Bilateral 07/20/2018   Procedure: POST PARTUM TUBAL LIGATION;  Surgeon: 07/22/2018, DO;  Location: [facility];  Service: Gynecology;  Laterality: Bilateral;     OB History    Gravida  4   Para  4   Term  4   Preterm  0   AB  0   Living  4     SAB  0   TAB  0   Ectopic  0   Multiple  0   Live Births  4           Family History  Problem Relation Age of Onset  . Hypertension Mother     Social History   Tobacco Use  . Smoking status: Former Smoker    Types: Cigarettes    Quit date: 03/29/2013    Years since quitting: 7.0  . Smokeless tobacco: Never Used  Vaping Use  . Vaping Use: Never used  Substance Use Topics  . Alcohol use: Yes  . Drug use: No    Home Medications Prior to Admission medications   Medication Sig Start Date End Date Taking? Authorizing Provider  amLODipine (NORVASC) 10 MG tablet TAKE 1 TABLET(10 MG) BY MOUTH DAILY 10/10/19   Wieters, [place] C, PA-C  cephALEXin (KEFLEX) 500 MG capsule Take 1 capsule (500 mg total) by mouth 3 (three) times daily for 5 days. 05/01/20 05/06/20  Gailen Shelter, PA  dicyclomine (BENTYL) 20 MG tablet Take 1 tablet (20 mg total) by mouth 2 (two) times daily. 05/01/20   Gailen Shelter, PA  ferrous gluconate (FERGON) 324 MG tablet Take 1 tablet (324 mg total) by mouth 2 (two) times daily with a meal. 02/22/18   Stinson, Rhona Raider, DO  ibuprofen (ADVIL,MOTRIN) 600 MG tablet Take 1 tablet (600 mg total) by mouth every 6 (six) hours. 07/21/18   Tamera Stands, DO  ondansetron (ZOFRAN ODT) 4 MG disintegrating tablet Take 1 tablet (4 mg total) by mouth every 8 (eight) hours as needed for nausea or vomiting. 05/01/20   Gailen Shelter, PA  Prenatal Vit-DSS-Fe Fum-FA (PRENATAL 19) tablet Take 1 tablet by mouth daily. 03/22/18   Lesly Dukes, MD    Allergies    Patient has no known allergies.  Review of Systems   Review of Systems  Constitutional: Positive for chills and fatigue. Negative for fever.  HENT: Positive for congestion.   Eyes: Negative for pain.  Respiratory: Positive for cough and shortness of breath.   Cardiovascular: Negative for chest pain and leg swelling.  Gastrointestinal: Positive for nausea. Negative for abdominal pain and vomiting.  Genitourinary: Negative for dysuria.  Musculoskeletal: Negative for myalgias.  Skin: Negative for rash.  Neurological: Negative for dizziness and headaches.    Physical Exam Updated Vital Signs BP (!) 133/97   Pulse 68   Temp 98.1 F (36.7 C) (Oral)   Resp 13   Ht 5\' 2"  (1.575 m)   Wt 93 kg   SpO2 100%   BMI 37.49 kg/m   Physical Exam Vitals and nursing note reviewed.  Constitutional:      General: She is not in acute distress. HENT:     Head: Normocephalic and atraumatic.     Nose: Nose normal.     Mouth/Throat:     Mouth: Mucous membranes are moist.  Eyes:     General: No scleral icterus. Cardiovascular:     Rate and Rhythm: Normal rate and regular rhythm.     Pulses: Normal pulses.     Heart sounds: Normal heart sounds.  Pulmonary:     Effort: Pulmonary effort is normal. No respiratory distress.     Breath sounds: Normal breath sounds. No wheezing.     Comments: Lung clear to auscultation all fields.  No crackles or wheezing.  Normal work of breathing. Abdominal:     Palpations: Abdomen is soft.     Tenderness: There is no abdominal tenderness. There is no right CVA tenderness, left CVA tenderness, guarding or rebound.  Musculoskeletal:     Cervical back: Normal range of motion.     Right lower leg: No edema.     Left lower leg: No edema.  Skin:    General: Skin is warm and dry.     Capillary Refill: Capillary refill takes less than 2 seconds.  Neurological:     Mental Status: She is alert. Mental status is at baseline.    Psychiatric:        Mood and Affect: Mood normal.        Behavior: Behavior normal.     ED Results / Procedures / Treatments   Labs (all labs ordered are listed, but only abnormal results are displayed) Labs Reviewed  COMPREHENSIVE METABOLIC PANEL - Abnormal; Notable  for the following components:      Result Value   CO2 20 (*)    Glucose, Bld 108 (*)    Calcium 8.7 (*)    All other components within normal limits  URINALYSIS, ROUTINE W REFLEX MICROSCOPIC - Abnormal; Notable for the following components:   Color, Urine AMBER (*)    APPearance CLOUDY (*)    Hgb urine dipstick SMALL (*)    Protein, ur 30 (*)    Leukocytes,Ua LARGE (*)    WBC, UA >50 (*)    Bacteria, UA FEW (*)    Squamous Epithelial / LPF >50 (*)    All other components within normal limits  SARS CORONAVIRUS 2 BY RT PCR (HOSPITAL ORDER, PERFORMED IN [facility])  LIPASE, BLOOD  CBC  I-STAT BETA HCG BLOOD, ED ([facility], [facility], [facility] ONLY)    EKG EKG Interpretation  Date/Time:  Tuesday May 01 2020 18:40:04 EDT Ventricular Rate:  67 PR Interval:    QRS Duration: 100 QT Interval:  422 QTC Calculation: 446 R Axis:   34 Text Interpretation: Sinus rhythm Baseline wander in lead(s) V6 Confirmed by Virgina NorfolkAdam, [place] [phone]([id number]) on 05/01/2020 6:45:52 PM   Radiology DG Chest 2 View  Result Date: 05/01/2020 CLINICAL DATA:  Short of breath, diarrhea for several days, lower abdominal pain EXAM: CHEST - 2 VIEW COMPARISON:  08/23/2019 FINDINGS: The heart size and mediastinal contours are within normal limits. Both lungs are clear. The visualized skeletal structures are unremarkable. IMPRESSION: No active cardiopulmonary disease. Electronically Signed   By: Sharlet SalinaMichael  Brown M.D.   On: 05/01/2020 18:24    Procedures Procedures (including critical care time)  Medications Ordered in ED Medications  acetaminophen (TYLENOL) tablet 650 mg (650 mg Oral Given 05/01/20 1840)    ED Course  I have reviewed the triage vital  signs and the nursing notes.  Pertinent labs & imaging results that were available during my care of the patient were reviewed by me and considered in my medical decision making (see chart for details).    MDM Rules/Calculators/A&P                          Patient 34 year old female with past medical history of hypertension presented today with 3 days of symptoms primarily shortness breath, fatigue, nausea and diarrhea.  Physical exam is unremarkable.  She is not tachycardic tachypneic and has clear lungs in all fields.  She is overall well-appearing.  History not consistent with viral illness she is afebrile.  Is not had any symptoms of diarrhea.  Suspect viral gastroenteritis/Covid. Covid swab obtained.  She is discharged prior to results. She was given counseling in case testing is positive.  She will quarantine until she is able to see results.   CMP without any electrolyte abnormality and no transaminitis.  As hCG is negative.  Urinalysis is notable for small hemoglobin and few bacteria leukocytes present.  Also does appear grossly contaminated however patient does endorse symptoms of urinary tract infection therefore will treat empirically after shared decision conversation with patient.  She will follow up with her primary care doctor for recheck and make sure there is no chronic hematuria.  As patient does not have a PCP established she will [facility].  CBC without leukocytosis or anemia.  Lipase within normal limits doubt pancreatitis  Patient denies any chest pain.  Very much doubt that she has a pulmonary embolism and she  is not tachycardic not having hemoptysis no unilateral leg swelling, no hypoxia.  She is PERC negative.  She is verbal discharge at this time with follow-up she is given close return cautions as well.  She is given Tylenol with some improvement in symptoms.  Patient ambulated personally, she did not drop below 98% on room air.  Is  tolerating p.o.  KLAIR LEISING was evaluated in Emergency Department on 05/01/2020 for the symptoms described in the history of present illness. She was evaluated in the context of the global COVID-19 pandemic, which necessitated consideration that the patient might be at risk for infection with the SARS-CoV-2 virus that causes COVID-19. Institutional protocols and algorithms that pertain to the evaluation of patients at risk for COVID-19 are in a state of rapid change based on information released by regulatory bodies including the CDC and federal and state organizations. These policies and algorithms were followed during the patient's care in the ED.  Final Clinical Impression(s) / ED Diagnoses Final diagnoses:  Viral illness  Suspected 2019 novel coronavirus infection  Diarrhea, unspecified type  Acute cystitis with hematuria    Rx / DC Orders ED Discharge Orders         Ordered    ondansetron (ZOFRAN ODT) 4 MG disintegrating tablet  Every 8 hours PRN        05/01/20 1912    dicyclomine (BENTYL) 20 MG tablet  2 times daily        05/01/20 1912    cephALEXin (KEFLEX) 500 MG capsule  3 times daily        05/01/20 1912           Gailen Shelter, [place] 05/01/20 1954    Virgina Norfolk, DO 05/01/20 2117

## 2020-05-01 NOTE — ED Triage Notes (Signed)
Pt endorses SOB and diarrhea for a few days. Endorses mild lower abd pain. Denies sick contacts.

## 2020-05-24 NOTE — Progress Notes (Signed)
Patient ID: LEA BAINE, female   DOB: Mar 15, 1986, 34 y.o.   MRN: [id number]   Virtual Visit via Telephone Note  I connected with Reather Littler Burnham on 05/30/20 at  4:10 PM EDT by telephone and verified that I am speaking with the correct person using two identifiers.   I discussed the limitations, risks, security and privacy concerns of performing an evaluation and management service by telephone and the availability of in person appointments. I also discussed with the patient that there may be a patient responsible charge related to this service. The patient expressed understanding and agreed to proceed.  PATIENT visit by telephone virtually in the context of Covid-19 pandemic.   Patient location:  home My Location:  [facility] office Persons on the call:  Me and the patient    History of Present Illness: After ED visit 05/01/2020 for diarrhea and nausea with dysuria.  Treated with IVF and keflex.  Feeling much better.  No further nausea or diarrhea.  No abdominal pain. No fever.  Appetite is good.  She has been on amlodipine for a while and it causes her to have leg swelling.  It has been this way for a long time but getting worse.  She wants to change meds.  No CP/SOB/HA.    From ED note: HPI  Patient is a 34 year old female with past medical history significant for hypertension on medication for this, history of anemia and states that she was vaccinated once but has not received her second vaccination yet.  She states for the past 3 days she has had diarrhea which she states has been a little loose stool, 1-2 episodes..  No bloody diarrhea.  She denies any abdominal pain endorses some nausea but denies any vomiting.  She states that she has had some cough some congestion and some chills.  She denies any fevers at home.  Denies any chest pain.  She states she has had fatigue and malaise as well.  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  She denies  any history of blood clots is not on any oral contraceptive medication denies any unilateral or bilateral leg swelling edema or calf pain.  Patient initially denied any urinary symptoms however when informed that she had an abnormal urinalysis is contaminated she did endorse some additional symptoms including frequency and urgency and suprapubic cramping. She states that she is not currently on her period.  From A/P: Patient 34 year old female with past medical history of hypertension presented today with 3 days of symptoms primarily shortness breath, fatigue, nausea and diarrhea.  Physical exam is unremarkable.  She is not tachycardic tachypneic and has clear lungs in all fields.  She is overall well-appearing.  History not consistent with viral illness she is afebrile.  Is not had any symptoms of diarrhea.  Suspect viral gastroenteritis/Covid. Covid swab obtained.  She is discharged prior to results. She was given counseling in case testing is positive.  She will quarantine until she is able to see results.   CMP without any electrolyte abnormality and no transaminitis.  As hCG is negative.  Urinalysis is notable for small hemoglobin and few bacteria leukocytes present.  Also does appear grossly contaminated however patient does endorse symptoms of urinary tract infection therefore will treat empirically after shared decision conversation with patient.  She will follow up with her primary care doctor for recheck and make sure there is no chronic hematuria.  As patient does not  have a PCP established she will [facility] up [facility].  CBC without leukocytosis or anemia.  Lipase within normal limits doubt pancreatitis  Patient denies any chest pain.  Very much doubt that she has a pulmonary embolism and she is not tachycardic not having hemoptysis no unilateral leg swelling, no hypoxia.  She is PERC negative.  She is verbal discharge at this time with follow-up she is  given close return cautions as well.  She is given Tylenol with some improvement in symptoms.  Patient ambulated personally, she did not drop below 98% on room air.  Is tolerating p.o.  TMYA WIGINGTON was evaluated in Emergency Department on 05/01/2020 for the symptoms described in the history of present illness. She was evaluated in the context of the global COVID-19 pandemic, which necessitated consideration that the patient might be at risk for infection with the SARS-CoV-2 virus that causes COVID-19. Institutional protocols and algorithms that pertain to the evaluation of patients at risk for COVID-19 are in a state of rapid change based on information released by regulatory bodies including the CDC and federal and state organizations. These policies and algorithms were followed during the patient's care in the ED.    Observations/Objective: NAD.  A&Ox3  Assessment and Plan: 1. Primary hypertension Stop amlodipine.  start - lisinopril-hydrochlorothiazide (ZESTORETIC) 20-12.5 MG tablet; Take 1 tablet by mouth daily.  Dispense: 90 tablet; Refill: 1 Check BP at least 3 times weekly and record and bring to next visit  2. Urinary tract infection with hematuria, site unspecified Resolved s/p keflex.  Drink 80-100 ounces water daily and will recheck urine at f/up for any residual hematuria  3. Encounter for examination following treatment at hospital    Follow Up Instructions: Assign PCP in about 4 weeks   I discussed the assessment and treatment plan with the patient. The patient was provided an opportunity to ask questions and all were answered. The patient agreed with the plan and demonstrated an understanding of the instructions.   The patient was advised to call back or seek an in-person evaluation if the symptoms worsen or if the condition fails to improve as anticipated.  I provided 16 minutes of non-face-to-face time during this encounter.   [place], PA-C

## 2020-05-30 ENCOUNTER — Ambulatory Visit: Payer: Self-pay | Attending: Physician Assistant | Admitting: Physician Assistant

## 2020-05-30 ENCOUNTER — Other Ambulatory Visit: Payer: Self-pay

## 2020-05-30 DIAGNOSIS — N390 Urinary tract infection, site not specified: Secondary | ICD-10-CM

## 2020-05-30 DIAGNOSIS — I10 Essential (primary) hypertension: Secondary | ICD-10-CM

## 2020-05-30 DIAGNOSIS — R319 Hematuria, unspecified: Secondary | ICD-10-CM

## 2020-05-30 DIAGNOSIS — Z09 Encounter for follow-up examination after completed treatment for conditions other than malignant neoplasm: Secondary | ICD-10-CM

## 2020-05-30 MED ORDER — LISINOPRIL-HYDROCHLOROTHIAZIDE 20-12.5 MG PO TABS: 1.0000 | ORAL_TABLET | Freq: Every day | ORAL | 1 refills | Status: DC

## 2020-06-07 ENCOUNTER — Other Ambulatory Visit: Payer: Self-pay

## 2020-06-07 ENCOUNTER — Ambulatory Visit: Payer: Self-pay | Admitting: Family Medicine

## 2020-06-07 ENCOUNTER — Ambulatory Visit: Payer: Self-pay | Attending: Family Medicine | Admitting: Family Medicine

## 2020-06-07 ENCOUNTER — Encounter: Payer: Self-pay | Admitting: Family Medicine

## 2020-06-07 VITALS — BP 144/91 | HR 80 | Temp 97.7°F | Ht 62.5 in | Wt 204.2 lb

## 2020-06-07 DIAGNOSIS — R1013 Epigastric pain: Secondary | ICD-10-CM

## 2020-06-07 DIAGNOSIS — N390 Urinary tract infection, site not specified: Secondary | ICD-10-CM

## 2020-06-07 DIAGNOSIS — R108A3 Suprapubic tenderness: Secondary | ICD-10-CM

## 2020-06-07 DIAGNOSIS — N898 Other specified noninflammatory disorders of vagina: Secondary | ICD-10-CM

## 2020-06-07 DIAGNOSIS — R319 Hematuria, unspecified: Secondary | ICD-10-CM

## 2020-06-07 DIAGNOSIS — R110 Nausea: Secondary | ICD-10-CM

## 2020-06-07 DIAGNOSIS — R10819 Abdominal tenderness, unspecified site: Secondary | ICD-10-CM

## 2020-06-07 LAB — POCT URINALYSIS DIP (CLINITEK)
Bilirubin, UA: NEGATIVE
Glucose, UA: NEGATIVE mg/dL
Ketones, POC UA: NEGATIVE mg/dL
Nitrite, UA: NEGATIVE
POC PROTEIN,UA: NEGATIVE
Spec Grav, UA: 1.02
Urobilinogen, UA: 0.2 U/dL
pH, UA: 7

## 2020-06-07 MED ORDER — SULFAMETHOXAZOLE-TRIMETHOPRIM 800-160 MG PO TABS: 1.0000 | ORAL_TABLET | Freq: Two times a day (BID) | ORAL | 0 refills | Status: AC

## 2020-06-07 MED ORDER — ONDANSETRON 4 MG PO TBDP: 4.0000 mg | ORAL_TABLET | Freq: Three times a day (TID) | ORAL | 0 refills | Status: DC | PRN

## 2020-06-07 MED ORDER — OMEPRAZOLE 20 MG PO CPDR: 20.0000 mg | DELAYED_RELEASE_CAPSULE | Freq: Every day | ORAL | 3 refills | Status: DC

## 2020-06-07 NOTE — Progress Notes (Signed)
No odor/cramping abdominal pain

## 2020-06-07 NOTE — Patient Instructions (Signed)
Urinary Tract Infection, Adult A urinary tract infection (UTI) is an infection of any part of the urinary tract. The urinary tract includes:  The kidneys.  The ureters.  The bladder.  The urethra. These organs make, store, and get rid of pee (urine) in the body. What are the causes? This is caused by germs (bacteria) in your genital area. These germs grow and cause swelling (inflammation) of your urinary tract. What increases the risk? You are more likely to develop this condition if:  You have a small, thin tube (catheter) to drain pee.  You cannot control when you pee or poop (incontinence).  You are female, and: ? You use these methods to prevent pregnancy:  A medicine that kills sperm (spermicide).  A device that blocks sperm (diaphragm). ? You have low levels of a female hormone (estrogen). ? You are pregnant.  You have genes that add to your risk.  You are sexually active.  You take antibiotic medicines.  You have trouble peeing because of: ? A prostate that is bigger than normal, if you are female. ? A blockage in the part of your body that drains pee from the bladder (urethra). ? A kidney stone. ? A nerve condition that affects your bladder (neurogenic bladder). ? Not getting enough to drink. ? Not peeing often enough.  You have other conditions, such as: ? Diabetes. ? A weak disease-fighting system (immune system). ? Sickle cell disease. ? Gout. ? Injury of the spine. What are the signs or symptoms? Symptoms of this condition include:  Needing to pee right away (urgently).  Peeing often.  Peeing small amounts often.  Pain or burning when peeing.  Blood in the pee.  Pee that smells bad or not like normal.  Trouble peeing.  Pee that is cloudy.  Fluid coming from the vagina, if you are female.  Pain in the belly or lower back. Other symptoms include:  Throwing up (vomiting).  No urge to eat.  Feeling mixed up (confused).  Being tired  and grouchy (irritable).  A fever.  Watery poop (diarrhea). How is this treated? This condition may be treated with:  Antibiotic medicine.  Other medicines.  Drinking enough water. Follow these instructions at home:  Medicines  Take over-the-counter and prescription medicines only as told by your doctor.  If you were prescribed an antibiotic medicine, take it as told by your doctor. Do not stop taking it even if you start to feel better. General instructions  Make sure you: ? Pee until your bladder is empty. ? Do not hold pee for a long time. ? Empty your bladder after sex. ? Wipe from front to back after pooping if you are a female. Use each tissue one time when you wipe.  Drink enough fluid to keep your pee pale yellow.  Keep all follow-up visits as told by your doctor. This is important. Contact a doctor if:  You do not get better after 1-2 days.  Your symptoms go away and then come back. Get help right away if:  You have very bad back pain.  You have very bad pain in your lower belly.  You have a fever.  You are sick to your stomach (nauseous).  You are throwing up. Summary  A urinary tract infection (UTI) is an infection of any part of the urinary tract.  This condition is caused by germs in your genital area.  There are many risk factors for a UTI. These include having a small, thin   tube to drain pee and not being able to control when you pee or poop.  Treatment includes antibiotic medicines for germs.  Drink enough fluid to keep your pee pale yellow. This information is not intended to replace advice given to you by your health care provider. Make sure you discuss any questions you have with your health care provider. Document Revised: 07/29/2018 Document Reviewed: 02/18/2018 Elsevier Patient Education  2020 [employer]  

## 2020-06-07 NOTE — Progress Notes (Signed)
Established Patient Office Visit  Subjective:  Patient ID: [name], female    DOB: March 29, 1986  Age: 34 y.o. MRN: [id number]  CC:  Chief Complaint  Patient presents with  . Vaginal Discharge    HPI [name], 34 yo female who is seen due to complaints of continued issues with urinary frequency and urgency similar to when she was seen in the ED in early September and she was diagnosed with a urinary tract infection and treated with keflex but she does not believe that this helped. She also reports that her menses stopped two weeks ago and she now has some yellowish vaginal discharge which was also present at her ED visit. She has had some lower abdominal discomfort, no pelvic pain. No fever or chills. Occasional back pain but in the lower mid back. She does have recurrent issues with nausea and would like a refill of zofran. She does not take any medication for acid reflux. She does upon questioning have some burping, belching and occasional backwash of bad tasting fluid in her throat/mouth. She did have some issues with swelling in her ankles but reports that her at her recent telemedicine visit on 05/30/2020 she told the provider about the swelling and her amlodipine was changed to lisinopril-hctz but she is not sure if this is controlling her blood pressure.   Past Medical History:  Diagnosis Date  . Anemia   . Chronic hypertension during pregnancy, antepartum 12/24/2017   [x]  Aspirin 81 mg daily after 12 weeks; discontinue after 36 weeks Current antihypertensives:  None   Baseline and surveillance labs (pulled in from [facility], refresh links as needed)  Lab Results Component Value Date  PLT 353 11/25/2016  CREATININE 0.50 11/24/2017  AST 24 11/25/2016  ALT 17 11/25/2016  PROTCRRATIO 0.30 (H) 12/31/2015   Antenatal Testing CHTN - O10.919  Group I  BP < 140/90, no preecl  . Hypertension   . Pregnancy induced hypertension     Past Surgical History:  Procedure Laterality Date  . TUBAL  LIGATION Bilateral 07/20/2018   Procedure: POST PARTUM TUBAL LIGATION;  Surgeon: 07/22/2018, DO;  Location: [facility];  Service: Gynecology;  Laterality: Bilateral;    Family History  Problem Relation Age of Onset  . Hypertension Mother     Social History   Socioeconomic History  . Marital status: Single    Spouse name: Not on file  . Number of children: Not on file  . Years of education: Not on file  . Highest education level: Not on file  Occupational History  . Not on file  Tobacco Use  . Smoking status: Former Smoker    Types: Cigarettes    Quit date: 03/29/2013    Years since quitting: 7.1  . Smokeless tobacco: Never Used  Vaping Use  . Vaping Use: Never used  Substance and Sexual Activity  . Alcohol use: Yes  . Drug use: No  . Sexual activity: Yes    Birth control/protection: None  Other Topics Concern  . Not on file  Social History Narrative  . Not on file   Social Determinants of Health   Financial Resource Strain:   . Difficulty of Paying Living Expenses: Not on file  Food Insecurity:   . Worried About 05/29/2013 in the Last Year: Not on file  . Ran Out of Food in the Last Year: Not on file  Transportation Needs:   . Lack of Transportation (Medical): Not on  file  . Lack of Transportation (Non-Medical): Not on file  Physical Activity:   . Days of Exercise per Week: Not on file  . Minutes of Exercise per Session: Not on file  Stress:   . Feeling of Stress : Not on file  Social Connections:   . Frequency of Communication with Friends and Family: Not on file  . Frequency of Social Gatherings with Friends and Family: Not on file  . Attends Religious Services: Not on file  . Active Member of Clubs or Organizations: Not on file  . Attends Banker Meetings: Not on file  . Marital Status: Not on file  Intimate Partner Violence:   . Fear of Current or Ex-Partner: Not on file  . Emotionally Abused: Not on file  .  Physically Abused: Not on file  . Sexually Abused: Not on file    Outpatient Medications Prior to Visit  Medication Sig Dispense Refill  . ferrous gluconate (FERGON) 324 MG tablet Take 1 tablet (324 mg total) by mouth 2 (two) times daily with a meal. 60 tablet 3  . ibuprofen (ADVIL,MOTRIN) 600 MG tablet Take 1 tablet (600 mg total) by mouth every 6 (six) hours. 30 tablet 0  . lisinopril-hydrochlorothiazide (ZESTORETIC) 20-12.5 MG tablet Take 1 tablet by mouth daily. 90 tablet 1  . dicyclomine (BENTYL) 20 MG tablet Take 1 tablet (20 mg total) by mouth 2 (two) times daily. (Patient not taking: Reported on 06/07/2020) 20 tablet 0  . ondansetron (ZOFRAN ODT) 4 MG disintegrating tablet Take 1 tablet (4 mg total) by mouth every 8 (eight) hours as needed for nausea or vomiting. (Patient not taking: Reported on 06/07/2020) 20 tablet 0  . Prenatal Vit-DSS-Fe Fum-FA (PRENATAL 19) tablet Take 1 tablet by mouth daily. (Patient not taking: Reported on 06/07/2020) 30 tablet 11   No facility-administered medications prior to visit.    No Known Allergies  ROS Review of Systems  Constitutional: Negative for chills and fever.  HENT: Negative for sore throat and trouble swallowing.   Respiratory: Negative for cough and shortness of breath.   Cardiovascular: Negative for chest pain and palpitations.  Gastrointestinal: Positive for abdominal pain and nausea. Negative for constipation and diarrhea.  Endocrine: Positive for polyuria. Negative for polydipsia and polyphagia.  Genitourinary: Positive for dysuria and frequency. Negative for flank pain and hematuria.  Musculoskeletal: Positive for back pain. Negative for arthralgias.  Neurological: Negative for dizziness and headaches.  Hematological: Negative for adenopathy. Does not bruise/bleed easily.      Objective:    Physical Exam Constitutional:      General: She is not in acute distress.    Appearance: Normal appearance.  Cardiovascular:      Rate and Rhythm: Normal rate and regular rhythm.  Pulmonary:     Effort: Pulmonary effort is normal.     Breath sounds: Normal breath sounds.  Abdominal:     Palpations: Abdomen is soft.     Tenderness: There is abdominal tenderness. There is no right CVA tenderness, left CVA tenderness, guarding or rebound.     Comments: Mild epigastric and suprapubic tenderness  Musculoskeletal:        General: No tenderness.     Right lower leg: No edema.     Left lower leg: No edema.     Comments: Puffiness at the right ankle but this appears to be a fatty deposit and not actual edema  Skin:    General: Skin is warm and dry.  Neurological:  General: No focal deficit present.     Mental Status: She is alert and oriented to person, place, and time.  Psychiatric:        Mood and Affect: Mood normal.        Behavior: Behavior normal.     BP (!) 144/91 (BP Location: Left Arm, Patient Position: Sitting)   Pulse 80   Temp 97.7 F (36.5 C)   Ht 5' 2.5" (1.588 m)   Wt 204 lb 3.2 oz (92.6 kg)   SpO2 96%   BMI 36.75 kg/m  Wt Readings from Last 3 Encounters:  06/07/20 204 lb 3.2 oz (92.6 kg)  05/01/20 205 lb (93 kg)  10/13/19 205 lb (93 kg)     Health Maintenance Due  Topic Date Due  . Hepatitis C Screening  Never done  . COVID-19 Vaccine (1) Never done  . INFLUENZA VACCINE  Never done   Patient was offered but declined influenza immunization  Lab Results  Component Value Date   TSH 0.239 (L) 01/25/2018   Lab Results  Component Value Date   WBC 7.4 05/01/2020   HGB 12.1 05/01/2020   HCT 36.2 05/01/2020   MCV 88.9 05/01/2020   PLT 378 05/01/2020   Lab Results  Component Value Date   NA 138 05/01/2020   K 3.7 05/01/2020   CO2 20 (L) 05/01/2020   GLUCOSE 108 (H) 05/01/2020   BUN 8 05/01/2020   CREATININE 0.60 05/01/2020   BILITOT 0.8 05/01/2020   ALKPHOS 43 05/01/2020   AST 20 05/01/2020   ALT 11 05/01/2020   PROT 6.8 05/01/2020   ALBUMIN 3.7 05/01/2020   CALCIUM 8.7  (L) 05/01/2020   ANIONGAP 10 05/01/2020   No results found for: CHOL No results found for: HDL No results found for: LDLCALC No results found for: TRIG No results found for: CHOLHDL No results found for: BTDV7O    Assessment & Plan:  1. Urinary tract infection with hematuria, site unspecified Urinalysis abnormal with small leukocytes and trace intact blood.  Urine will be sent for culture as patient reports that she feels as if urinary tract infection treatment from last visit did not work.  While urine culture is pending, prescription provided for Bactrim DS twice daily x3 days.  She is also encouraged to increase water intake.  Educational material on urinary tract infections provided as part of after visit summary. - POCT URINALYSIS DIP (CLINITEK) - sulfamethoxazole-trimethoprim (BACTRIM DS) 800-160 MG tablet; Take 1 tablet by mouth 2 (two) times daily for 3 days.  Dispense: 6 tablet; Refill: 0 - Urine Culture  2. Epigastric pain Patient with complaint of having some epigastric discomfort and acid reflux symptoms.  Prescription provided for omeprazole to take daily as well as avoidance of late night eating and avoidance of known trigger foods/spicy or greasy foods. - omeprazole (PRILOSEC) 20 MG capsule; Take 1 capsule (20 mg total) by mouth daily. To reduce stomach acid  Dispense: 30 capsule; Refill: 3  3. Suprapubic tenderness Urinalysis and follow-up of patient's suprapubic discomfort on examination as patient feels that she may have a urinary tract infection.  Cervicovaginal ancillary testing also done and patient will be notified if any further treatment is needed based on these results. - POCT URINALYSIS DIP (CLINITEK)  4. Nausea Due to patient's complaint of nausea, urinalysis done to look for urinary tract infection as patient feels that she may have recurrent UTI.  She is also being started on omeprazole 20 mg daily to help reduce  stomach acid and refill provided of Zofran per  patient request. - POCT URINALYSIS DIP (CLINITEK) - omeprazole (PRILOSEC) 20 MG capsule; Take 1 capsule (20 mg total) by mouth daily. To reduce stomach acid  Dispense: 30 capsule; Refill: 3 - ondansetron (ZOFRAN ODT) 4 MG disintegrating tablet; Take 1 tablet (4 mg total) by mouth every 8 (eight) hours as needed for nausea or vomiting.  Dispense: 20 tablet; Refill: 0  5. Vaginal discharge Patient with complaint of vaginal discharge similar to vaginal discharge that was occurring at the time of her ED visit on 05/01/2020.  Patient did have abnormal urinalysis at that time with large leukocytes but per ED note, UA was likely contaminated.  No cervicovaginal ancillary testing or pelvic exam/wet prep was done on review of ED notes.  Patient performed self swab for cervicovaginal ancillary test and she will be notified of the results and if any further treatment is needed based on these results. - Cervicovaginal ancillary only    Follow-up: Return in about 1 week (around 06/14/2020) for if continued urinary symptoms; otherwise as needed.   Cain Saupeammie Shantera Monts, MD

## 2020-06-09 LAB — URINE CULTURE

## 2020-06-11 ENCOUNTER — Telehealth: Payer: Self-pay

## 2020-06-11 LAB — CERVICOVAGINAL ANCILLARY ONLY
Bacterial Vaginitis (gardnerella): NEGATIVE
Candida Glabrata: NEGATIVE
Candida Vaginitis: POSITIVE — AB
Chlamydia: NEGATIVE
Comment: NEGATIVE
Comment: NEGATIVE
Comment: NEGATIVE
Comment: NEGATIVE
Comment: NEGATIVE
Comment: NORMAL
Neisseria Gonorrhea: NEGATIVE
Trichomonas: POSITIVE — AB

## 2020-06-11 NOTE — Telephone Encounter (Signed)
Copied from CRM [id number]. Topic: General - Call Back - No Documentation >> Jun 11, 2020  4:57 PM Randol Kern wrote: Reason for CRM: Pt would like a call back from PCP regarding test result the pt saw on MyChart. Wants to know if PCP will prescribe, please advise. Pt declined appt   Best contact: [phone]

## 2020-06-12 ENCOUNTER — Encounter: Payer: Self-pay | Admitting: Family Medicine

## 2020-06-12 ENCOUNTER — Telehealth: Payer: Self-pay | Admitting: Family Medicine

## 2020-06-12 NOTE — Telephone Encounter (Signed)
Pt called to speak with nurse about her med, please follow up

## 2020-06-12 NOTE — Telephone Encounter (Signed)
Pt put through to triage, wishing to speak to nurse. States sent several messages and attempts to call practice regarding symptoms. States no change in vaginal discharge/cramping. States saw results in MyChart,questioning treatment, if med can be prescribed. States finished course of bactrim DS.  Pt expresses frustration, concern about results.Assured NT would route to practice for PCPs review. Please advise: [phone]

## 2020-06-13 ENCOUNTER — Telehealth: Payer: Self-pay

## 2020-06-13 ENCOUNTER — Other Ambulatory Visit: Payer: Self-pay | Admitting: Family Medicine

## 2020-06-13 DIAGNOSIS — A599 Trichomoniasis, unspecified: Secondary | ICD-10-CM

## 2020-06-13 DIAGNOSIS — B373 Candidiasis of vulva and vagina: Secondary | ICD-10-CM

## 2020-06-13 DIAGNOSIS — B3731 Acute candidiasis of vulva and vagina: Secondary | ICD-10-CM

## 2020-06-13 MED ORDER — FLUCONAZOLE 150 MG PO TABS: 150.0000 mg | ORAL_TABLET | Freq: Once | ORAL | 0 refills | Status: AC

## 2020-06-13 MED ORDER — METRONIDAZOLE 500 MG PO TABS: 500.0000 mg | ORAL_TABLET | Freq: Two times a day (BID) | ORAL | 0 refills | Status: AC

## 2020-06-13 NOTE — Telephone Encounter (Signed)
Pt aware of results and voiced understanding, all questions answered/concerns addressed. Pt advised that Rx have been sent into pharmacy.

## 2020-06-13 NOTE — Telephone Encounter (Signed)
Pt aware of send meds to pharmacy .see notes in Epic

## 2020-06-14 NOTE — Telephone Encounter (Signed)
Medication has been prescribed and results were reviewed and sent to CMA yesterday to contact patient

## 2020-06-14 NOTE — Telephone Encounter (Signed)
This encounter was created in error - please disregard.

## 2020-06-28 ENCOUNTER — Ambulatory Visit (HOSPITAL_COMMUNITY)
Admission: EM | Admit: 2020-06-28 | Discharge: 2020-06-28 | Disposition: A | Discharge status: Home / Self Care | Payer: Self-pay | Attending: Family Medicine | Admitting: Family Medicine

## 2020-06-28 ENCOUNTER — Other Ambulatory Visit: Payer: Self-pay

## 2020-06-28 ENCOUNTER — Encounter (HOSPITAL_COMMUNITY): Payer: Self-pay

## 2020-06-28 DIAGNOSIS — S3992XA Unspecified injury of lower back, initial encounter: Secondary | ICD-10-CM

## 2020-06-28 DIAGNOSIS — S161XXA Strain of muscle, fascia and tendon at neck level, initial encounter: Secondary | ICD-10-CM

## 2020-06-28 MED ORDER — CYCLOBENZAPRINE HCL 10 MG PO TABS: 10.0000 mg | ORAL_TABLET | Freq: Two times a day (BID) | ORAL | 0 refills | Status: DC | PRN

## 2020-06-28 MED ORDER — IBUPROFEN 800 MG PO TABS
ORAL_TABLET | ORAL | Status: AC
  Filled 2020-06-28: qty 1

## 2020-06-28 MED ORDER — IBUPROFEN 800 MG PO TABS
800.0000 mg | ORAL_TABLET | Freq: Once | ORAL | Status: AC
  Administered 2020-06-28: 800 mg via ORAL

## 2020-06-28 MED ORDER — DICLOFENAC SODIUM 50 MG PO TBEC
50.0000 mg | DELAYED_RELEASE_TABLET | Freq: Two times a day (BID) | ORAL | 0 refills | Status: DC
Start: 2020-06-28 — End: 2021-01-22
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
## 2020-11-23 ENCOUNTER — Other Ambulatory Visit: Payer: Self-pay | Admitting: Physician Assistant

## 2020-11-23 DIAGNOSIS — I10 Essential (primary) hypertension: Secondary | ICD-10-CM

## 2020-11-23 NOTE — Telephone Encounter (Signed)
No future appt at this time.  

## 2021-01-22 ENCOUNTER — Encounter (HOSPITAL_COMMUNITY): Payer: Self-pay | Admitting: *Deleted

## 2021-01-22 ENCOUNTER — Other Ambulatory Visit: Payer: Self-pay

## 2021-01-22 ENCOUNTER — Ambulatory Visit (HOSPITAL_COMMUNITY)
Admission: EM | Admit: 2021-01-22 | Discharge: 2021-01-22 | Disposition: A | Discharge status: Home / Self Care | Payer: Self-pay | Attending: Medical Oncology | Admitting: Medical Oncology

## 2021-01-22 DIAGNOSIS — S39012A Strain of muscle, fascia and tendon of lower back, initial encounter: Secondary | ICD-10-CM

## 2021-01-22 DIAGNOSIS — S161XXA Strain of muscle, fascia and tendon at neck level, initial encounter: Secondary | ICD-10-CM

## 2021-01-22 MED ORDER — CYCLOBENZAPRINE HCL 10 MG PO TABS: 10.0000 mg | ORAL_TABLET | Freq: Two times a day (BID) | ORAL | 0 refills | Status: DC | PRN

## 2021-01-22 MED ORDER — NAPROXEN 500 MG PO TABS: 500.0000 mg | ORAL_TABLET | Freq: Two times a day (BID) | ORAL | 0 refills | Status: DC
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
## 2021-02-20 ENCOUNTER — Other Ambulatory Visit: Payer: Self-pay | Admitting: Physician Assistant

## 2021-02-20 DIAGNOSIS — I10 Essential (primary) hypertension: Secondary | ICD-10-CM

## 2021-02-20 NOTE — Telephone Encounter (Signed)
Requested medication (s) are due for refill today: yes   Requested medication (s) are on the active medication list: yes   Last refill:  11/23/2020  Future visit scheduled: no  Notes to clinic:  overdue for follow up  Message has been sent for patient to contact office    Requested Prescriptions  Pending Prescriptions Disp Refills   lisinopril-hydrochlorothiazide (ZESTORETIC) 20-12.5 MG tablet [Pharmacy Med Name: LISINOPRIL-HCTZ 20/12.5MG  TABLETS] 90 tablet 0    Sig: TAKE 1 TABLET BY MOUTH DAILY      Cardiovascular:  ACEI + Diuretic Combos Failed - 02/20/2021  3:24 AM      Failed - Na in normal range and within 180 days    Sodium  Date Value Ref Range Status  05/01/2020 138 135 - 145 mmol/L Final  12/24/2017 139 134 - 144 mmol/L Final          Failed - K in normal range and within 180 days    Potassium  Date Value Ref Range Status  05/01/2020 3.7 3.5 - 5.1 mmol/L Final          Failed - Cr in normal range and within 180 days    Creatinine, Ser  Date Value Ref Range Status  05/01/2020 0.60 0.44 - 1.00 mg/dL Final   Creatinine, Urine  Date Value Ref Range Status  07/19/2018 30.00 mg/dL Final          Failed - Ca in normal range and within 180 days    Calcium  Date Value Ref Range Status  05/01/2020 8.7 (L) 8.9 - 10.3 mg/dL Final   Calcium, Ion  Date Value Ref Range Status  11/24/2017 1.07 (L) 1.15 - 1.40 mmol/L Final          Failed - Valid encounter within last 6 months    Recent Outpatient Visits           8 months ago Urinary tract infection with hematuria, site unspecified   [facility] [place], [place], [place]   8 months ago Primary hypertension   [facility] [place], [place], [place] - Patient is not pregnant      Passed - Last BP in normal range    BP Readings from Last 1 Encounters:  01/22/21 (!) 121/55

## 2021-03-04 ENCOUNTER — Emergency Department (HOSPITAL_COMMUNITY)
Admission: EM | Admit: 2021-03-04 | Discharge: 2021-03-05 | Disposition: A | Discharge status: Home / Self Care | Payer: Medicaid Other | Attending: Emergency Medicine | Admitting: Emergency Medicine

## 2021-03-04 ENCOUNTER — Emergency Department (HOSPITAL_COMMUNITY): Payer: Medicaid Other

## 2021-03-04 DIAGNOSIS — R0602 Shortness of breath: Secondary | ICD-10-CM | POA: Insufficient documentation

## 2021-03-04 DIAGNOSIS — Z87891 Personal history of nicotine dependence: Secondary | ICD-10-CM | POA: Diagnosis not present

## 2021-03-04 DIAGNOSIS — R42 Dizziness and giddiness: Secondary | ICD-10-CM | POA: Diagnosis not present

## 2021-03-04 DIAGNOSIS — Z20822 Contact with and (suspected) exposure to covid-19: Secondary | ICD-10-CM | POA: Diagnosis not present

## 2021-03-04 DIAGNOSIS — I10 Essential (primary) hypertension: Secondary | ICD-10-CM | POA: Diagnosis not present

## 2021-03-04 DIAGNOSIS — Z79899 Other long term (current) drug therapy: Secondary | ICD-10-CM | POA: Insufficient documentation

## 2021-03-04 DIAGNOSIS — R0789 Other chest pain: Secondary | ICD-10-CM | POA: Diagnosis not present

## 2021-03-04 DIAGNOSIS — H5500 Unspecified nystagmus: Secondary | ICD-10-CM | POA: Insufficient documentation

## 2021-03-04 DIAGNOSIS — R079 Chest pain, unspecified: Secondary | ICD-10-CM

## 2021-03-04 LAB — COMPREHENSIVE METABOLIC PANEL
ALT: 16 U/L (ref 0–44)
AST: 20 U/L (ref 15–41)
Albumin: 3.5 g/dL (ref 3.5–5.0)
Alkaline Phosphatase: 37 U/L — ABNORMAL LOW (ref 38–126)
Anion gap: 8 (ref 5–15)
BUN: 8 mg/dL (ref 6–20)
CO2: 25 mmol/L (ref 22–32)
Calcium: 8.7 mg/dL — ABNORMAL LOW (ref 8.9–10.3)
Chloride: 103 mmol/L (ref 98–111)
Creatinine, Ser: 0.69 mg/dL (ref 0.44–1.00)
GFR, Estimated: >60 mL/min (ref >60)
Glucose, Bld: 98 mg/dL (ref 70–99)
Potassium: 3.3 mmol/L — ABNORMAL LOW (ref 3.5–5.1)
Sodium: 136 mmol/L (ref 135–145)
Total Bilirubin: 0.8 mg/dL (ref 0.3–1.2)
Total Protein: 6.6 g/dL (ref 6.5–8.1)

## 2021-03-04 LAB — CBC WITH DIFFERENTIAL/PLATELET
Abs Immature Granulocytes: 0.02 10*3/uL (ref 0.00–0.07)
Basophils Absolute: 0 10*3/uL (ref 0.0–0.1)
Basophils Relative: 0 %
Eosinophils Absolute: 0.1 10*3/uL (ref 0.0–0.5)
Eosinophils Relative: 1 %
HCT: 35 % — ABNORMAL LOW (ref 36.0–46.0)
Hemoglobin: 11.5 g/dL — ABNORMAL LOW (ref 12.0–15.0)
Immature Granulocytes: 0 %
Lymphocytes Relative: 37 %
Lymphs Abs: 2.4 10*3/uL (ref 0.7–4.0)
MCH: 30.1 pg (ref 26.0–34.0)
MCHC: 32.9 g/dL (ref 30.0–36.0)
MCV: 91.6 fL (ref 80.0–100.0)
Monocytes Absolute: 0.3 10*3/uL (ref 0.1–1.0)
Monocytes Relative: 5 %
Neutro Abs: 3.6 10*3/uL (ref 1.7–7.7)
Neutrophils Relative %: 57 %
Platelets: 335 10*3/uL (ref 150–400)
RBC: 3.82 MIL/uL — ABNORMAL LOW (ref 3.87–5.11)
RDW: 13.4 % (ref 11.5–15.5)
WBC: 6.4 10*3/uL (ref 4.0–10.5)
nRBC: 0 % (ref 0.0–0.2)

## 2021-03-04 LAB — TROPONIN I (HIGH SENSITIVITY)
Troponin I (High Sensitivity): <2 ng/L (ref <18)
Troponin I (High Sensitivity): 3 ng/L (ref <18)

## 2021-03-04 LAB — I-STAT BETA HCG BLOOD, ED (MC, WL, AP ONLY): I-stat hCG, quantitative: <5 m[IU]/mL (ref <5)

## 2021-03-04 MED ORDER — MECLIZINE HCL 25 MG PO TABS: 25.0000 mg | ORAL_TABLET | Freq: Once | ORAL | Status: DC

## 2021-03-04 NOTE — ED Provider Notes (Signed)
Emergency Medicine Provider Triage Evaluation Note  [name] , a 35 y.o. female  was evaluated in triage.  Pt complains of dizziness.  States her sx started this morning as she was getting out of bed. Describes it as the room spinning. Reports associated chest tightness and SOB that has been constant since the onset of her dizziness. No history of similar sx. No n/v, numbness, weakness, or visual changes.   Physical Exam  BP (!) 135/97 (BP Location: Right Arm)   Pulse 77   Temp 98.6 F (37 C) (Oral)   Resp 16   SpO2 100%  Gen:   Awake, no distress   Resp:  Normal effort  MSK:   Moves extremities without difficulty  Other:  Middle ear effusion noted bilaterally.  Medical Decision Making  Medically screening exam initiated at 3:31 PM.  Appropriate orders placed.  [name] was informed that the remainder of the evaluation will be completed by another provider, this initial triage assessment does not replace that evaluation, and the importance of remaining in the ED until their evaluation is complete.    Placido Sou, PA-C 03/04/21 1532    Gerhard Munch, MD 03/08/21 0930

## 2021-03-04 NOTE — ED Triage Notes (Signed)
Pt c/o dizziness, chest tightness & SHOB x4hrs. 6/10 tightness, unable to get full breath. Pt speaking in full sentences in triage Hx HTN, compliant w medications

## 2021-03-05 LAB — RESP PANEL BY RT-PCR (FLU A&B, COVID) ARPGX2
Influenza A by PCR: NEGATIVE
Influenza B by PCR: NEGATIVE
SARS Coronavirus 2 by RT PCR: NEGATIVE

## 2021-03-05 MED ORDER — MECLIZINE HCL 25 MG PO TABS
25.0000 mg | ORAL_TABLET | Freq: Once | ORAL | Status: AC
  Administered 2021-03-05: 25 mg via ORAL
  Filled 2021-03-05: qty 1

## 2021-03-05 MED ORDER — MECLIZINE HCL 25 MG PO TABS: 25.0000 mg | ORAL_TABLET | Freq: Three times a day (TID) | ORAL | 0 refills | Status: DC | PRN

## 2021-03-05 NOTE — ED Provider Notes (Signed)
[facility] EMERGENCY DEPARTMENT Provider Note   CSN: [id number] Arrival date & time: 03/04/21  1511     History Chief Complaint  Patient presents with   Chest Pain   Shortness of Breath   Dizziness    [name] is a 35 y.o. female.  Patient presents to the emergency department with a chief complaint of dizziness.  She states that she awoke and felt like the room was spinning.  She also reports having some chest tightness and shortness of breath that has been constant throughout the day today.  She denies any fever, or cough.  She denies nausea or vomiting.  She states that she still feels dizzy now.  She denies any numbness, weakness, or tingling.  Denies any treatments prior to arrival.  Meclizine ordered in triage, but has yet to be given.  The history is provided by the patient. No language interpreter was used.      Past Medical History:  Diagnosis Date   Anemia    Chronic hypertension during pregnancy, antepartum 12/24/2017   [x]  Aspirin 81 mg daily after 12 weeks; discontinue after 36 weeks Current antihypertensives:  None   Baseline and surveillance labs (pulled in from [facility], refresh links as needed)  Lab Results Component Value Date  PLT 353 11/25/2016  CREATININE 0.50 11/24/2017  AST 24 11/25/2016  ALT 17 11/25/2016  PROTCRRATIO 0.30 (H) 12/31/2015   Antenatal Testing CHTN - O10.919  Group I  BP < 140/90, no preecl   Hypertension    Pregnancy induced hypertension     Patient Active Problem List   Diagnosis Date Noted   Hypertension     Past Surgical History:  Procedure Laterality Date   TUBAL LIGATION Bilateral 07/20/2018   Procedure: POST PARTUM TUBAL LIGATION;  Surgeon: 07/22/2018, DO;  [facility];  Service: Gynecology;  Laterality: Bilateral;     OB History     Gravida  4   Para  4   Term  4   Preterm  0   AB  0   Living  4      SAB  0   IAB  0   Ectopic  0   Multiple  0   Live Births  4            Family History  Problem Relation Age of Onset   Hypertension Mother     Social History   Tobacco Use   Smoking status: Former    Pack years: 0.00    Types: Cigarettes    Quit date: 03/29/2013    Years since quitting: 7.9   Smokeless tobacco: Never  Vaping Use   Vaping Use: Never used  Substance Use Topics   Alcohol use: Yes   Drug use: No    Home Medications Prior to Admission medications   Medication Sig Start Date End Date Taking? Authorizing Provider  cyclobenzaprine (FLEXERIL) 10 MG tablet Take 1 tablet (10 mg total) by mouth 2 (two) times daily as needed for muscle spasms. 01/22/21   01/24/21, PA-C  dicyclomine (BENTYL) 20 MG tablet Take 1 tablet (20 mg total) by mouth 2 (two) times daily. Patient not taking: Reported on 06/07/2020 05/01/20   07/01/20, PA  ferrous gluconate (FERGON) 324 MG tablet Take 1 tablet (324 mg total) by mouth 2 (two) times daily with a meal. 02/22/18   04/25/18, DO  lisinopril-hydrochlorothiazide (ZESTORETIC) 20-12.5 MG tablet TAKE 1 TABLET  BY MOUTH DAILY 11/23/20   Anders Simmonds, PA-C  naproxen (NAPROSYN) 500 MG tablet Take 1 tablet (500 mg total) by mouth 2 (two) times daily. 01/22/21   Rushie Chestnut, PA-C  omeprazole (PRILOSEC) 20 MG capsule Take 1 capsule (20 mg total) by mouth daily. To reduce stomach acid 06/07/20   Fulp, Cammie, MD  ondansetron (ZOFRAN ODT) 4 MG disintegrating tablet Take 1 tablet (4 mg total) by mouth every 8 (eight) hours as needed for nausea or vomiting. 06/07/20   Fulp, Cammie, MD  Prenatal Vit-DSS-Fe Fum-FA (PRENATAL 19) tablet Take 1 tablet by mouth daily. Patient not taking: Reported on 06/07/2020 03/22/18   Lesly Dukes, MD    Allergies    Patient has no known allergies.  Review of Systems   Review of Systems  All other systems reviewed and are negative.  Physical Exam Updated Vital Signs BP 139/90   Pulse 64   Temp 99 F (37.2 C) (Oral)   Resp 20   LMP 02/11/2021    SpO2 98%   Physical Exam Vitals and nursing note reviewed.  Constitutional:      General: She is not in acute distress.    Appearance: She is well-developed.  HENT:     Head: Normocephalic and atraumatic.  Eyes:     Conjunctiva/sclera: Conjunctivae normal.     Comments: Mild horizontal nystagmus  Cardiovascular:     Rate and Rhythm: Normal rate and regular rhythm.     Heart sounds: No murmur heard. Pulmonary:     Effort: Pulmonary effort is normal. No respiratory distress.     Breath sounds: Normal breath sounds.     Comments: Lung sounds are clear bilaterally Abdominal:     Palpations: Abdomen is soft.     Tenderness: There is no abdominal tenderness.  Musculoskeletal:        General: Normal range of motion.     Cervical back: Neck supple.  Skin:    General: Skin is warm and dry.  Neurological:     Mental Status: She is alert and oriented to person, place, and time.  Psychiatric:        Mood and Affect: Mood normal.        Behavior: Behavior normal.    ED Results / Procedures / Treatments   Labs (all labs ordered are listed, but only abnormal results are displayed) Labs Reviewed  COMPREHENSIVE METABOLIC PANEL - Abnormal; Notable for the following components:      Result Value   Potassium 3.3 (*)    Calcium 8.7 (*)    Alkaline Phosphatase 37 (*)    All other components within normal limits  CBC WITH DIFFERENTIAL/PLATELET - Abnormal; Notable for the following components:   RBC 3.82 (*)    Hemoglobin 11.5 (*)    HCT 35.0 (*)    All other components within normal limits  RESP PANEL BY RT-PCR (FLU A&B, COVID) ARPGX2  URINALYSIS, ROUTINE W REFLEX MICROSCOPIC  I-STAT BETA HCG BLOOD, ED ([facility], [facility], [facility] ONLY)  TROPONIN I (HIGH SENSITIVITY)  TROPONIN I (HIGH SENSITIVITY)    EKG None  Radiology DG Chest 1 View  Result Date: 03/04/2021 CLINICAL DATA:  Dizziness, chest tightness, shortness of breath EXAM: CHEST  1 VIEW COMPARISON:  05/01/2020 FINDINGS: The heart size and  mediastinal contours are within normal limits. Both lungs are clear. The visualized skeletal structures are unremarkable. IMPRESSION: No active disease. Electronically Signed   By: Charlett Nose M.D.   On: 03/04/2021 16:25  CT Head Wo Contrast  Result Date: 03/04/2021 CLINICAL DATA:  Dizziness history of hypertension EXAM: CT HEAD WITHOUT CONTRAST TECHNIQUE: Contiguous axial images were obtained from the base of the skull through the vertex without intravenous contrast. COMPARISON:  September 02, 2006 FINDINGS: Brain: No evidence of acute infarction, hemorrhage, hydrocephalus, extra-axial collection or mass lesion/mass effect. Vascular: No hyperdense vessel or unexpected calcification. Skull: Hyperostosis interna.  Negative for fracture or focal lesion. Sinuses/Orbits: Visualized paranasal sinuses and ethmoid air cells are predominantly clear. Orbits are grossly unremarkable. Other: None IMPRESSION: No acute intracranial findings. Electronically Signed   By: Maudry Mayhew MD   On: 03/04/2021 17:18    Procedures Procedures   Medications Ordered in ED Medications  meclizine (ANTIVERT) tablet 25 mg (25 mg Oral Given 03/05/21 0041)    ED Course  I have reviewed the triage vital signs and the nursing notes.  Pertinent labs & imaging results that were available during my care of the patient were reviewed by me and considered in my medical decision making (see chart for details).    MDM Rules/Calculators/A&P                          This patient complains of dizziness, shortness of breath, chest tightness, this involves an extensive number of treatment options, and is a complaint that carries with it a high risk of complications and morbidity.    Differential Dx COVID, ACS, PE, pneumonia, vertigo  Pertinent Labs I reviewed labs ordered in triage, which are notable for negative troponin, negative pregnancy test, potassium is 3.3, no leukocytosis, mild anemia at 11.5.   Imaging Interpretation I  reviewed CT scan ordered in triage, no acute intracranial process seen.  Chest x-ray ordered in triage is also negative for obvious findings.  Medications I ordered medication meclizine for dizziness.  Reassessments After the interventions stated above, I reevaluated the patient and found feeling significantly improved after meclizine.  Given reassuring blood work, I doubt ACS.  Patient is not hypoxic nor tachycardic, doubt PE.  COVID test is negative.  Consultants None  Plan Discharge with outpatient follow-up.  Will prescribe meclizine for vertigo.   Final Clinical Impression(s) / ED Diagnoses Final diagnoses:  Vertigo    Rx / DC Orders ED Discharge Orders     None        Roxy Horseman, PA-C 03/05/21 0217    Sabas Sous, MD 03/05/21 [id number]

## 2021-03-20 ENCOUNTER — Other Ambulatory Visit: Payer: Self-pay

## 2021-03-20 ENCOUNTER — Ambulatory Visit: Payer: Self-pay | Attending: Nurse Practitioner | Admitting: Physician Assistant

## 2021-03-20 DIAGNOSIS — R21 Rash and other nonspecific skin eruption: Secondary | ICD-10-CM

## 2021-03-20 DIAGNOSIS — E876 Hypokalemia: Secondary | ICD-10-CM

## 2021-03-20 DIAGNOSIS — Z09 Encounter for follow-up examination after completed treatment for conditions other than malignant neoplasm: Secondary | ICD-10-CM

## 2021-03-20 DIAGNOSIS — R42 Dizziness and giddiness: Secondary | ICD-10-CM

## 2021-03-20 DIAGNOSIS — I10 Essential (primary) hypertension: Secondary | ICD-10-CM

## 2021-03-20 DIAGNOSIS — D649 Anemia, unspecified: Secondary | ICD-10-CM

## 2021-03-20 MED ORDER — LISINOPRIL-HYDROCHLOROTHIAZIDE 20-12.5 MG PO TABS
1.0000 | ORAL_TABLET | Freq: Every day | ORAL | 0 refills | Status: DC
  Filled 2021-03-20 (×2): qty 30, 30d supply, fill #0
  Filled 2021-05-16: qty 30, 30d supply, fill #1
  Filled 2021-06-14: qty 30, 30d supply, fill #2

## 2021-03-20 MED ORDER — FERROUS GLUCONATE 324 (38 FE) MG PO TABS
324.0000 mg | ORAL_TABLET | Freq: Two times a day (BID) | ORAL | 3 refills | Status: DC
  Filled 2021-03-20 – 2021-05-16 (×2): qty 60, 30d supply, fill #0

## 2021-03-20 MED ORDER — CLOTRIMAZOLE-BETAMETHASONE 1-0.05 % EX CREA
1.0000 | TOPICAL_CREAM | Freq: Two times a day (BID) | CUTANEOUS | 0 refills | Status: DC
Start: 2021-03-20 — End: 2021-06-24
  Filled 2021-03-20: qty 45, 20d supply, fill #0

## 2021-03-20 MED ORDER — MECLIZINE HCL 25 MG PO TABS
25.0000 mg | ORAL_TABLET | Freq: Three times a day (TID) | ORAL | 0 refills | Status: DC | PRN
  Filled 2021-03-20: qty 30, 10d supply, fill #0

## 2021-03-20 NOTE — Progress Notes (Signed)
Patient ID: [name], female   DOB: 01/08/1986, 35 y.o.   MRN: [id number]  Virtual Visit via Telephone Note  I connected with [name] on 03/20/21 at  3:50 PM EDT by telephone and verified that I am speaking with the correct person using two identifiers.  Location: Patient: home Provider: [facility] office   I discussed the limitations, risks, security and privacy concerns of performing an evaluation and management service by telephone and the availability of in person appointments. I also discussed with the patient that there may be a patient responsible charge related to this service. The patient expressed understanding and agreed to proceed.   History of Present Illness: After ED visit 03/04/2021 for dizziness and SOB.  This has improed but she still wants to try the meclizine bc her pharmacy did not have it.  Denies cramping.  No CP.    Also she is having itching in her inguinal region.  No vaginal discharge.  She would like a cream for this.    She has resumed taking iron tablets.  From ED A/P: This patient complains of dizziness, shortness of breath, chest tightness, this involves an extensive number of treatment options, and is a complaint that carries with it a high risk of complications and morbidity.     Differential Dx COVID, ACS, PE, pneumonia, vertigo   Pertinent Labs I reviewed labs ordered in triage, which are notable for negative troponin, negative pregnancy test, potassium is 3.3, no leukocytosis, mild anemia at 11.5.     Imaging Interpretation I reviewed CT scan ordered in triage, no acute intracranial process seen.  Chest x-ray ordered in triage is also negative for obvious findings.   Medications I ordered medication meclizine for dizziness.   Reassessments After the interventions stated above, I reevaluated the patient and found feeling significantly improved after meclizine.  Given reassuring blood work, I doubt ACS.  Patient is not hypoxic nor  tachycardic, doubt PE.  COVID test is negative.   Observations/Objective:  NAD.  A&Ox3   Assessment and Plan: 1. Hypertension, unspecified type - lisinopril-hydrochlorothiazide (ZESTORETIC) 20-12.5 MG tablet; Take 1 tablet by mouth daily.  Dispense: 90 tablet; Refill: 0  2. Rash in adult - clotrimazole-betamethasone (LOTRISONE) cream; Apply 1 application topically 2 (two) times daily.  Dispense: 45 g; Refill: 0  3. Hypokalemia - Basic metabolic panel; Future  4. Anemia, unspecified type - ferrous gluconate (FERGON) 324 MG tablet; Take 1 tablet (324 mg total) by mouth 2 (two) times daily with a meal.  Dispense: 60 tablet; Refill: 3  5. Dizziness - meclizine (ANTIVERT) 25 MG tablet; Take 1 tablet (25 mg total) by mouth 3 (three) times daily as needed for dizziness.  Dispense: 30 tablet; Refill: 0 - Basic metabolic panel; Future  6. Encounter for examination following treatment at hospital    Follow Up Instructions: Assign PCP in 2-3 months   I discussed the assessment and treatment plan with the patient. The patient was provided an opportunity to ask questions and all were answered. The patient agreed with the plan and demonstrated an understanding of the instructions.   The patient was advised to call back or seek an in-person evaluation if the symptoms worsen or if the condition fails to improve as anticipated.  I provided 16 minutes of non-face-to-face time during this encounter.   Georgian Co, PA-C

## 2021-03-22 ENCOUNTER — Other Ambulatory Visit: Payer: Self-pay

## 2021-03-22 NOTE — Addendum Note (Signed)
Addended byMemory Dance on: 03/22/2021 03:34 PM   Modules accepted: Orders

## 2021-03-23 LAB — BASIC METABOLIC PANEL
BUN/Creatinine Ratio: 18 (ref 9–23)
BUN: 11 mg/dL (ref 6–20)
CO2: 24 mmol/L (ref 20–29)
Calcium: 8.9 mg/dL (ref 8.7–10.2)
Chloride: 103 mmol/L (ref 96–106)
Creatinine, Ser: 0.61 mg/dL (ref 0.57–1.00)
Glucose: 90 mg/dL (ref 65–99)
Potassium: 3.8 mmol/L (ref 3.5–5.2)
Sodium: 141 mmol/L (ref 134–144)
eGFR: 119 mL/min/{1.73_m2} (ref >59)

## 2021-05-16 ENCOUNTER — Other Ambulatory Visit: Payer: Self-pay

## 2021-05-17 ENCOUNTER — Other Ambulatory Visit: Payer: Self-pay

## 2021-05-21 ENCOUNTER — Other Ambulatory Visit: Payer: Self-pay

## 2021-05-21 ENCOUNTER — Ambulatory Visit: Payer: Self-pay | Admitting: Physician Assistant

## 2021-05-21 VITALS — BP 120/73 | HR 90 | Temp 98.2°F | Resp 18 | Ht 62.0 in | Wt 209.0 lb

## 2021-05-21 DIAGNOSIS — H1031 Unspecified acute conjunctivitis, right eye: Secondary | ICD-10-CM

## 2021-05-21 MED ORDER — POLYMYXIN B-TRIMETHOPRIM 10000-0.1 UNIT/ML-% OP SOLN
1.0000 [drp] | Freq: Four times a day (QID) | OPHTHALMIC | 0 refills | Status: DC
  Filled 2021-05-21: qty 10, 20d supply, fill #0

## 2021-05-21 NOTE — Patient Instructions (Addendum)
You will use Polytrim antibiotic eyedrops 4 times a day for the next 7 days.  I encourage you to continue using warm compresses, avoid touching your eyes.  I hope that you feel better soon  Roney Jaffe, PA-C Physician Assistant [facility] [email or web address]   Bacterial Conjunctivitis, Adult Bacterial conjunctivitis is an infection of the clear membrane that covers the white part of the eye and the inner surface of the eyelid (conjunctiva). When the blood vessels in the conjunctiva become inflamed, the eye becomes red or pink. The eye often feels irritated or itchy. Bacterial conjunctivitis spreads easily from person to person (is contagious). It also spreads easily from one eye to the other eye. What are the causes? This condition is caused by bacteria. You may get the infection if you come into close contact with: A person who is infected with the bacteria. Items that are contaminated with the bacteria, such as a face towel, contact lens solution, or eye makeup. What increases the risk? You are more likely to develop this condition if: You are exposed to other people who have the infection. You wear contact lenses. You have a sinus infection. You have had a recent eye injury or surgery. You have a weak body defense system (immune system). You have a medical condition that causes dry eyes. What are the signs or symptoms? Symptoms of this condition include: Thick, yellowish discharge from the eye. This may turn into a crust on the eyelid overnight and cause your eyelids to stick together. Tearing or watery eyes. Itchy eyes. Burning feeling in your eyes. Eye redness. Swollen eyelids. Blurred vision. How is this diagnosed? This condition is diagnosed based on your symptoms and medical history. Your health care provider may also take a sample of discharge from your eye to find the cause of your infection. How is this  treated? This condition may be treated with: Antibiotic eye drops or ointment to clear the infection more quickly and prevent the spread of infection to others. Antibiotic medicines taken by mouth (orally) to treat infections that do not respond to drops or ointments or that last longer than 10 days. Cool, wet cloths (cool compresses) placed on the eyes. Artificial tears applied 2-6 times a day. Follow these instructions at home: Medicines Take or apply your antibiotic medicine as told by your health care provider. Do not stop using the antibiotic, even if your condition improves, unless directed by your health care provider. Take or apply over-the-counter and prescription medicines only as told by your health care provider. Be very careful to avoid touching the edge of your eyelid with the eye-drop bottle or the ointment tube when you apply medicines to the affected eye. This will keep you from spreading the infection to your other eye or to other people. Managing discomfort Gently wipe away any drainage from your eye with a warm, wet washcloth or a cotton ball. Apply a clean, cool compress to your eye for 10-20 minutes, 3-4 times a day. General instructions Do not wear contact lenses until the inflammation is gone and your health care provider says it is safe to wear them again. Ask your health care provider how to sterilize or replace your contact lenses before you use them again. Wear glasses until you can resume wearing contact lenses. Avoid wearing eye makeup until the inflammation is gone. Throw away any old eye cosmetics that may be contaminated. Change or wash your pillowcase every day. Do not share towels or washcloths. This may  spread the infection. Wash your hands often with soap and water for at least 20 seconds and especially before touching your face or eyes. Use paper towels to dry your hands. Avoid touching or rubbing your eyes. Do not drive or use heavy machinery if your vision  is blurred. Contact a health care provider if: You have a fever. Your symptoms do not get better after 10 days. Get help right away if: You have a fever and your symptoms suddenly get worse. You have severe pain when you move your eye. You have facial pain, redness, or swelling. You have a sudden loss of vision. Summary Bacterial conjunctivitis is an infection of the clear membrane that covers the white part of the eye and the inner surface of the eyelid (conjunctiva). Bacterial conjunctivitis spreads easily from eye to eye and from person to person (is contagious). Wash your hands often with soap and water for at least 20 seconds and especially before touching your face or eyes. Use paper towels to dry your hands. Take or apply your antibiotic medicine as told by your health care provider. Do not stop using the antibiotic even if your condition improves. Contact a health care provider if you have a fever or if your symptoms do not get better after 10 days. Get help right away if you have a sudden loss of vision. This information is not intended to replace advice given to you by your health care provider. Make sure you discuss any questions you have with your health care provider. Document Revised: 11/21/2020 Document Reviewed: 11/21/2020 Elsevier Patient Education  2022 [employer].

## 2021-05-21 NOTE — Progress Notes (Signed)
Established Patient Office Visit  Subjective:  Patient ID: [name], female    DOB: 03-18-1986  Age: 35 y.o. MRN: [id number]  CC:  Chief Complaint  Patient presents with   Eye Drainage     HPI [name] reports that she has been having right eye irritation for the last 3 days, states that she has been having eye matting in the morning, with cloudy secretions during the day.  Reports that her left eye is starting to show similar symptoms.  Denies pain, change in vision, any other upper respiratory symptoms.  Has been using over-the-counter eyedrops without relief.  Denies sick contacts.      Past Medical History:  Diagnosis Date   Anemia    Chronic hypertension during pregnancy, antepartum 12/24/2017   '[x]'$  Aspirin 81 mg daily after 12 weeks; discontinue after 36 weeks Current antihypertensives:  None   Baseline and surveillance labs (pulled in from [facility], refresh links as needed)  Lab Results Component Value Date  PLT 353 11/25/2016  CREATININE 0.50 11/24/2017  AST 24 11/25/2016  ALT 17 11/25/2016  PROTCRRATIO 0.30 (H) 12/31/2015   Antenatal Testing CHTN - O10.919  Group I  BP < 140/90, no preecl   Hypertension    Pregnancy induced hypertension     Past Surgical History:  Procedure Laterality Date   TUBAL LIGATION Bilateral 07/20/2018   Procedure: POST PARTUM TUBAL LIGATION;  Surgeon: Truett Mainland, DO;  Location: [place];  Service: Gynecology;  Laterality: Bilateral;    Family History  Problem Relation Age of Onset   Hypertension Mother     Social History   Socioeconomic History   Marital status: Single    Spouse name: Not on file   Number of children: Not on file   Years of education: Not on file   Highest education level: Not on file  Occupational History   Not on file  Tobacco Use   Smoking status: Former    Types: Cigarettes    Quit date: 03/29/2013    Years since quitting: 8.1   Smokeless tobacco: Never  Vaping Use   Vaping Use: Never  used  Substance and Sexual Activity   Alcohol use: Yes    Comment: occ   Drug use: No   Sexual activity: Yes    Birth control/protection: None  Other Topics Concern   Not on file  Social History Narrative   Not on file   Social Determinants of Health   Financial Resource Strain: Not on file  Food Insecurity: Not on file  Transportation Needs: Not on file  Physical Activity: Not on file  Stress: Not on file  Social Connections: Not on file  Intimate Partner Violence: Not on file    Outpatient Medications Prior to Visit  Medication Sig Dispense Refill   clotrimazole-betamethasone (LOTRISONE) cream Apply 1 application topically 2 (two) times daily. 45 g 0   ferrous gluconate (FERGON) 324 MG tablet Take 1 tablet (324 mg total) by mouth 2 (two) times daily with a meal. 60 tablet 3   lisinopril-hydrochlorothiazide (ZESTORETIC) 20-12.5 MG tablet Take 1 tablet by mouth daily. 90 tablet 0   meclizine (ANTIVERT) 25 MG tablet Take 1 tablet (25 mg total) by mouth 3 (three) times daily as needed for dizziness. 30 tablet 0   cyclobenzaprine (FLEXERIL) 10 MG tablet Take 1 tablet (10 mg total) by mouth 2 (two) times daily as needed for muscle spasms. (Patient not taking: Reported on 05/21/2021) 20 tablet 0  dicyclomine (BENTYL) 20 MG tablet Take 1 tablet (20 mg total) by mouth 2 (two) times daily. (Patient not taking: No sig reported) 20 tablet 0   naproxen (NAPROSYN) 500 MG tablet Take 1 tablet (500 mg total) by mouth 2 (two) times daily. (Patient not taking: Reported on 05/21/2021) 30 tablet 0   omeprazole (PRILOSEC) 20 MG capsule Take 1 capsule (20 mg total) by mouth daily. To reduce stomach acid (Patient not taking: Reported on 05/21/2021) 30 capsule 3   ondansetron (ZOFRAN ODT) 4 MG disintegrating tablet Take 1 tablet (4 mg total) by mouth every 8 (eight) hours as needed for nausea or vomiting. (Patient not taking: Reported on 05/21/2021) 20 tablet 0   Prenatal Vit-DSS-Fe Fum-FA (PRENATAL 19)  tablet Take 1 tablet by mouth daily. (Patient not taking: Reported on 05/21/2021) 30 tablet 11   No facility-administered medications prior to visit.    No Known Allergies  ROS Review of Systems  Constitutional:  Negative for chills and fever.  HENT:  Negative for congestion, ear pain, sinus pressure and sore throat.   Eyes:  Positive for discharge, redness and itching. Negative for photophobia, pain and visual disturbance.  Respiratory:  Negative for shortness of breath.   Cardiovascular:  Negative for chest pain.  Gastrointestinal: Negative.   Endocrine: Negative.   Genitourinary: Negative.   Musculoskeletal: Negative.   Skin: Negative.   Allergic/Immunologic: Negative.   Neurological: Negative.   Hematological: Negative.   Psychiatric/Behavioral: Negative.       Objective:    Physical Exam Vitals and nursing note reviewed.  Constitutional:      Appearance: Normal appearance.  HENT:     Head: Normocephalic and atraumatic.     Right Ear: Tympanic membrane, ear canal and external ear normal.     Left Ear: Tympanic membrane, ear canal and external ear normal.     Nose: Nose normal.     Mouth/Throat:     Mouth: Mucous membranes are moist.     Pharynx: Oropharynx is clear.  Eyes:     General: Lids are normal.        Right eye: Discharge present. No foreign body.        Left eye: Discharge present.No foreign body.     Extraocular Movements:     Right eye: Normal extraocular motion.     Left eye: Normal extraocular motion.     Conjunctiva/sclera:     Right eye: Right conjunctiva is injected. Exudate present.     Left eye: Left conjunctiva is injected. Exudate present.  Cardiovascular:     Rate and Rhythm: Normal rate and regular rhythm.     Pulses: Normal pulses.     Heart sounds: Normal heart sounds.  Pulmonary:     Effort: Pulmonary effort is normal.     Breath sounds: Normal breath sounds.  Musculoskeletal:        General: Normal range of motion.     Cervical  back: Normal range of motion and neck supple.  Skin:    General: Skin is warm and dry.  Neurological:     General: No focal deficit present.     Mental Status: She is alert and oriented to person, place, and time.  Psychiatric:        Mood and Affect: Mood normal.        Behavior: Behavior normal.        Thought Content: Thought content normal.        Judgment: Judgment normal.  BP 120/73 (BP Location: Left Arm, Patient Position: Sitting, Cuff Size: Large)   Pulse 90   Temp 98.2 F (36.8 C) (Oral)   Resp 18   Ht _0  (1.575 m)   Wt 209 lb (94.8 kg)   LMP 04/20/2021   SpO2 97%   BMI 38.23 kg/m  Wt Readings from Last 3 Encounters:  05/21/21 209 lb (94.8 kg)  03/05/21 210 lb (95.3 kg)  06/07/20 204 lb 3.2 oz (92.6 kg)     Health Maintenance Due  Topic Date Due   COVID-19 Vaccine (1) Never done   Hepatitis C Screening  Never done   PAP SMEAR-Modifier  12/24/2020   INFLUENZA VACCINE  Never done    There are no preventive care reminders to display for this patient.  Lab Results  Component Value Date   TSH 0.239 (L) 01/25/2018   Lab Results  Component Value Date   WBC 6.4 03/04/2021   HGB 11.5 (L) 03/04/2021   HCT 35.0 (L) 03/04/2021   MCV 91.6 03/04/2021   PLT 335 03/04/2021   Lab Results  Component Value Date   NA 141 03/22/2021   K 3.8 03/22/2021   CO2 24 03/22/2021   GLUCOSE 90 03/22/2021   BUN 11 03/22/2021   CREATININE 0.61 03/22/2021   BILITOT 0.8 03/04/2021   ALKPHOS 37 (L) 03/04/2021   AST 20 03/04/2021   ALT 16 03/04/2021   PROT 6.6 03/04/2021   ALBUMIN 3.5 03/04/2021   CALCIUM 8.9 03/22/2021   ANIONGAP 8 03/04/2021   EGFR 119 03/22/2021   No results found for: CHOL No results found for: HDL No results found for: LDLCALC No results found for: TRIG No results found for: CHOLHDL No results found for: HGBA1C    Assessment & Plan:   Problem List Items Addressed This Visit   None Visit Diagnoses     Acute bacterial conjunctivitis  of right eye    -  Primary   Relevant Medications   trimethoprim-polymyxin b (POLYTRIM) ophthalmic solution       Meds ordered this encounter  Medications   trimethoprim-polymyxin b (POLYTRIM) ophthalmic solution    Sig: Place 1 drop into both eyes every 6 (six) hours.    Dispense:  10 mL    Refill:  0    Order Specific Question:   Supervising Provider    Answer:   WRIGHT, PATRICK E [[id number]]  1. Acute bacterial conjunctivitis of right eye Trial Polytrim.  Patient education given on supportive care, red flags for prompt reevaluation. - trimethoprim-polymyxin b (POLYTRIM) ophthalmic solution; Place 1 drop into both eyes every 6 (six) hours.  Dispense: 10 mL; Refill: 0   I have reviewed the patient's medical history (PMH, PSH, Social History, Family History, Medications, and allergies) , and have been updated if relevant. I spent 20 minutes reviewing chart and  face to face time with patient.    Follow-up: Return if symptoms worsen or fail to improve.    Loraine Grip Mayers, PA-C

## 2021-05-21 NOTE — Progress Notes (Signed)
Patient reports eye irritation Sunday morning. Patient reports waking up with right eye being closed with secretions with no relief from OTC eye drops. Patient reports right eye is the worse with left eye showing similar symptoms. Patient denies any Hx. Patient denies any chemical exsposure. Patient has eaten today. Patient has not taken medication today.

## 2021-06-17 ENCOUNTER — Other Ambulatory Visit: Payer: Self-pay

## 2021-06-23 NOTE — Progress Notes (Signed)
New Patient Office Visit  Subjective:  Patient ID: [name], female    DOB: 1985/09/17  Age: 35 y.o. MRN: [id number]  CC:  Chief Complaint  Patient presents with   Establish Care   Medication Refill    HPI [name] presents for for primary care to establish.  Patient was seen in mobile medicine because of an eye infection given topical eyedrops previously.  This is resolved this issue.  She continues to have difficulty with a rash in the groin.  She is due a mammogram and Pap smear and needs support for this.  Patient does have increased weight gain  Patient has hypertension does need refills on the lisinopril HCT and iron supplements  Past Medical History:  Diagnosis Date   Anemia    Chronic hypertension during pregnancy, antepartum 12/24/2017   [x]  Aspirin 81 mg daily after 12 weeks; discontinue after 36 weeks Current antihypertensives:  None   Baseline and surveillance labs (pulled in from [facility], refresh links as needed)  Lab Results Component Value Date  PLT 353 11/25/2016  CREATININE 0.50 11/24/2017  AST 24 11/25/2016  ALT 17 11/25/2016  PROTCRRATIO 0.30 (H) 12/31/2015   Antenatal Testing CHTN - O10.919  Group I  BP < 140/90, no preecl   Hypertension    Pregnancy induced hypertension     Past Surgical History:  Procedure Laterality Date   TUBAL LIGATION Bilateral 07/20/2018   Procedure: POST PARTUM TUBAL LIGATION;  Surgeon: 07/22/2018, DO;  Location: [facility];  Service: Gynecology;  Laterality: Bilateral;    Family History  Problem Relation Age of Onset   Stroke Mother    Hypertension Mother    Hypertension Father     Social History   Socioeconomic History   Marital status: Single    Spouse name: Not on file   Number of children: Not on file   Years of education: Not on file   Highest education level: Not on file  Occupational History   Not on file  Tobacco Use   Smoking status: Former    Types: Cigarettes    Quit date: 03/29/2013     Years since quitting: 8.2   Smokeless tobacco: Never  Vaping Use   Vaping Use: Never used  Substance and Sexual Activity   Alcohol use: Yes    Comment: occ   Drug use: No   Sexual activity: Yes    Birth control/protection: None  Other Topics Concern   Not on file  Social History Narrative   Not on file   Social Determinants of Health   Financial Resource Strain: Not on file  Food Insecurity: Not on file  Transportation Needs: Not on file  Physical Activity: Not on file  Stress: Not on file  Social Connections: Not on file  Intimate Partner Violence: Not on file    ROS Review of Systems  Constitutional: Negative.   HENT: Negative.  Negative for ear pain, postnasal drip, rhinorrhea, sinus pressure, sore throat, trouble swallowing and voice change.   Eyes: Negative.   Respiratory: Negative.  Negative for apnea, cough, choking, chest tightness, shortness of breath, wheezing and stridor.   Cardiovascular: Negative.  Negative for chest pain, palpitations and leg swelling.  Gastrointestinal: Negative.  Negative for abdominal distention, abdominal pain, nausea and vomiting.  Genitourinary: Negative.   Musculoskeletal: Negative.  Negative for arthralgias and myalgias.  Skin:  Positive for rash.  Allergic/Immunologic: Negative.  Negative for environmental allergies and food allergies.  Neurological:  Negative.  Negative for dizziness, syncope, weakness and headaches.  Hematological: Negative.  Negative for adenopathy. Does not bruise/bleed easily.  Psychiatric/Behavioral: Negative.  Negative for agitation and sleep disturbance. The patient is not nervous/anxious.    Objective:   Today's Vitals: BP 107/71   Pulse 92   Resp 16   Ht 5\' 2"  (1.575 m)   Wt 212 lb (96.2 kg)   SpO2 91%   BMI 38.78 kg/m   Physical Exam Vitals reviewed.  Constitutional:      Appearance: Normal appearance. She is well-developed. She is obese. She is not diaphoretic.  HENT:     Head:  Normocephalic and atraumatic.     Nose: No nasal deformity, septal deviation, mucosal edema or rhinorrhea.     Right Sinus: No maxillary sinus tenderness or frontal sinus tenderness.     Left Sinus: No maxillary sinus tenderness or frontal sinus tenderness.     Mouth/Throat:     Pharynx: No oropharyngeal exudate.  Eyes:     General: No scleral icterus.    Conjunctiva/sclera: Conjunctivae normal.     Pupils: Pupils are equal, round, and reactive to light.  Neck:     Thyroid: No thyromegaly.     Vascular: No carotid bruit or JVD.     Trachea: Trachea normal. No tracheal tenderness or tracheal deviation.  Cardiovascular:     Rate and Rhythm: Normal rate and regular rhythm.     Chest Wall: PMI is not displaced.     Pulses: Normal pulses. No decreased pulses.     Heart sounds: Normal heart sounds, S1 normal and S2 normal. Heart sounds not distant. No murmur heard. No systolic murmur is present.  No diastolic murmur is present.    No friction rub. No gallop. No S3 or S4 sounds.  Pulmonary:     Effort: No tachypnea, accessory muscle usage or respiratory distress.     Breath sounds: No stridor. No decreased breath sounds, wheezing, rhonchi or rales.  Chest:     Chest wall: No tenderness.  Abdominal:     General: Bowel sounds are normal. There is no distension.     Palpations: Abdomen is soft. Abdomen is not rigid.     Tenderness: There is no abdominal tenderness. There is no guarding or rebound.  Genitourinary:    Comments: Groin rash compatible with Candida Musculoskeletal:        General: Normal range of motion.     Cervical back: Normal range of motion and neck supple. No edema, erythema or rigidity. No muscular tenderness. Normal range of motion.  Lymphadenopathy:     Head:     Right side of head: No submental or submandibular adenopathy.     Left side of head: No submental or submandibular adenopathy.     Cervical: No cervical adenopathy.  Skin:    General: Skin is warm and  dry.     Coloration: Skin is not pale.     Findings: No rash.     Nails: There is no clubbing.  Neurological:     Mental Status: She is alert and oriented to person, place, and time.     Sensory: No sensory deficit.  Psychiatric:        Speech: Speech normal.        Behavior: Behavior normal.    Assessment & Plan:   Problem List Items Addressed This Visit       Cardiovascular and Mediastinum   Hypertension - Primary    Blood pressure  well controlled continue lisinopril HCT and check metabolic panel blood counts      Relevant Medications   lisinopril-hydrochlorothiazide (ZESTORETIC) 20-12.5 MG tablet   Other Relevant Orders   Comprehensive metabolic panel   CBC with Differential/Platelet   Ambulatory referral to [facility]     Other   Anemia    Has been on chronic iron therapy we will check iron levels and CBC refill iron      Relevant Medications   ferrous gluconate (FERGON) 324 MG tablet   Other Relevant Orders   Iron, TIBC and Ferritin Panel   BMI 38.0-38.9,adult    I spent 10 minutes educating the patient on proper diet to reduce weight along with exercise program      Relevant Orders   Hemoglobin A1c   Ambulatory referral to [facility]   Candida albicans infection    Candida infection of groin we will applied note nystatin topically and give systemic Diflucan      Relevant Medications   nystatin (MYCOSTATIN/NYSTOP) powder   fluconazole (DIFLUCAN) 100 MG tablet   Other Visit Diagnoses     Need for hepatitis C screening test       Relevant Orders   HCV Ab w Reflex to Quant PCR       Outpatient Encounter Medications as of 06/24/2021  Medication Sig   fluconazole (DIFLUCAN) 100 MG tablet Take two once then one daily until gone   nystatin (MYCOSTATIN/NYSTOP) powder Apply 1 application topically 3 (three) times daily.   [DISCONTINUED] aspirin 81 MG EC tablet Take by mouth.   [DISCONTINUED]  lisinopril-hydrochlorothiazide (ZESTORETIC) 20-12.5 MG tablet Take 1 tablet by mouth daily.   [DISCONTINUED] meclizine (ANTIVERT) 25 MG tablet Take 1 tablet (25 mg total) by mouth 3 (three) times daily as needed for dizziness.   ferrous gluconate (FERGON) 324 MG tablet Take 1 tablet (324 mg total) by mouth 2 (two) times daily with a meal.   lisinopril-hydrochlorothiazide (ZESTORETIC) 20-12.5 MG tablet Take 1 tablet by mouth daily.   [DISCONTINUED] clotrimazole-betamethasone (LOTRISONE) cream Apply 1 application topically 2 (two) times daily. (Patient not taking: Reported on 06/24/2021)   [DISCONTINUED] cyclobenzaprine (FLEXERIL) 10 MG tablet Take 1 tablet (10 mg total) by mouth 2 (two) times daily as needed for muscle spasms. (Patient not taking: No sig reported)   [DISCONTINUED] dicyclomine (BENTYL) 20 MG tablet Take 1 tablet (20 mg total) by mouth 2 (two) times daily. (Patient not taking: No sig reported)   [DISCONTINUED] ferrous gluconate (FERGON) 324 MG tablet Take 1 tablet (324 mg total) by mouth 2 (two) times daily with a meal. (Patient not taking: Reported on 06/24/2021)   [DISCONTINUED] naproxen (NAPROSYN) 500 MG tablet Take 1 tablet (500 mg total) by mouth 2 (two) times daily. (Patient not taking: No sig reported)   [DISCONTINUED] omeprazole (PRILOSEC) 20 MG capsule Take 1 capsule (20 mg total) by mouth daily. To reduce stomach acid (Patient not taking: No sig reported)   [DISCONTINUED] ondansetron (ZOFRAN ODT) 4 MG disintegrating tablet Take 1 tablet (4 mg total) by mouth every 8 (eight) hours as needed for nausea or vomiting. (Patient not taking: No sig reported)   [DISCONTINUED] Prenatal Vit-DSS-Fe Fum-FA (PRENATAL 19) tablet Take 1 tablet by mouth daily. (Patient not taking: No sig reported)   [DISCONTINUED] trimethoprim-polymyxin b (POLYTRIM) ophthalmic solution Place 1 drop into both eyes every 6 (six) hours. (Patient not taking: Reported on 06/24/2021)   No facility-administered  encounter medications on file as of 06/24/2021.  Patient declined the flu shot at this visit we will arrange for her to have a mammogram and Pap smear through the cancer control program Follow-up: Return in about 3 months (around 09/24/2021).   Shan Levans, MD

## 2021-06-24 ENCOUNTER — Other Ambulatory Visit: Payer: Self-pay

## 2021-06-24 ENCOUNTER — Ambulatory Visit: Payer: Self-pay | Attending: Critical Care Medicine | Admitting: Critical Care Medicine

## 2021-06-24 ENCOUNTER — Encounter: Payer: Self-pay | Admitting: Critical Care Medicine

## 2021-06-24 VITALS — BP 107/71 | HR 92 | Resp 16 | Ht 62.0 in | Wt 212.0 lb

## 2021-06-24 DIAGNOSIS — Z1159 Encounter for screening for other viral diseases: Secondary | ICD-10-CM

## 2021-06-24 DIAGNOSIS — B379 Candidiasis, unspecified: Secondary | ICD-10-CM | POA: Insufficient documentation

## 2021-06-24 DIAGNOSIS — Z6838 Body mass index (BMI) 38.0-38.9, adult: Secondary | ICD-10-CM | POA: Insufficient documentation

## 2021-06-24 DIAGNOSIS — D649 Anemia, unspecified: Secondary | ICD-10-CM

## 2021-06-24 DIAGNOSIS — I10 Essential (primary) hypertension: Secondary | ICD-10-CM

## 2021-06-24 MED ORDER — FERROUS GLUCONATE 324 (38 FE) MG PO TABS
324.0000 mg | ORAL_TABLET | Freq: Two times a day (BID) | ORAL | 3 refills | Status: DC
  Filled 2021-06-24 – 2021-07-12 (×2): qty 60, 30d supply, fill #0

## 2021-06-24 MED ORDER — NYSTATIN 100000 UNIT/GM EX POWD
1.0000 | Freq: Three times a day (TID) | CUTANEOUS | 0 refills | Status: DC
Start: 2021-06-24 — End: 2021-08-28
  Filled 2021-06-24: qty 15, 5d supply, fill #0

## 2021-06-24 MED ORDER — FLUCONAZOLE 100 MG PO TABS
ORAL_TABLET | ORAL | 0 refills | Status: DC
  Filled 2021-06-24: qty 7, 6d supply, fill #0

## 2021-06-24 MED ORDER — LISINOPRIL-HYDROCHLOROTHIAZIDE 20-12.5 MG PO TABS
1.0000 | ORAL_TABLET | Freq: Every day | ORAL | 3 refills | Status: DC
  Filled 2021-06-24 – 2021-10-10 (×3): qty 90, 90d supply, fill #0
  Filled 2021-10-10: qty 90, 90d supply, fill #1

## 2021-06-24 NOTE — Assessment & Plan Note (Signed)
Candida infection of groin we will applied note nystatin topically and give systemic Diflucan

## 2021-06-24 NOTE — Progress Notes (Signed)
Pt was prescribed eye drops for pink eye and was wondering if she could get a refill.

## 2021-06-24 NOTE — Patient Instructions (Addendum)
Referral to [facility] well fitness was made  Refills on your blood pressure medication and iron tablets sent to you the pharmacy here  Nystatin powder apply to groin 3 times daily and oral Diflucan to take for 5 days was sent to our pharmacy  Complete set of screening labs today were obtained  Referral to the cancer screening program for a mammogram and Pap smear was made see below and brochure we gave you Call the cancer control program to apply for free mammogram and pap smear, they will order the studies once approved The number is:   [phone]  Follow healthy diet as attached  Return to see Dr. Delford Field in 3 months

## 2021-06-24 NOTE — Assessment & Plan Note (Signed)
Has been on chronic iron therapy we will check iron levels and CBC refill iron

## 2021-06-24 NOTE — Assessment & Plan Note (Signed)
Blood pressure well controlled continue lisinopril HCT and check metabolic panel blood counts

## 2021-06-24 NOTE — Assessment & Plan Note (Signed)
I spent 10 minutes educating the patient on proper diet to reduce weight along with exercise program

## 2021-06-25 ENCOUNTER — Other Ambulatory Visit: Payer: Self-pay

## 2021-06-25 LAB — COMPREHENSIVE METABOLIC PANEL
ALT: 19 IU/L (ref 0–32)
AST: 20 IU/L (ref 0–40)
Albumin/Globulin Ratio: 1.6 (ref 1.2–2.2)
Albumin: 4.7 g/dL (ref 3.8–4.8)
Alkaline Phosphatase: 56 IU/L (ref 44–121)
BUN/Creatinine Ratio: 15 (ref 9–23)
BUN: 11 mg/dL (ref 6–20)
Bilirubin Total: 0.4 mg/dL (ref 0.0–1.2)
CO2: 22 mmol/L (ref 20–29)
Calcium: 9.4 mg/dL (ref 8.7–10.2)
Chloride: 98 mmol/L (ref 96–106)
Creatinine, Ser: 0.74 mg/dL (ref 0.57–1.00)
Globulin, Total: 2.9 g/dL (ref 1.5–4.5)
Glucose: 79 mg/dL (ref 70–99)
Potassium: 4.1 mmol/L (ref 3.5–5.2)
Sodium: 136 mmol/L (ref 134–144)
Total Protein: 7.6 g/dL (ref 6.0–8.5)
eGFR: 108 mL/min/{1.73_m2} (ref >59)

## 2021-06-25 LAB — CBC WITH DIFFERENTIAL/PLATELET
Basophils Absolute: 0 10*3/uL (ref 0.0–0.2)
Basos: 1 %
EOS (ABSOLUTE): 0.1 10*3/uL (ref 0.0–0.4)
Eos: 1 %
Hematocrit: 37.1 % (ref 34.0–46.6)
Hemoglobin: 12.4 g/dL (ref 11.1–15.9)
Immature Grans (Abs): 0 10*3/uL (ref 0.0–0.1)
Immature Granulocytes: 0 %
Lymphocytes Absolute: 3.4 10*3/uL — ABNORMAL HIGH (ref 0.7–3.1)
Lymphs: 43 %
MCH: 29.8 pg (ref 26.6–33.0)
MCHC: 33.4 g/dL (ref 31.5–35.7)
MCV: 89 fL (ref 79–97)
Monocytes Absolute: 0.6 10*3/uL (ref 0.1–0.9)
Monocytes: 7 %
Neutrophils Absolute: 3.8 10*3/uL (ref 1.4–7.0)
Neutrophils: 48 %
Platelets: 389 10*3/uL (ref 150–450)
RBC: 4.16 x10E6/uL (ref 3.77–5.28)
RDW: 12.1 % (ref 11.7–15.4)
WBC: 7.8 10*3/uL (ref 3.4–10.8)

## 2021-06-25 LAB — IRON,TIBC AND FERRITIN PANEL
Ferritin: 114 ng/mL (ref 15–150)
Iron Saturation: 24 % (ref 15–55)
Iron: 80 ug/dL (ref 27–159)
Total Iron Binding Capacity: 337 ug/dL (ref 250–450)
UIBC: 257 ug/dL (ref 131–425)

## 2021-06-25 LAB — HEMOGLOBIN A1C
Est. average glucose Bld gHb Est-mCnc: 88 mg/dL
Hgb A1c MFr Bld: 4.7 % — ABNORMAL LOW (ref 4.8–5.6)

## 2021-06-25 LAB — HCV AB W REFLEX TO QUANT PCR: HCV Ab: <0.1 s/co ratio (ref 0.0–0.9)

## 2021-06-25 LAB — HCV INTERPRETATION

## 2021-06-26 ENCOUNTER — Other Ambulatory Visit: Payer: Self-pay

## 2021-07-15 ENCOUNTER — Other Ambulatory Visit: Payer: Self-pay

## 2021-07-16 ENCOUNTER — Other Ambulatory Visit: Payer: Self-pay

## 2021-08-28 ENCOUNTER — Encounter (HOSPITAL_COMMUNITY): Payer: Self-pay

## 2021-08-28 ENCOUNTER — Ambulatory Visit (HOSPITAL_COMMUNITY)
Admission: EM | Admit: 2021-08-28 | Discharge: 2021-08-28 | Disposition: A | Discharge status: Home / Self Care | Payer: Medicaid Other | Attending: Emergency Medicine | Admitting: Emergency Medicine

## 2021-08-28 ENCOUNTER — Other Ambulatory Visit: Payer: Self-pay

## 2021-08-28 ENCOUNTER — Ambulatory Visit: Payer: Self-pay | Admitting: *Deleted

## 2021-08-28 DIAGNOSIS — T192XXA Foreign body in vulva and vagina, initial encounter: Secondary | ICD-10-CM | POA: Diagnosis not present

## 2021-08-28 NOTE — ED Triage Notes (Signed)
Pt states started menstrual cycle yesterday and it was heavy so she covered a tampon with summer eve wipes and placed it in her vagina. States when she pulled out the tampon the wipes didn't come out and she hasn't bleed since.

## 2021-08-28 NOTE — Telephone Encounter (Signed)
Pt calling in.    Last night after work I started having period cramps.   I put in a tampon in.   Someone told me to put Femine wipes over the tampon.   Summer's Eve wipe is what I put over the tampon.    When I pulled the tampon out the wipe did not come out.    It's still up in my vagina.   I can't get it out. I referred her to the ED but due to the long waits she has decided to go to the urgent care which she was agreeable to doing.   I instructed her not to put wipes around the tampons in the future and she told me she would not.

## 2021-08-28 NOTE — Discharge Instructions (Signed)
Object was able to be removed from vaginal canal today  Moving forward please only insert tampons into the vaginal canal, if concern for heavy bleeding you will just need to change tampon more frequently or use tampons specifically for heavy flow  As for your menstrual cycle occurring 1 day, continue to monitor as it is already a regular

## 2021-08-28 NOTE — ED Provider Notes (Signed)
[facility]    CSN: [id number] Arrival date & time: 08/28/21  1337      History   Chief Complaint Chief Complaint  Patient presents with   Foreign Body in Vagina    HPI [name] is a 36 y.o. female.   Patient presents with a foreign body in vagina for 1 day.  Endorses that she had heavy bleeding with menses yesterday to relieve symptoms inserted summer eves wipes over tampon.  When she removed her tampon wipes did not come out.  Endorses that she has had no bleeding today.  Periods are irregular history of tubal ligation.  Denies fever, chills, vaginal discharge, itching, irritation, abdominal pain or pressure, flank pain.   Past Medical History:  Diagnosis Date   Anemia    Chronic hypertension during pregnancy, antepartum 12/24/2017   [x]  Aspirin 81 mg daily after 12 weeks; discontinue after 36 weeks Current antihypertensives:  None   Baseline and surveillance labs (pulled in from [facility], refresh links as needed)  Lab Results Component Value Date  PLT 353 11/25/2016  CREATININE 0.50 11/24/2017  AST 24 11/25/2016  ALT 17 11/25/2016  PROTCRRATIO 0.30 (H) 12/31/2015   Antenatal Testing CHTN - O10.919  Group I  BP < 140/90, no preecl   Hypertension    Pregnancy induced hypertension     Patient Active Problem List   Diagnosis Date Noted   BMI 38.0-38.9,adult 06/24/2021   Candida albicans infection 06/24/2021   Anemia 03/20/2021   Hypertension     Past Surgical History:  Procedure Laterality Date   TUBAL LIGATION Bilateral 07/20/2018   Procedure: POST PARTUM TUBAL LIGATION;  Surgeon: Levie HeritageStinson, Jacob J, DO;  Location: [facility];  Service: Gynecology;  Laterality: Bilateral;    OB History     Gravida  4   Para  4   Term  4   Preterm  0   AB  0   Living  4      SAB  0   IAB  0   Ectopic  0   Multiple  0   Live Births  4            Home Medications    Prior to Admission medications   Medication Sig Start Date End Date  Taking? Authorizing Provider  ferrous gluconate (FERGON) 324 MG tablet Take 1 tablet (324 mg total) by mouth 2 (two) times daily with a meal. 06/24/21   Storm FriskWright, Patrick E, MD  lisinopril-hydrochlorothiazide (ZESTORETIC) 20-12.5 MG tablet Take 1 tablet by mouth daily. 06/24/21   Storm FriskWright, Patrick E, MD    Family History Family History  Problem Relation Age of Onset   Stroke Mother    Hypertension Mother    Hypertension Father     Social History Social History   Tobacco Use   Smoking status: Former    Types: Cigarettes    Quit date: 03/29/2013    Years since quitting: 8.4   Smokeless tobacco: Never  Vaping Use   Vaping Use: Never used  Substance Use Topics   Alcohol use: Yes    Comment: occ   Drug use: No     Allergies   Patient has no known allergies.   Review of Systems Review of Systems  Constitutional: Negative.   Respiratory: Negative.    Cardiovascular: Negative.   Genitourinary: Negative.   Neurological: Negative.     Physical Exam Triage Vital Signs ED Triage Vitals [08/28/21 1457]  Enc Vitals Group  BP 106/67     Pulse Rate 84     Resp 18     Temp 99.1 F (37.3 C)     Temp Source Oral     SpO2 97 %     Weight      Height      Head Circumference      Peak Flow      Pain Score 3     Pain Loc      Pain Edu?      Excl. in GC?    No data found.  Updated Vital Signs BP 106/67 (BP Location: Right Arm)    Pulse 84    Temp 99.1 F (37.3 C) (Oral)    Resp 18    LMP 08/27/2021    SpO2 97%   Visual Acuity Right Eye Distance:   Left Eye Distance:   Bilateral Distance:    Right Eye Near:   Left Eye Near:    Bilateral Near:     Physical Exam Exam conducted with a chaperone present.  Constitutional:      Appearance: Normal appearance.  HENT:     Head: Normocephalic.  Eyes:     Extraocular Movements: Extraocular movements intact.  Pulmonary:     Effort: Pulmonary effort is normal.  Genitourinary:    Exam position: Lithotomy position.      Comments: Foreign body present at base of vaginal canal over cervix Skin:    General: Skin is warm and dry.  Neurological:     Mental Status: She is alert and oriented to person, place, and time. Mental status is at baseline.  Psychiatric:        Mood and Affect: Mood normal.        Behavior: Behavior normal.     UC Treatments / Results  Labs (all labs ordered are listed, but only abnormal results are displayed) Labs Reviewed - No data to display  EKG   Radiology No results found.  Procedures Foreign Body Removal  Date/Time: 08/28/2021 3:59 PM Performed by: Valinda Hoar, NP Authorized by: Valinda Hoar, NP   Consent:    Consent obtained:  Verbal   Consent given by:  Patient   Risks, benefits, and alternatives were discussed: yes     Risks discussed:  Incomplete removal and infection Universal protocol:    Procedure explained and questions answered to patient or proxy's satisfaction: yes     Patient identity confirmed:  Verbally with patient Location:    Location: vagina. Pre-procedure details:    Imaging:  None Anesthesia:    Anesthesia method:  None Procedure type:    Procedure complexity:  Simple Procedure details:    Localization method:  Visualized   Dissection of underlying tissues: no     Bloodless field: yes     Removal mechanism:  Forceps   Foreign bodies recovered:  2   Description:  Maven Rosander wipes   Intact foreign body removal: yes   Post-procedure details:    Neurovascular status: intact     Confirmation:  No additional foreign bodies on visualization   Skin closure:  None   Procedure completion:  Tolerated (including critical care time)  Medications Ordered in UC Medications - No data to display  Initial Impression / Assessment and Plan / UC Course  I have reviewed the triage vital signs and the nursing notes.  Pertinent labs & imaging results that were available during my care of the patient were reviewed by me and considered in  my  medical decision making (see chart for details).  Foreign body in vagina  Removed by forceps, discussed management of heavy bleeding in future, advised use of heavy flow tampons or frequent changing of tampon, patient to monitor for signs of infection, may return to UC as needed   Final Clinical Impressions(s) / UC Diagnoses   Final diagnoses:  Foreign body in vagina, initial encounter     Discharge Instructions      Object was able to be removed from vaginal canal today  Moving forward please only insert tampons into the vaginal canal, if concern for heavy bleeding you will just need to change tampon more frequently or use tampons specifically for heavy flow  As for your menstrual cycle occurring 1 day, continue to monitor as it is already a regular   ED Prescriptions   None    PDMP not reviewed this encounter.   Valinda Hoar, NP 08/28/21 1601

## 2021-10-10 ENCOUNTER — Telehealth: Payer: Self-pay | Admitting: Critical Care Medicine

## 2021-10-10 ENCOUNTER — Other Ambulatory Visit: Payer: Self-pay

## 2021-10-10 NOTE — Telephone Encounter (Signed)
Copied from CRM [id number]. Topic: Referral - Request for Referral >> Oct 10, 2021  4:05 PM Herby Abraham C wrote: Pt received a notification that she is due to have her pap. Pt's pcp doesn't complete paps. Pt is requesting a referral to a GYN to assist further.    [phone]

## 2021-10-10 NOTE — Telephone Encounter (Signed)
Can I book her with [place] or a referral?

## 2021-10-10 NOTE — Telephone Encounter (Signed)
Book her with angela mcclung for pap

## 2021-10-11 NOTE — Telephone Encounter (Signed)
Called pt and left vm about appt for pap

## 2021-11-06 ENCOUNTER — Other Ambulatory Visit: Payer: Self-pay

## 2021-11-06 ENCOUNTER — Ambulatory Visit: Payer: Medicaid Other | Attending: Physician Assistant | Admitting: Physician Assistant

## 2021-11-06 ENCOUNTER — Encounter: Payer: Self-pay | Admitting: Physician Assistant

## 2021-11-06 VITALS — BP 107/73 | HR 103 | Resp 18 | Ht 62.0 in | Wt 216.0 lb

## 2021-11-06 DIAGNOSIS — Z124 Encounter for screening for malignant neoplasm of cervix: Secondary | ICD-10-CM | POA: Diagnosis not present

## 2021-11-06 NOTE — Progress Notes (Signed)
Patient ID: [name], female   DOB: 1986-04-20, 36 y.o.   MRN: [id number] ? ? ? ? ?[name], is a 36 y.o. female ? ?[id number] ? ?[id number] ? ?DOB - [birth year] ? ?Chief Complaint  ?Patient presents with  ? Gynecologic Exam  ?    ? ?Subjective:  ? ?[name] is a 36 y.o. female here today for pap only.  Just finished her period.  No pelvic pain or abnormal vaginal discharge.   ? ?Extensive labs 05/2022  ? ? ?No problems updated. ? ?ALLERGIES: ?No Known Allergies ? ?PAST MEDICAL HISTORY: ?Past Medical History:  ?Diagnosis Date  ? Anemia   ? Chronic hypertension during pregnancy, antepartum 12/24/2017  ? [x]  Aspirin 81 mg daily after 12 weeks; discontinue after 36 weeks Current antihypertensives:  None   Baseline and surveillance labs (pulled in from [facility], refresh links as needed)  Lab Results Component Value Date  PLT 353 11/25/2016  CREATININE 0.50 11/24/2017  AST 24 11/25/2016  ALT 17 11/25/2016  PROTCRRATIO 0.30 (H) 12/31/2015   Antenatal Testing CHTN - O10.919  Group I  BP < 140/90, no preecl  ? Hypertension   ? Pregnancy induced hypertension   ? ? ?MEDICATIONS AT HOME: ?Prior to Admission medications   ?Medication Sig Start Date End Date Taking? Authorizing Provider  ?ferrous gluconate (FERGON) 324 MG tablet Take 1 tablet (324 mg total) by mouth 2 (two) times daily with a meal. 06/24/21  Yes 06/26/21, MD  ?lisinopril-hydrochlorothiazide (ZESTORETIC) 20-12.5 MG tablet Take 1 tablet by mouth daily. 06/24/21  Yes 06/26/21, MD  ? ? ?ROS: ?Neg HEENT ?Neg resp ?Neg cardiac ?Neg GI ?Neg GU ?Neg MS ?Neg psych ?Neg neuro ? ?Objective:  ? ?Vitals:  ? 11/06/21 1453 11/06/21 1456  ?BP:  107/73  ?Pulse:  (!) 103  ?Resp:  18  ?SpO2:  98%  ?Weight: 216 lb (98 kg) 216 lb (98 kg)  ?Height: 5\' 2"  (1.575 m) 5\' 2"  (1.575 m)  ? ?Exam ?General appearance : Awake, alert, not in any distress. Speech Clear. Not toxic looking ?GU: external genitalia WNL-speculum inserted; vaginal rugae wnl, cervix only  partly visible and pap taken.  Swabs taken.  Bimanual unremarkable ?Neurology: Awake alert, and oriented X 3, CN II-XII intact, Non focal ?Skin: No Rash ? ?Data Review ?Lab Results  ?Component Value Date  ? HGBA1C 4.7 (L) 06/24/2021  ? ? ?Assessment & Plan  ? ?1. Cervical cancer screening ?- Cervicovaginal ancillary only ?- Cytology - PAP([place]) ? ? ? ?Patient have been counseled extensively about nutrition and exercise. Other issues discussed during this visit include: low cholesterol diet, weight control and daily exercise, foot care, annual eye examinations at Ophthalmology, importance of adherence with medications and regular follow-up. We also discussed long term complications of uncontrolled diabetes and hypertension.  ? ?Return in about 6 months (around 05/09/2022) for dr for htn/anemia. ? ?The patient was given clear instructions to go to ER or return to medical center if symptoms don't improve, worsen or new problems develop. The patient verbalized understanding. The patient was told to call to get lab results if they haven't heard anything in the next week.  ? ? ? ? ?06/26/2021, PA-C ?[facility] ?[place], [place] ?[phone]   ?11/06/2021, 3:22 PM  ?

## 2021-11-07 ENCOUNTER — Telehealth: Payer: Self-pay | Admitting: Critical Care Medicine

## 2021-11-07 NOTE — Telephone Encounter (Signed)
Called pt but couldn't leave vm will call back later ? ?

## 2021-11-07 NOTE — Telephone Encounter (Signed)
Will call pt and reschedule pap.  ?

## 2021-11-07 NOTE — Telephone Encounter (Unsigned)
Copied from CRM [id number]. Topic: General - Other ?>> Nov 07, 2021 11:39 AM Wyonia Hough E wrote: ?Reason for CRM: specimen tube was open and specimen leaked out / so they have to reject the swab / please advise ?

## 2021-11-08 NOTE — Telephone Encounter (Signed)
Called pt , mailbox was full will send a Mychart message about rescheduling  ?

## 2022-01-08 ENCOUNTER — Other Ambulatory Visit (HOSPITAL_COMMUNITY)
Admission: RE | Admit: 2022-01-08 | Discharge: 2022-01-08 | Disposition: A | Discharge status: Home / Self Care | Payer: Commercial Managed Care - PPO | Source: Ambulatory Visit | Attending: Physician Assistant | Admitting: Physician Assistant

## 2022-01-08 ENCOUNTER — Ambulatory Visit: Payer: Medicaid Other | Attending: Physician Assistant | Admitting: Physician Assistant

## 2022-01-08 ENCOUNTER — Encounter: Payer: Self-pay | Admitting: Physician Assistant

## 2022-01-08 VITALS — BP 119/82 | HR 82 | Wt 209.6 lb

## 2022-01-08 DIAGNOSIS — Z124 Encounter for screening for malignant neoplasm of cervix: Secondary | ICD-10-CM | POA: Diagnosis not present

## 2022-01-08 DIAGNOSIS — I10 Essential (primary) hypertension: Secondary | ICD-10-CM | POA: Diagnosis not present

## 2022-01-08 DIAGNOSIS — Z113 Encounter for screening for infections with a predominantly sexual mode of transmission: Secondary | ICD-10-CM

## 2022-01-08 DIAGNOSIS — J0100 Acute maxillary sinusitis, unspecified: Secondary | ICD-10-CM

## 2022-01-08 MED ORDER — FLUCONAZOLE 150 MG PO TABS: 150.0000 mg | ORAL_TABLET | Freq: Once | ORAL | 0 refills | Status: AC

## 2022-01-08 MED ORDER — LISINOPRIL-HYDROCHLOROTHIAZIDE 20-12.5 MG PO TABS: 1.0000 | ORAL_TABLET | Freq: Every day | ORAL | 3 refills | Status: DC

## 2022-01-08 MED ORDER — FLUTICASONE PROPIONATE 50 MCG/ACT NA SUSP: 2.0000 | Freq: Every day | NASAL | 6 refills | Status: DC

## 2022-01-08 MED ORDER — AMOXICILLIN 500 MG PO CAPS: 500.0000 mg | ORAL_CAPSULE | Freq: Three times a day (TID) | ORAL | 0 refills | Status: AC

## 2022-01-08 NOTE — Progress Notes (Signed)
Patient ID: [name], female   DOB: Dec 28, 1985, 36 y.o.   MRN: [id number] ? ? ?Yanissa Mabile, is a 36 y.o. female ? ?[id number] ? ?[id number] ? ?DOB - July 05, 1986 ? ?Chief Complaint  ?Patient presents with  ? Gynecologic Exam  ? Medication Refill  ?    ? ?Subjective:  ? ?Elma Sandness is a 36 y.o. female here today for pap and URI.  No vaginal discharge.  Has BTL for pregnancy prevention.  No s/sx of STD.  No h/o abnornal pap.  Finishing period now.   ? ?sinus ?Onset:  6 days ago ?Location:  facial pressure, congestion ?Duration: almost 1 week  ?Characterization/quality: mucus getting thicker and yellow; mild cough.  No f/c ?Alleviating/aggravating:  nyquil helped with sleep.  Not taking daytime meds ?Radiation: none ?Severity: moderate ? ?No problems updated. ? ?ALLERGIES: ?No Known Allergies ? ?PAST MEDICAL HISTORY: ?Past Medical History:  ?Diagnosis Date  ? Anemia   ? Chronic hypertension during pregnancy, antepartum 12/24/2017  ? [x]  Aspirin 81 mg daily after 12 weeks; discontinue after 36 weeks Current antihypertensives:  None   Baseline and surveillance labs (pulled in from [facility], refresh links as needed)  Lab Results Component Value Date  PLT 353 11/25/2016  CREATININE 0.50 11/24/2017  AST 24 11/25/2016  ALT 17 11/25/2016  PROTCRRATIO 0.30 (H) 12/31/2015   Antenatal Testing CHTN - O10.919  Group I  BP < 140/90, no preecl  ? Hypertension   ? Pregnancy induced hypertension   ? ? ?MEDICATIONS AT HOME: ?Prior to Admission medications   ?Medication Sig Start Date End Date Taking? Authorizing Provider  ?amoxicillin (AMOXIL) 500 MG capsule Take 1 capsule (500 mg total) by mouth 3 (three) times daily for 10 days. 01/08/22 01/18/22 Yes Argentina Donovan, PA-C  ?ferrous gluconate (FERGON) 324 MG tablet Take 1 tablet (324 mg total) by mouth 2 (two) times daily with a meal. 06/24/21  Yes Elsie Stain, MD  ?fluconazole (DIFLUCAN) 150 MG tablet Take 1 tablet (150 mg total) by mouth once for 1 dose. If needed for  yeast infection after antibiotics 01/08/22 01/08/22 Yes Reco Shonk, Dionne Bucy, PA-C  ?fluticasone (FLONASE) 50 MCG/ACT nasal spray Place 2 sprays into both nostrils daily. 01/08/22  Yes Argentina Donovan, PA-C  ?lisinopril-hydrochlorothiazide (ZESTORETIC) 20-12.5 MG tablet Take 1 tablet by mouth daily. 01/08/22   Argentina Donovan, PA-C  ? ? ?ROS: ?Neg cardiac ?Neg GI ?Neg GU ?Neg MS ?Neg psych ?Neg neuro ? ?Objective:  ? ?Vitals:  ? 01/08/22 1603  ?BP: 119/82  ?Pulse: 82  ?SpO2: 97%  ?Weight: 209 lb 9.6 oz (95.1 kg)  ? ?Exam ?General appearance : Awake, alert, not in any distress. Speech Clear. Not toxic looking ?HEENT: Atraumatic and Normocephalic.  TM full B without erythema/infection.  Maxillary sinuses full.  Sounds congested ?Neck: Supple, no JVD. No cervical lymphadenopathy.  ?Chest: Good air entry bilaterally, CTAB.  No rales/rhonchi/wheezing ?CVS: S1 S2 regular, no murmurs.  ?GU:  external genitalia WNL, speculum inserted.  Cervix multiparous, scant blood at os, pap and swabs taken.  Bimanual unremarkable.   ?Extremities: B/L Lower Ext shows no edema, both legs are warm to touch ?Neurology: Awake alert, and oriented X 3, CN II-XII intact, Non focal ?Skin: No Rash ? ?Data Review ?Lab Results  ?Component Value Date  ? HGBA1C 4.7 (L) 06/24/2021  ? ? ?Assessment & Plan  ? ?1. Hypertension, unspecified type ?Controlled-continue current regimen ?- lisinopril-hydrochlorothiazide (ZESTORETIC) 20-12.5 MG tablet; Take 1 tablet by mouth daily.  Dispense: 90 tablet; Refill: 3 ? ?2. Screening examination for STD (sexually transmitted disease) ?Safe sex practices ?- Cervicovaginal ancillary only ?- HIV antibody (with reflex) ?- RPR w/reflex to TrepSure ? ?3. Screening for cervical cancer ?- Cytology - PAP([place]) ? ?4. Acute maxillary sinusitis, recurrence not specified ?- amoxicillin (AMOXIL) 500 MG capsule; Take 1 capsule (500 mg total) by mouth 3 (three) times daily for 10 days.  Dispense: 30 capsule; Refill: 0 ?-  fluconazole (DIFLUCAN) 150 MG tablet; Take 1 tablet (150 mg total) by mouth once for 1 dose. If needed for yeast infection after antibiotics  Dispense: 1 tablet; Refill: 0 ?- fluticasone (FLONASE) 50 MCG/ACT nasal spray; Place 2 sprays into both nostrils daily.  Dispense: 16 g; Refill: 6 ? ? ? ?Return in about 6 months (around 07/11/2022) for PCP chronic conditions. ? ?The patient was given clear instructions to go to ER or return to medical center if symptoms don't improve, worsen or new problems develop. The patient verbalized understanding. The patient was told to call to get lab results if they haven't heard anything in the next week.  ? ? ? ? ?Freeman Caldron, PA-C ?[place] ?[place], [place] ?[phone]   ?01/08/2022, 4:15 PM  ?

## 2022-01-09 ENCOUNTER — Other Ambulatory Visit: Payer: Self-pay | Admitting: Physician Assistant

## 2022-01-09 DIAGNOSIS — A749 Chlamydial infection, unspecified: Secondary | ICD-10-CM

## 2022-01-09 LAB — CERVICOVAGINAL ANCILLARY ONLY
Bacterial Vaginitis (gardnerella): POSITIVE — AB
Candida Glabrata: NEGATIVE
Candida Vaginitis: NEGATIVE
Chlamydia: POSITIVE — AB
Comment: NEGATIVE
Comment: NEGATIVE
Comment: NEGATIVE
Comment: NEGATIVE
Comment: NEGATIVE
Comment: NORMAL
Neisseria Gonorrhea: NEGATIVE
Trichomonas: NEGATIVE

## 2022-01-09 LAB — HIV ANTIBODY (ROUTINE TESTING W REFLEX): HIV Screen 4th Generation wRfx: NONREACTIVE

## 2022-01-09 LAB — T PALLIDUM ANTIBODY, EIA: T pallidum Antibody, EIA: NEGATIVE

## 2022-01-09 LAB — RPR W/REFLEX TO TREPSURE: RPR: NONREACTIVE

## 2022-01-09 MED ORDER — FLUCONAZOLE 150 MG PO TABS: 150.0000 mg | ORAL_TABLET | Freq: Once | ORAL | 0 refills | Status: AC

## 2022-01-09 MED ORDER — DOXYCYCLINE HYCLATE 100 MG PO TABS: 100.0000 mg | ORAL_TABLET | Freq: Two times a day (BID) | ORAL | 0 refills | Status: DC

## 2022-01-10 LAB — CYTOLOGY - PAP
Comment: NEGATIVE
Diagnosis: NEGATIVE
High risk HPV: NEGATIVE

## 2022-03-21 ENCOUNTER — Ambulatory Visit: Payer: Self-pay

## 2022-03-21 NOTE — Telephone Encounter (Signed)
  Chief Complaint: stye to both eyes Symptoms: pus draining from right upper lid , swollen right eyelid and left lower lid, mild pain Frequency: 2 weeks Pertinent Negatives: Patient denies blurred vision Disposition: [] ED /[] Urgent Care (no appt availability in office) / [x] Appointment(In office/virtual)/ []  [facility]/ [] Home Care/ [] Refused Recommended Disposition /[] [facility]/ []  Follow-up with PCP Additional Notes: pt has appt , not able to find a sooner appt Reason for Disposition  [1] After 5 days of treatment per [facility] Advice AND [2] not better  Answer Assessment - Initial Assessment Questions 1. LOCATION: "Which eye has the sty?" "Upper or lower eyelid?"     Left lower lid right eye on upper lid  2. SIZE: "How big is it?" (Note: standard pencil eraser is 6 mm)    lower Left: end of pen   right style: smaller that eraser head 3. EYELID: "Is the eyelid swollen?" If Yes, ask: "How much?"     yes 4. REDNESS: "Has the redness spread onto the eyelid?"     Yes to right eye 5. ONSET: "When did you notice the sty?"     2 weeks 6. VISION: "Do you have blurred vision?"      no 7. PAIN: "Is it painful?" If Yes, ask: "How bad is the pain?"  (Scale 1-10; or mild, moderate, severe)     mild 8. CONTACTS: "Do you wear contacts?"     no 9. OTHER SYMPTOMS: "Do you have any other symptoms?" (e.g., fever)     Pus to right upper lid  Protocols used: Sty-A-AH

## 2022-03-21 NOTE — Telephone Encounter (Signed)
Pt called, sounded like pt answered first call but then disconnected, called back. LVMTCB to discuss symptoms.   Summary: sty on both eyes   Pt called in because she has a apt on 8/2. Pt says that she has a sty on both of her eyes that is puffy and has pus. Pt says that sty isn't affecting her vision. Pt would like to discuss and be advised further

## 2022-03-26 ENCOUNTER — Encounter: Payer: Self-pay | Admitting: Physician Assistant

## 2022-03-26 ENCOUNTER — Ambulatory Visit: Payer: Commercial Managed Care - PPO | Attending: Physician Assistant | Admitting: Physician Assistant

## 2022-03-26 ENCOUNTER — Other Ambulatory Visit (HOSPITAL_COMMUNITY)
Admission: RE | Admit: 2022-03-26 | Discharge: 2022-03-26 | Disposition: A | Discharge status: Home / Self Care | Payer: Commercial Managed Care - PPO | Source: Ambulatory Visit | Attending: Physician Assistant | Admitting: Physician Assistant

## 2022-03-26 VITALS — BP 117/80 | HR 91 | Ht 62.0 in | Wt 212.6 lb

## 2022-03-26 DIAGNOSIS — Z23 Encounter for immunization: Secondary | ICD-10-CM | POA: Diagnosis not present

## 2022-03-26 DIAGNOSIS — Z8619 Personal history of other infectious and parasitic diseases: Secondary | ICD-10-CM

## 2022-03-26 DIAGNOSIS — H0015 Chalazion left lower eyelid: Secondary | ICD-10-CM

## 2022-03-26 DIAGNOSIS — R142 Eructation: Secondary | ICD-10-CM | POA: Diagnosis not present

## 2022-03-26 DIAGNOSIS — H00019 Hordeolum externum unspecified eye, unspecified eyelid: Secondary | ICD-10-CM | POA: Diagnosis not present

## 2022-03-26 DIAGNOSIS — N898 Other specified noninflammatory disorders of vagina: Secondary | ICD-10-CM

## 2022-03-26 MED ORDER — ERYTHROMYCIN 5 MG/GM OP OINT: 1.0000 | TOPICAL_OINTMENT | Freq: Every day | OPHTHALMIC | 0 refills | Status: DC

## 2022-03-26 NOTE — Patient Instructions (Signed)
Simethicone Capsules or Tablets What is this medication? SIMETHICONE (sye METH i kone) treats the symptoms of gas, such as fullness, pressure, and bloating. It works by making it easier to pass gas through your digestive tract and exit your body. This medicine may be used for other purposes; ask your health care provider or pharmacist if you have questions. COMMON BRAND NAME(S): Gas Free, Gas Relief, Gas-X, Gas-X Extra Strength, Gas-X Ultra Strength, GasAid, Mylanta Gas, Phazyme What should I tell my care team before I take this medication? They need to know if you have any of these conditions: An unusual or allergic reaction to simethicone, other medications, foods, dyes, or preservatives Pregnant or trying to get pregnant Breast-feeding How should I use this medication? Take this medication by mouth with a glass of water. Follow the directions on the label or those given to you by your care team. Do not take your medication more often than directed. Talk to your care team about the use of this medication in children. Special care may be needed. While this medication may be used in children as young as 12 years for selected conditions, precautions do apply. Overdosage: If you think you have taken too much of this medicine contact a poison control center or emergency room at once. NOTE: This medicine is only for you. Do not share this medicine with others. What if I miss a dose? This does not apply. You will only use this medication as needed for gas pain. Do not use double or extra doses. What may interact with this medication? Interactions are not expected. This list may not describe all possible interactions. Give your health care provider a list of all the medicines, herbs, non-prescription drugs, or dietary supplements you use. Also tell them if you smoke, drink alcohol, or use illegal drugs. Some items may interact with your medicine. What should I watch for while using this  medication? Tell your care team if your symptoms get worse, or if you have severe pain, diarrhea, constipation, or blood in your stool. These could be signs of a more serious condition. What side effects may I notice from receiving this medication? Side effects that you should report to your care team as soon as possible: Allergic reactions--skin rash, itching, hives, swelling of the face, lips, tongue, or throat This list may not describe all possible side effects. Call your doctor for medical advice about side effects. You may report side effects to FDA at 1-800-FDA-1088. Where should I keep my medication? Keep out of the reach of children. Store at room temperature between 15 and 30 degrees C (59 and 86 degrees F). Keep container tightly closed. Throw away any unused medication after the expiration date. NOTE: This sheet is a summary. It may not cover all possible information. If you have questions about this medicine, talk to your doctor, pharmacist, or health care provider.  2023 Elsevier/Gold Standard (2020-10-25 00:00:00)

## 2022-03-26 NOTE — Progress Notes (Signed)
Patient ID: [name], female   DOB: 1986-07-03, 36 y.o.   MRN: [id number]   [name], is a 36 y.o. female  [id number]  [id number]  DOB - [birth year]  Chief Complaint  Patient presents with   Vaginal Discharge    Patient stating to having thick bright yellow discharge.   Stye    On both eyes   Medication Refill       Subjective:   [name] is a 36 y.o. female here today for several issues.   -had a sty in her L lower eyelid and has a little bump that won't go away.  And has tender bump in R outer eyelid now.  No drainage.   eye not tender.  Vision not affected.    Vaginal discharge after recent treatment for +chlamydia.    Also c/o a lot of belching and would like something to help with it.  No N/V/D.    No problems updated.  ALLERGIES: No Known Allergies  PAST MEDICAL HISTORY: Past Medical History:  Diagnosis Date   Anemia    Chronic hypertension during pregnancy, antepartum 12/24/2017   [x]  Aspirin 81 mg daily after 12 weeks; discontinue after 36 weeks Current antihypertensives:  None   Baseline and surveillance labs (pulled in from [facility], refresh links as needed)  Lab Results Component Value Date  PLT 353 11/25/2016  CREATININE 0.50 11/24/2017  AST 24 11/25/2016  ALT 17 11/25/2016  PROTCRRATIO 0.30 (H) 12/31/2015   Antenatal Testing CHTN - O10.919  Group I  BP < 140/90, no preecl   Hypertension    Pregnancy induced hypertension     MEDICATIONS AT HOME: Prior to Admission medications   Medication Sig Start Date End Date Taking? Authorizing Provider  erythromycin ophthalmic ointment Place 1 Application into both eyes at bedtime. 03/26/22  Yes 05/26/22, PA-C  ferrous gluconate (FERGON) 324 MG tablet Take 1 tablet (324 mg total) by mouth 2 (two) times daily with a meal. 06/24/21  Yes 06/26/21, MD  fluticasone [facility]) 50 MCG/ACT nasal spray Place 2 sprays into both nostrils daily. 01/08/22  Yes Brenya Taulbee M, PA-C   lisinopril-hydrochlorothiazide (ZESTORETIC) 20-12.5 MG tablet Take 1 tablet by mouth daily. 01/08/22  Yes Jinnifer Montejano, 01/10/22, PA-C  doxycycline (VIBRA-TABS) 100 MG tablet Take 1 tablet (100 mg total) by mouth 2 (two) times daily. Patient not taking: Reported on 03/26/2022 01/09/22   01/11/22, PA-C    ROS: Neg resp Neg cardiac Neg GU Neg MS Neg psych Neg neuro  Objective:   Vitals:   03/26/22 1417  BP: 117/80  Pulse: 91  SpO2: 94%  Weight: 212 lb 9.6 oz (96.4 kg)  Height: 5\' 2"  (1.575 m)   Exam General appearance : Awake, alert, not in any distress. Speech Clear. Not toxic looking HEENT: Atraumatic and Normocephalic, pupils equally reactive to light and accomodation, EOM intact.  R outer canthus with small and tender hordeolum.  Chalazion lower lid L eye Neck: Supple, no JVD. No cervical lymphadenopathy.  Chest: Good air entry bilaterally, CTAB.  No rales/rhonchi/wheezing CVS: S1 S2 regular, no murmurs.  Extremities: B/L Lower Ext shows no edema, both legs are warm to touch Neurology: Awake alert, and oriented X 3, CN II-XII intact, Non focal Skin: No Rash  Data Review Lab Results  Component Value Date   HGBA1C 4.7 (L) 06/24/2021    Assessment & Plan   1. Hordeolum, unspecified hordeolum type, unspecified laterality - erythromycin ophthalmic ointment; Place 1  Application into both eyes at bedtime.  Dispense: 3.5 g; Refill: 0  2. Vaginal discharge - Cervicovaginal ancillary only  3. H/O chlamydia infection Safe sex practices reviewed - Cervicovaginal ancillary only  4. Chalazion of left lower eyelid Hopefully will go down-she says she is ok with it if it doesn't get larger.  Defer referral.   - erythromycin ophthalmic ointment; Place 1 Application into both eyes at bedtime.  Dispense: 3.5 g; Refill: 0  5. Belching Simethicone per OTC pkg instructions  1st dose gardisil today  2 months for 2nd dose gardisil  Return in about 6 months (around 09/26/2022)  for PCP for chronic conditions. 3rd dose gardisil  The patient was given clear instructions to go to ER or return to medical center if symptoms don't improve, worsen or new problems develop. The patient verbalized understanding. The patient was told to call to get lab results if they haven't heard anything in the next week.      Georgian Co, PA-C [facility] [place], [place] [phone]   03/26/2022, 2:45 PM

## 2022-03-27 LAB — CERVICOVAGINAL ANCILLARY ONLY
Bacterial Vaginitis (gardnerella): NEGATIVE
Candida Glabrata: NEGATIVE
Candida Vaginitis: NEGATIVE
Chlamydia: NEGATIVE
Comment: NEGATIVE
Comment: NEGATIVE
Comment: NEGATIVE
Comment: NEGATIVE
Comment: NEGATIVE
Comment: NORMAL
Neisseria Gonorrhea: NEGATIVE
Trichomonas: NEGATIVE

## 2022-05-12 ENCOUNTER — Ambulatory Visit: Payer: Medicaid Other | Admitting: Critical Care Medicine

## 2022-05-12 NOTE — Progress Notes (Unsigned)
New Patient Office Visit  Subjective:  Patient ID: [name], female    DOB: 05-26-1986  Age: 36 y.o. MRN: [id number]  CC:  No chief complaint on file.   HPI ANIYIA RANE presents for for primary care to establish.  Patient was seen in mobile medicine because of an eye infection given topical eyedrops previously.  This is resolved this issue.  She continues to have difficulty with a rash in the groin.  She is due a mammogram and Pap smear and needs support for this.  Patient does have increased weight gain  Patient has hypertension does need refills on the lisinopril HCT and iron supplements  Past Medical History:  Diagnosis Date   Anemia    Chronic hypertension during pregnancy, antepartum 12/24/2017   [x]  Aspirin 81 mg daily after 12 weeks; discontinue after 36 weeks Current antihypertensives:  None   Baseline and surveillance labs (pulled in from [facility], refresh links as needed)  Lab Results Component Value Date  PLT 353 11/25/2016  CREATININE 0.50 11/24/2017  AST 24 11/25/2016  ALT 17 11/25/2016  PROTCRRATIO 0.30 (H) 12/31/2015   Antenatal Testing CHTN - O10.919  Group I  BP < 140/90, no preecl   Hypertension    Pregnancy induced hypertension     Past Surgical History:  Procedure Laterality Date   TUBAL LIGATION Bilateral 07/20/2018   Procedure: POST PARTUM TUBAL LIGATION;  Surgeon: 07/22/2018, DO;  Location: [facility];  Service: Gynecology;  Laterality: Bilateral;    Family History  Problem Relation Age of Onset   Stroke Mother    Hypertension Mother    Hypertension Father     Social History   Socioeconomic History   Marital status: Single    Spouse name: Not on file   Number of children: Not on file   Years of education: Not on file   Highest education level: Not on file  Occupational History   Not on file  Tobacco Use   Smoking status: Former    Types: Cigarettes    Quit date: 03/29/2013    Years since quitting: 9.1   Smokeless tobacco:  Never  Vaping Use   Vaping Use: Never used  Substance and Sexual Activity   Alcohol use: Yes    Comment: occ   Drug use: No   Sexual activity: Yes    Birth control/protection: None  Other Topics Concern   Not on file  Social History Narrative   Not on file   Social Determinants of Health   Financial Resource Strain: Not on file  Food Insecurity: No Food Insecurity (08/31/2018)   Hunger Vital Sign    Worried About Running Out of Food in the Last Year: Never true    Ran Out of Food in the Last Year: Never true  Transportation Needs: No Transportation Needs (08/31/2018)   PRAPARE - 10/30/2018 (Medical): No    Lack of Transportation (Non-Medical): No  Physical Activity: Not on file  Stress: Not on file  Social Connections: Not on file  Intimate Partner Violence: Not on file    ROS Review of Systems  Constitutional: Negative.   HENT: Negative.  Negative for ear pain, postnasal drip, rhinorrhea, sinus pressure, sore throat, trouble swallowing and voice change.   Eyes: Negative.   Respiratory: Negative.  Negative for apnea, cough, choking, chest tightness, shortness of breath, wheezing and stridor.   Cardiovascular: Negative.  Negative for chest pain, palpitations and leg  swelling.  Gastrointestinal: Negative.  Negative for abdominal distention, abdominal pain, nausea and vomiting.  Genitourinary: Negative.   Musculoskeletal: Negative.  Negative for arthralgias and myalgias.  Skin:  Positive for rash.  Allergic/Immunologic: Negative.  Negative for environmental allergies and food allergies.  Neurological: Negative.  Negative for dizziness, syncope, weakness and headaches.  Hematological: Negative.  Negative for adenopathy. Does not bruise/bleed easily.  Psychiatric/Behavioral: Negative.  Negative for agitation and sleep disturbance. The patient is not nervous/anxious.     Objective:   Today's Vitals: There were no vitals taken for this  visit.  Physical Exam Vitals reviewed.  Constitutional:      Appearance: Normal appearance. She is well-developed. She is obese. She is not diaphoretic.  HENT:     Head: Normocephalic and atraumatic.     Nose: No nasal deformity, septal deviation, mucosal edema or rhinorrhea.     Right Sinus: No maxillary sinus tenderness or frontal sinus tenderness.     Left Sinus: No maxillary sinus tenderness or frontal sinus tenderness.     Mouth/Throat:     Pharynx: No oropharyngeal exudate.  Eyes:     General: No scleral icterus.    Conjunctiva/sclera: Conjunctivae normal.     Pupils: Pupils are equal, round, and reactive to light.  Neck:     Thyroid: No thyromegaly.     Vascular: No carotid bruit or JVD.     Trachea: Trachea normal. No tracheal tenderness or tracheal deviation.  Cardiovascular:     Rate and Rhythm: Normal rate and regular rhythm.     Chest Wall: PMI is not displaced.     Pulses: Normal pulses. No decreased pulses.     Heart sounds: Normal heart sounds, S1 normal and S2 normal. Heart sounds not distant. No murmur heard.    No systolic murmur is present.     No diastolic murmur is present.     No friction rub. No gallop. No S3 or S4 sounds.  Pulmonary:     Effort: No tachypnea, accessory muscle usage or respiratory distress.     Breath sounds: No stridor. No decreased breath sounds, wheezing, rhonchi or rales.  Chest:     Chest wall: No tenderness.  Abdominal:     General: Bowel sounds are normal. There is no distension.     Palpations: Abdomen is soft. Abdomen is not rigid.     Tenderness: There is no abdominal tenderness. There is no guarding or rebound.  Genitourinary:    Comments: Groin rash compatible with Candida Musculoskeletal:        General: Normal range of motion.     Cervical back: Normal range of motion and neck supple. No edema, erythema or rigidity. No muscular tenderness. Normal range of motion.  Lymphadenopathy:     Head:     Right side of head: No  submental or submandibular adenopathy.     Left side of head: No submental or submandibular adenopathy.     Cervical: No cervical adenopathy.  Skin:    General: Skin is warm and dry.     Coloration: Skin is not pale.     Findings: No rash.     Nails: There is no clubbing.  Neurological:     Mental Status: She is alert and oriented to person, place, and time.     Sensory: No sensory deficit.  Psychiatric:        Speech: Speech normal.        Behavior: Behavior normal.     Assessment &  Plan:   Problem List Items Addressed This Visit   None  Outpatient Encounter Medications as of 05/13/2022  Medication Sig   doxycycline (VIBRA-TABS) 100 MG tablet Take 1 tablet (100 mg total) by mouth 2 (two) times daily. (Patient not taking: Reported on 03/26/2022)   erythromycin ophthalmic ointment Place 1 Application into both eyes at bedtime.   ferrous gluconate (FERGON) 324 MG tablet Take 1 tablet (324 mg total) by mouth 2 (two) times daily with a meal.   fluticasone (FLONASE) 50 MCG/ACT nasal spray Place 2 sprays into both nostrils daily.   lisinopril-hydrochlorothiazide (ZESTORETIC) 20-12.5 MG tablet Take 1 tablet by mouth daily.   No facility-administered encounter medications on file as of 05/13/2022.   Patient declined the flu shot at this visit we will arrange for her to have a mammogram and Pap smear through the cancer control program Follow-up: No follow-ups on file.   Asencion Noble, MD  Review of Systems  Constitutional: Negative.   HENT: Negative.  Negative for ear pain, postnasal drip, rhinorrhea, sinus pressure, sore throat, trouble swallowing and voice change.   Eyes: Negative.   Respiratory: Negative.  Negative for apnea, cough, choking, chest tightness, shortness of breath, wheezing and stridor.   Cardiovascular: Negative.  Negative for chest pain, palpitations and leg swelling.  Gastrointestinal: Negative.  Negative for abdominal distention, abdominal pain, nausea and  vomiting.  Genitourinary: Negative.   Musculoskeletal: Negative.  Negative for arthralgias and myalgias.  Skin:  Positive for rash.  Neurological: Negative.  Negative for dizziness, syncope, weakness and headaches.  Endo/Heme/Allergies:  Negative for environmental allergies and adenopathy. Does not bruise/bleed easily.  Psychiatric/Behavioral: Negative.  Negative for agitation and sleep disturbance. The patient is not nervous/anxious.    HPI

## 2022-05-13 ENCOUNTER — Other Ambulatory Visit (HOSPITAL_COMMUNITY)
Admission: RE | Admit: 2022-05-13 | Discharge: 2022-05-13 | Disposition: A | Discharge status: Home / Self Care | Payer: Commercial Managed Care - PPO | Source: Ambulatory Visit | Attending: Critical Care Medicine | Admitting: Critical Care Medicine

## 2022-05-13 ENCOUNTER — Encounter: Payer: Self-pay | Admitting: Critical Care Medicine

## 2022-05-13 ENCOUNTER — Ambulatory Visit: Payer: Commercial Managed Care - PPO | Attending: Critical Care Medicine | Admitting: Critical Care Medicine

## 2022-05-13 VITALS — BP 112/75 | HR 78 | Ht 62.5 in | Wt 209.8 lb

## 2022-05-13 DIAGNOSIS — J0100 Acute maxillary sinusitis, unspecified: Secondary | ICD-10-CM

## 2022-05-13 DIAGNOSIS — Z23 Encounter for immunization: Secondary | ICD-10-CM | POA: Diagnosis not present

## 2022-05-13 DIAGNOSIS — B379 Candidiasis, unspecified: Secondary | ICD-10-CM

## 2022-05-13 DIAGNOSIS — I10 Essential (primary) hypertension: Secondary | ICD-10-CM | POA: Diagnosis not present

## 2022-05-13 DIAGNOSIS — D649 Anemia, unspecified: Secondary | ICD-10-CM | POA: Diagnosis not present

## 2022-05-13 DIAGNOSIS — N898 Other specified noninflammatory disorders of vagina: Secondary | ICD-10-CM | POA: Diagnosis present

## 2022-05-13 MED ORDER — FLUTICASONE PROPIONATE 50 MCG/ACT NA SUSP: 2.0000 | Freq: Every day | NASAL | 6 refills | Status: DC

## 2022-05-13 MED ORDER — NYSTATIN 100000 UNIT/GM EX POWD: 1.0000 | Freq: Three times a day (TID) | CUTANEOUS | 0 refills | Status: DC

## 2022-05-13 MED ORDER — LISINOPRIL-HYDROCHLOROTHIAZIDE 20-12.5 MG PO TABS: 1.0000 | ORAL_TABLET | Freq: Every day | ORAL | 3 refills | Status: DC

## 2022-05-13 MED ORDER — FERROUS GLUCONATE 324 (38 FE) MG PO TABS: 324.0000 mg | ORAL_TABLET | Freq: Two times a day (BID) | ORAL | 3 refills | Status: DC

## 2022-05-13 NOTE — Assessment & Plan Note (Signed)
Continue with iron supplement 

## 2022-05-13 NOTE — Assessment & Plan Note (Signed)
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

## 2022-05-13 NOTE — Patient Instructions (Signed)
Nystatin powder refilled  Cervical vaginal swab obtained  Refills and other medications sent to your pharmacy  We will call you with the results of cervical vaginal swab  Your second HPV vaccine was given your third to be scheduled in 6 weeks  Return to Dr. Joya Gaskins 3 months

## 2022-05-13 NOTE — Assessment & Plan Note (Signed)
Obtain cervical vaginal screen

## 2022-05-13 NOTE — Assessment & Plan Note (Signed)
Blood pressure well controlled continue current lisinopril HCT

## 2022-05-15 ENCOUNTER — Other Ambulatory Visit: Payer: Self-pay | Admitting: Critical Care Medicine

## 2022-05-15 DIAGNOSIS — B3731 Acute candidiasis of vulva and vagina: Secondary | ICD-10-CM

## 2022-05-15 LAB — CERVICOVAGINAL ANCILLARY ONLY
Bacterial Vaginitis (gardnerella): NEGATIVE
Candida Glabrata: NEGATIVE
Candida Vaginitis: POSITIVE — AB
Chlamydia: NEGATIVE
Comment: NEGATIVE
Comment: NEGATIVE
Comment: NEGATIVE
Comment: NEGATIVE
Comment: NEGATIVE
Comment: NORMAL
Neisseria Gonorrhea: NEGATIVE
Trichomonas: NEGATIVE

## 2022-05-15 MED ORDER — FLUCONAZOLE 150 MG PO TABS: 150.0000 mg | ORAL_TABLET | Freq: Once | ORAL | 0 refills | Status: AC

## 2022-05-15 NOTE — Progress Notes (Signed)
Let pt know she has yeast in vagina  single dose diflucan sent to pharmacy her [facility]

## 2022-05-16 ENCOUNTER — Telehealth: Payer: Self-pay

## 2022-05-16 ENCOUNTER — Other Ambulatory Visit: Payer: Self-pay

## 2022-05-16 DIAGNOSIS — D649 Anemia, unspecified: Secondary | ICD-10-CM

## 2022-05-16 DIAGNOSIS — J0100 Acute maxillary sinusitis, unspecified: Secondary | ICD-10-CM

## 2022-05-16 DIAGNOSIS — I10 Essential (primary) hypertension: Secondary | ICD-10-CM

## 2022-05-16 MED ORDER — FERROUS GLUCONATE 324 (38 FE) MG PO TABS: 324.0000 mg | ORAL_TABLET | Freq: Two times a day (BID) | ORAL | 3 refills | Status: AC

## 2022-05-16 MED ORDER — FLUTICASONE PROPIONATE 50 MCG/ACT NA SUSP: 2.0000 | Freq: Every day | NASAL | 6 refills | Status: DC

## 2022-05-16 MED ORDER — LISINOPRIL-HYDROCHLOROTHIAZIDE 20-12.5 MG PO TABS: 1.0000 | ORAL_TABLET | Freq: Every day | ORAL | 3 refills | Status: DC

## 2022-05-16 MED ORDER — NYSTATIN 100000 UNIT/GM EX POWD: 1.0000 | Freq: Three times a day (TID) | CUTANEOUS | 0 refills | Status: DC

## 2022-05-16 NOTE — Telephone Encounter (Signed)
-----   Message from Elsie Stain, MD sent at 05/15/2022  8:44 AM EDT ----- Let pt know she has yeast in vagina  single dose diflucan sent to pharmacy her [facility]

## 2022-05-16 NOTE — Telephone Encounter (Signed)
Pt was called and is aware of results, DOB was confirmed.  ?

## 2022-05-27 ENCOUNTER — Ambulatory Visit: Payer: Commercial Managed Care - PPO

## 2022-08-07 ENCOUNTER — Telehealth: Payer: Self-pay | Admitting: Critical Care Medicine

## 2022-08-07 ENCOUNTER — Other Ambulatory Visit: Payer: Self-pay | Admitting: Critical Care Medicine

## 2022-08-07 MED ORDER — FLUCONAZOLE 150 MG PO TABS: ORAL_TABLET | ORAL | 0 refills | Status: DC

## 2022-08-07 NOTE — Telephone Encounter (Signed)
[name] sent oral diflucan in for this patients yeast infection  please create phone not in future

## 2022-08-07 NOTE — Telephone Encounter (Signed)
The prescription is clear  take two once then one daily til gone  I changed the dose to 100mg  the rx somehow auto defaulted to 150

## 2022-08-07 NOTE — Telephone Encounter (Signed)
Unclear if patient requires more than 1 dose/day supply. Will forward to PCP for clarification.

## 2022-08-08 NOTE — Telephone Encounter (Signed)
Called patient and left voicemail.

## 2022-08-14 ENCOUNTER — Ambulatory Visit: Payer: Self-pay

## 2022-08-14 NOTE — Telephone Encounter (Signed)
Patient states that she thinks she has a cold and it is getting better but still has a cough.     Chief Complaint: Cough, productive, clear . Has a stye on left eye  Symptoms: Above Frequency: 2 weeks ago Pertinent Negatives: Patient denies fever Disposition: [] ED /[] Urgent Care (no appt availability in office) / [] Appointment(In office/virtual)/ []  [place] [facility]/ [] Home Care/ [] Refused Recommended Disposition /[] [facility]/ [x]  Follow-up with PCP Additional Notes: Pt. Asking for cough medication and medicated eye drops.Please advise pt.  Answer Assessment - Initial Assessment Questions 1. ONSET: "When did the cough begin?"      2 weeks ago 2. SEVERITY: "How bad is the cough today?"      Moderate 3. SPUTUM: "Describe the color of your sputum" (none, dry cough; clear, white, yellow, green)     Clear 4. HEMOPTYSIS: "Are you coughing up any blood?" If so ask: "How much?" (flecks, streaks, tablespoons, etc.)     No 5. DIFFICULTY BREATHING: "Are you having difficulty breathing?" If Yes, ask: "How bad is it?" (e.g., mild, moderate, severe)    - MILD: No SOB at rest, mild SOB with walking, speaks normally in sentences, can lie down, no retractions, pulse < 100.    - MODERATE: SOB at rest, SOB with minimal exertion and prefers to sit, cannot lie down flat, speaks in phrases, mild retractions, audible wheezing, pulse 100-120.    - SEVERE: Very SOB at rest, speaks in single words, struggling to breathe, sitting hunched forward, retractions, pulse > 120      Mild 6. FEVER: "Do you have a fever?" If Yes, ask: "What is your temperature, how was it measured, and when did it start?"     No 7. CARDIAC HISTORY: "Do you have any history of heart disease?" (e.g., heart attack, congestive heart failure)      No 8. LUNG HISTORY: "Do you have any history of lung disease?"  (e.g., pulmonary embolus, asthma, emphysema)     No 9. PE RISK FACTORS: "Do you have a history of blood clots?"  (or: recent major surgery, recent prolonged travel, bedridden)     No 10. OTHER SYMPTOMS: "Do you have any other symptoms?" (e.g., runny nose, wheezing, chest pain)       Wheezing 11. PREGNANCY: "Is there any chance you are pregnant?" "When was your last menstrual period?"       No 12. TRAVEL: "Have you traveled out of the country in the last month?" (e.g., travel history, exposures)       No  Protocols used: Cough - Acute Productive-A-AH

## 2022-09-09 ENCOUNTER — Encounter (HOSPITAL_BASED_OUTPATIENT_CLINIC_OR_DEPARTMENT_OTHER): Payer: Self-pay

## 2022-09-09 ENCOUNTER — Emergency Department (HOSPITAL_BASED_OUTPATIENT_CLINIC_OR_DEPARTMENT_OTHER)
Admission: EM | Admit: 2022-09-09 | Discharge: 2022-09-09 | Disposition: A | Discharge status: Home / Self Care | Payer: No Typology Code available for payment source | Attending: Emergency Medicine | Admitting: Emergency Medicine

## 2022-09-09 ENCOUNTER — Emergency Department (HOSPITAL_BASED_OUTPATIENT_CLINIC_OR_DEPARTMENT_OTHER): Payer: Commercial Managed Care - PPO

## 2022-09-09 ENCOUNTER — Other Ambulatory Visit: Payer: Self-pay

## 2022-09-09 DIAGNOSIS — M25562 Pain in left knee: Secondary | ICD-10-CM | POA: Diagnosis not present

## 2022-09-09 DIAGNOSIS — Z79899 Other long term (current) drug therapy: Secondary | ICD-10-CM | POA: Diagnosis not present

## 2022-09-09 DIAGNOSIS — M25561 Pain in right knee: Secondary | ICD-10-CM | POA: Diagnosis present

## 2022-09-09 DIAGNOSIS — W109XXA Fall (on) (from) unspecified stairs and steps, initial encounter: Secondary | ICD-10-CM | POA: Diagnosis not present

## 2022-09-09 DIAGNOSIS — I10 Essential (primary) hypertension: Secondary | ICD-10-CM | POA: Insufficient documentation

## 2022-09-09 MED ORDER — NAPROXEN 500 MG PO TABS: 500.0000 mg | ORAL_TABLET | Freq: Two times a day (BID) | ORAL | 0 refills | Status: DC

## 2022-09-09 NOTE — ED Triage Notes (Signed)
Pt c/o bilateral knee pain following a fall down 3-4 steps x3 days ago.  Pain score 9/10.  Pt reports using ice and OTC medications w/o relief.  Pt was able to ambulate into department wo assistance.

## 2022-09-09 NOTE — Discharge Instructions (Addendum)
Your imaging studies were reassuring today.  Please take tylenol/ibuprofen for pain. I recommend close follow-up with ortho for reevaluation.  Please do not hesitate to return to emergency department if worrisome signs symptoms we discussed become apparent.

## 2022-09-09 NOTE — ED Provider Notes (Signed)
[place] EMERGENCY DEPARTMENT Provider Note   CSN: [id number] Arrival date & time: 09/09/22  1227     History  Chief Complaint  Patient presents with   Fall   Knee Pain    [name] is a 37 y.o. female with a past medical history of hypertension presenting today for evaluation of bilateral knee pain.  Patient reports she fell off 3-4 steps 3 days ago, landed on her knees.  Denies hitting head or LOC.  She has tried Tylenol/ibuprofen, apply ice heat packs with minimal improvement.  She reports mild swelling of the right knee. No fever. She is able to ambulate in the  last few days with some limbing.    Fall  Knee Pain   Past Medical History:  Diagnosis Date   Anemia    Chronic hypertension during pregnancy, antepartum 12/24/2017   [x]  Aspirin 81 mg daily after 12 weeks; discontinue after 36 weeks Current antihypertensives:  None   Baseline and surveillance labs (pulled in from [facility], refresh links as needed)  Lab Results Component Value Date  PLT 353 11/25/2016  CREATININE 0.50 11/24/2017  AST 24 11/25/2016  ALT 17 11/25/2016  PROTCRRATIO 0.30 (H) 12/31/2015   Antenatal Testing CHTN - O10.919  Group I  BP < 140/90, no preecl   Hypertension    Pregnancy induced hypertension    Past Surgical History:  Procedure Laterality Date   TUBAL LIGATION Bilateral 07/20/2018   Procedure: POST PARTUM TUBAL LIGATION;  Surgeon: Truett Mainland, DO;  Location: [place];  Service: Gynecology;  Laterality: Bilateral;     Home Medications Prior to Admission medications   Medication Sig Start Date End Date Taking? Authorizing Provider  naproxen (NAPROSYN) 500 MG tablet Take 1 tablet (500 mg total) by mouth 2 (two) times daily. 09/09/22  Yes Rex Kras, PA  ferrous gluconate (FERGON) 324 MG tablet Take 1 tablet (324 mg total) by mouth 2 (two) times daily with a meal. 05/16/22   Elsie Stain, MD  fluconazole (DIFLUCAN) 100 MG tablet Take two once then one daily until  gone 08/07/22   Elsie Stain, MD  fluticasone [facility]) 50 MCG/ACT nasal spray Place 2 sprays into both nostrils daily. 05/16/22   Elsie Stain, MD  lisinopril-hydrochlorothiazide (ZESTORETIC) 20-12.5 MG tablet Take 1 tablet by mouth daily. 05/16/22   Elsie Stain, MD      Allergies    Patient has no known allergies.    Review of Systems   Review of Systems Negative except as per HPI.' Physical Exam Updated Vital Signs BP 116/69 (BP Location: Right Arm)   Pulse 78   Temp 98.6 F (37 C)   Resp 20   Ht 5\' 2"  (1.575 m)   Wt 94.3 kg   LMP 08/24/2022   SpO2 97%   BMI 38.04 kg/m  Physical Exam Vitals and nursing note reviewed.  Constitutional:      Appearance: Normal appearance.  HENT:     Head: Normocephalic and atraumatic.     Mouth/Throat:     Mouth: Mucous membranes are moist.  Eyes:     General: No scleral icterus. Cardiovascular:     Rate and Rhythm: Normal rate and regular rhythm.     Pulses: Normal pulses.     Heart sounds: Normal heart sounds.  Pulmonary:     Effort: Pulmonary effort is normal.     Breath sounds: Normal breath sounds.  Abdominal:     General: Abdomen is flat.  Palpations: Abdomen is soft.     Tenderness: There is no abdominal tenderness.  Musculoskeletal:        General: No deformity.     Comments: Tenderness to palpation to bilateral knees.  Skin:    General: Skin is warm.     Findings: No rash.  Neurological:     General: No focal deficit present.     Mental Status: She is alert.  Psychiatric:        Mood and Affect: Mood normal.     ED Results / Procedures / Treatments   Labs (all labs ordered are listed, but only abnormal results are displayed) Labs Reviewed - No data to display  EKG None  Radiology DG Knee Complete 4 Views Right  Result Date: 09/09/2022 CLINICAL DATA:  Bilateral knee pain, fall down stairs EXAM: RIGHT KNEE - COMPLETE 4+ VIEW COMPARISON:  None Available. FINDINGS: No fracture or acute bony  findings. No knee effusion. Small ossicle along the distal patellar tendon/tibial tubercle, potentially a manifestation of remote Osgood-Schlatter disease. IMPRESSION: 1. No acute findings. 2. Small ossicle along the distal patellar tendon/tibial tubercle, potentially a manifestation of remote Osgood-Schlatter disease. Electronically Signed   By: Van Clines M.D.   On: 09/09/2022 14:11   DG Knee Complete 4 Views Left  Result Date: 09/09/2022 CLINICAL DATA:  Bilateral knee pain after fall EXAM: LEFT KNEE - COMPLETE 4+ VIEW COMPARISON:  02/11/2007 FINDINGS: No fracture or acute bony findings identified. No significant knee effusion. IMPRESSION: 1. No significant abnormality identified. Electronically Signed   By: Van Clines M.D.   On: 09/09/2022 13:52    Procedures Procedures    Medications Ordered in ED Medications - No data to display  ED Course/ Medical Decision Making/ A&P                             Medical Decision Making Amount and/or Complexity of Data Reviewed Radiology: ordered.  Risk Prescription drug management.   This patient presents to the ED for bilateral knee pain, this involves an extensive number of treatment options, and is a complaint that carries with a high risk of complications and morbidity.  The differential diagnosis includes fracture, dislocation, tendon ligament injury.  This is not an exhaustive list.  Imaging studies: I ordered imaging studies. I personally reviewed, interpreted imaging and agree with the radiologist's interpretations. The results include: X-rays show no evidence of acute bony abnormality of the knees.  Problem list/ ED course/ Critical interventions/ Medical management: HPI: See above Vital signs within normal range and stable throughout visit. Laboratory/imaging studies significant for: See above. On physical examination, patient is afebrile and appears in no acute distress.  Based on patient's clinical presentations and  laboratory/imaging studies I suspect musculoskeletal pain. Low suspicion for fracture as patient is able to bear weight. Knee braces ordered. Advised patient to take Tylenol/ibuprofen/naproxen, apply ice/heat for pain.  Follow-up with Ortho for further evaluation and management.  Return to the ER if new or worsening symptoms.. I have reviewed the patient home medicines and have made adjustments as needed.  Cardiac monitoring/EKG: The patient was maintained on a cardiac monitor.  I personally reviewed and interpreted the cardiac monitor which showed an underlying rhythm of: sinus rhythm.  Additional history obtained: External records from outside source obtained and reviewed including: Chart review including previous notes, labs, imaging.  Disposition Continued outpatient therapy. Follow-up with ortho recommended for reevaluation of symptoms. Treatment plan discussed  with patient.  Pt acknowledged understanding was agreeable to the plan. Worrisome signs and symptoms were discussed with patient, and patient acknowledged understanding to return to the ED if they noticed these signs and symptoms. Patient was stable upon discharge.   This chart was dictated using voice recognition software.  Despite best efforts to proofread,  errors can occur which can change the documentation meaning.          Final Clinical Impression(s) / ED Diagnoses Final diagnoses:  Acute pain of both knees    Rx / DC Orders ED Discharge Orders          Ordered    naproxen (NAPROSYN) 500 MG tablet  2 times daily        09/09/22 1710              Jeanelle Malling, [place] 09/09/22 2314    Pricilla Loveless, MD 09/10/22 1712

## 2022-09-17 ENCOUNTER — Encounter: Payer: Self-pay | Admitting: Physician Assistant

## 2022-09-17 ENCOUNTER — Ambulatory Visit: Payer: Self-pay

## 2022-09-17 ENCOUNTER — Ambulatory Visit: Payer: Commercial Managed Care - PPO | Attending: Physician Assistant | Admitting: Physician Assistant

## 2022-09-17 VITALS — BP 110/74 | HR 82 | Wt 207.2 lb

## 2022-09-17 DIAGNOSIS — Z09 Encounter for follow-up examination after completed treatment for conditions other than malignant neoplasm: Secondary | ICD-10-CM | POA: Diagnosis not present

## 2022-09-17 DIAGNOSIS — W108XXD Fall (on) (from) other stairs and steps, subsequent encounter: Secondary | ICD-10-CM

## 2022-09-17 DIAGNOSIS — S8001XD Contusion of right knee, subsequent encounter: Secondary | ICD-10-CM

## 2022-09-17 DIAGNOSIS — M25562 Pain in left knee: Secondary | ICD-10-CM

## 2022-09-17 MED ORDER — NAPROXEN 500 MG PO TABS: 500.0000 mg | ORAL_TABLET | Freq: Two times a day (BID) | ORAL | 0 refills | Status: DC

## 2022-09-17 NOTE — Progress Notes (Signed)
Patient ID: [name], female   DOB: Nov 05, 1985, 37 y.o.   MRN: [id number]   Zetta Stoneman, is a 37 y.o. female  WUJ:[id number]  [id number]  DOB - 07-09-1986  Chief Complaint  Patient presents with   Knee Pain   Medication Refill       Subjective:   [name] is a 37 y.o. female here today s/p fall down stairs that occurred approximately 09/06/2022(seen 09/09/2022 and said it occurred 3 days prior).  She was walking down the stairs with a colleague and dropped her bag.  As she turned to grab her bag, she lost her footing on the next step and fell down 4-5 cement stairs.  Landed on B knees(R>L) and L forearm.  Seen at ED [facility] and xrays showed no fracture.  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  She is wearing a knee sleeve. She is still having significant pain and feels unable to safely drive.  No LOC.  Did not hit head  No problems updated.  ALLERGIES: No Known Allergies  PAST MEDICAL HISTORY: Past Medical History:  Diagnosis Date   Anemia    Chronic hypertension during pregnancy, antepartum 12/24/2017   [x]  Aspirin 81 mg daily after 12 weeks; discontinue after 36 weeks Current antihypertensives:  None   Baseline and surveillance labs (pulled in from [facility], refresh links as needed)  Lab Results Component Value Date  PLT 353 11/25/2016  CREATININE 0.50 11/24/2017  AST 24 11/25/2016  ALT 17 11/25/2016  PROTCRRATIO 0.30 (H) 12/31/2015   Antenatal Testing CHTN - O10.919  Group I  BP < 140/90, no preecl   Hypertension    Pregnancy induced hypertension     MEDICATIONS AT HOME: Prior to Admission medications   Medication Sig Start Date End Date Taking? Authorizing Provider  ferrous gluconate (FERGON) 324 MG tablet Take 1 tablet (324 mg total) by mouth 2 (two) times daily with a meal. 05/16/22  Yes Elsie Stain, MD  fluconazole (DIFLUCAN) 100 MG tablet Take two once then one daily until gone 08/07/22  Yes Elsie Stain, MD  fluticasone [facility]) 50 MCG/ACT  nasal spray Place 2 sprays into both nostrils daily. 05/16/22  Yes Elsie Stain, MD  lisinopril-hydrochlorothiazide (ZESTORETIC) 20-12.5 MG tablet Take 1 tablet by mouth daily. 05/16/22  Yes Elsie Stain, MD  naproxen (NAPROSYN) 500 MG tablet Take 1 tablet (500 mg total) by mouth 2 (two) times daily. Prn pain 09/17/22   Carrol Hougland, Dionne Bucy, PA-C    ROS: Neg HEENT Neg resp Neg cardiac Neg GI Neg GU Neg psych Neg neuro  Objective:   Vitals:   09/17/22 1032  BP: 110/74  Pulse: 82  SpO2: 97%  Weight: 207 lb 3.2 oz (94 kg)   Exam General appearance : Awake, alert, not in any distress. Speech Clear. Not toxic looking.  Ambulates favoring L.  Wearing knee sleeve.   HEENT: Atraumatic and Normocephalic Extremities: B/L Lower Ext shows no edema, both legs are warm to touch R knee-no warmth or ballotment/sign of effusion.  She is tender at inferior aspect of R patella and TTP at suprapatellar region.  Ligaments stable.  L knee unremarkable.   Neurology: Awake alert, and oriented X 3, CN II-XII intact, Non focal Skin: No Rash  Data Review Lab Results  Component Value Date   HGBA1C 4.7 (L) 06/24/2021    Assessment & Plan   1. Contusion of right knee, subsequent encounter - Ambulatory referral to Orthopedic Surgery RICE  therapy.  Note for light duty-sit down work, no driving.  I DID NOT PUT HER PUT OF WORK - naproxen (NAPROSYN) 500 MG tablet; Take 1 tablet (500 mg total) by mouth 2 (two) times daily. Prn pain  Dispense: 60 tablet; Refill: 0  2. Acute pain of left knee - Ambulatory referral to Orthopedic Surgery - naproxen (NAPROSYN) 500 MG tablet; Take 1 tablet (500 mg total) by mouth 2 (two) times daily. Prn pain  Dispense: 60 tablet; Refill: 0  3. Fall down steps, subsequent encounter - Ambulatory referral to Orthopedic Surgery  4. Encounter for examination following treatment at hospital    Return for keep appt in Feb with Dr Joya Gaskins.  The patient was given clear  instructions to go to ER or return to medical center if symptoms don't improve, worsen or new problems develop. The patient verbalized understanding. The patient was told to call to get lab results if they haven't heard anything in the next week.      Freeman Caldron, PA-C [facility] [place], [place]   09/17/2022, 10:53 AM

## 2022-09-17 NOTE — Telephone Encounter (Signed)
   Chief Complaint: [place] 09/07/22, hurt both knees Symptoms: Pain Frequency: 09/07/22 Pertinent Negatives: Patient denies  Disposition: [] ED /[] Urgent Care (no appt availability in office) / [x] Appointment(In office/virtual)/ []  [facility]/ [] Home Care/ [] Refused Recommended Disposition /[] [facility]/ []  Follow-up with PCP Additional Notes:    Reason for Disposition  [1] MODERATE weakness (i.e., interferes with work, school, normal activities) AND [2] new-onset or worsening  Answer Assessment - Initial Assessment Questions 1. MECHANISM: "How did the fall happen?"     Fell at work 2. DOMESTIC VIOLENCE AND ELDER ABUSE SCREENING: "Did you fall because someone pushed you or tried to hurt you?" If Yes, ask: "Are you safe now?"     No 3. ONSET: "When did the fall happen?" (e.g., minutes, hours, or days ago)     09/07/22 4. LOCATION: "What part of the body hit the ground?" (e.g., back, buttocks, head, hips, knees, hands, head, stomach)     Knees 5. INJURY: "Did you hurt (injure) yourself when you fell?" If Yes, ask: "What did you injure? Tell me more about this?" (e.g., body area; type of injury; pain severity)"     Yes 6. PAIN: "Is there any pain?" If Yes, ask: "How bad is the pain?" (e.g., Scale 1-10; or mild,  moderate, severe)   - NONE (0): No pain   - MILD (1-3): Doesn't interfere with normal activities    - MODERATE (4-7): Interferes with normal activities or awakens from sleep    - SEVERE (8-10): Excruciating pain, unable to do any normal activities      7 7. SIZE: For cuts, bruises, or swelling, ask: "How large is it?" (e.g., inches or centimeters)      Bruises 8. PREGNANCY: "Is there any chance you are pregnant?" "When was your last menstrual period?"     No 9. OTHER SYMPTOMS: "Do you have any other symptoms?" (e.g., dizziness, fever, weakness; new onset or worsening).      No 10. CAUSE: "What do you think caused the fall (or falling)?" (e.g., tripped,  dizzy spell)       Tripped  Protocols used: Falls and [place]

## 2022-09-17 NOTE — Patient Instructions (Signed)
RICE Therapy for Routine Care of Injuries Many injuries can be cared for with rest, ice, compression, and elevation (RICE therapy). This includes: Resting the injured body part. Putting ice on the injury. Putting pressure (compression) on the injury. Raising the injured part (elevation). Using RICE therapy can help to lessen pain and swelling. Supplies needed: Ice. Plastic bag. Towel. Elastic bandage. Pillow or pillows to raise your injured body part. How to care for your injury with RICE therapy Rest Try to rest the injured part of your body. You can go back to your normal activities when your doctor says it is okay to do them and when you can do them without pain. If you rest the injury too much, it may not heal as well. Some injuries heal better with early movement instead of resting for too long. Ask your doctor if you should do exercises to help your injury get better. Ice  If told, put ice on the injured area. To do this: Put ice in a plastic bag. Place a towel between your skin and the bag. Leave the ice on for 20 minutes, 2-3 times a day. Take off the ice if your skin turns bright red. This is very important. If you cannot feel pain, heat, or cold, you have a greater risk of damage to the area. Do not put ice on your bare skin. Use ice for as many days as your doctor tells you to use it. Compression Put pressure on the injured area. This can be done with an elastic bandage. If this type of bandage has been put on your injury: Follow instructions on the package the bandage came in about how to use it. Do not wrap the bandage too tightly. Wrap the bandage more loosely if part of your body beyond the bandage is blue, swollen, cold, painful, or loses feeling. Take off the bandage and put it on again every 3-4 hours or as told by your doctor. See your doctor if the bandage seems to make your problems worse.  Elevation Raise the injured area above the level of your heart while you  are sitting or lying down. Follow these instructions at home: If your symptoms get worse or last a long time, make a follow-up appointment with your doctor. You may need to have imaging tests, such as X-rays or an MRI. If you have imaging tests, ask how to get your results when they are ready. Return to your normal activities when your doctor says that it is safe. Keep all follow-up visits. Contact a doctor if: You keep having pain and swelling. Your symptoms get worse. Get help right away if: You have sudden, very bad pain at your injury or lower than your injury. You have redness or more swelling around your injury. You have tingling or numbness at your injury or lower than your injury, and it does not go away when you take off the bandage. Summary Many injuries can be cared for using rest, ice, compression, and elevation (RICE therapy). You can go back to your normal activities when your doctor says it is okay and when you can do them without pain. Put ice on the injured area as told by your doctor. Get help if your symptoms get worse or if you keep having pain and swelling. This information is not intended to replace advice given to you by your health care provider. Make sure you discuss any questions you have with your health care provider. Document Revised: 05/31/2020 Document Reviewed: 05/31/2020   Elsevier Patient Education  2023 Elsevier Inc.  

## 2022-09-22 ENCOUNTER — Ambulatory Visit: Payer: Commercial Managed Care - PPO | Admitting: Sports Medicine

## 2022-09-25 ENCOUNTER — Ambulatory Visit: Payer: Commercial Managed Care - PPO | Attending: Critical Care Medicine

## 2022-09-25 DIAGNOSIS — Z23 Encounter for immunization: Secondary | ICD-10-CM | POA: Diagnosis not present

## 2022-09-25 NOTE — Progress Notes (Signed)
HPV vaccine administered  right deltoid per protocols.  Information sheet given. Patient denies and pain or discomfort at injection site. Tolerated injection well no reaction.

## 2022-09-26 ENCOUNTER — Ambulatory Visit (INDEPENDENT_AMBULATORY_CARE_PROVIDER_SITE_OTHER): Payer: No Typology Code available for payment source | Admitting: Physician Assistant

## 2022-09-26 DIAGNOSIS — M1712 Unilateral primary osteoarthritis, left knee: Secondary | ICD-10-CM | POA: Diagnosis not present

## 2022-09-26 DIAGNOSIS — M1711 Unilateral primary osteoarthritis, right knee: Secondary | ICD-10-CM | POA: Diagnosis not present

## 2022-09-26 DIAGNOSIS — M7651 Patellar tendinitis, right knee: Secondary | ICD-10-CM | POA: Diagnosis not present

## 2022-09-26 NOTE — Progress Notes (Signed)
Office Visit Note   Patient: [name]           Date of Birth: September 29, 1985           MRN: [id number] Visit Date: 09/26/2022              Requested by: Anna Donovan, PA-C [address] [place],  [place] [address] PCP: Anna Stain, MD  Chief Complaint  Patient presents with  . 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  She is a little over 3 weeks falling down some stairs and landing on both of her knees.  She had significant pain and was seen and evaluated.  In emergency care.  X-rays of her knees did not show any acute changes she did have a small ossicle off the tibial tubercle on the right knee which was not acute.  She was told that these were knee contusions and treat this symptomatically.  While the left knee has gotten better the right knee still continues to bother her over the patellar insertion into the tibial tubercle.  She is wearing an brace which helps her.  She has been at light duties at work  Assessment & Plan: Visit Diagnoses: Right knee patella tendinitis   Plan: I recommended obtaining some Voltaren gel and places this on her knee 3 times a day.  She has good patellar function and she is just sore because of her Osgood-Schlatter's prominence makes it more acute.  Have also asked that she engage with physical therapy.  Would give this another 3 to 4 weeks.  Ultimately if she did not get better would refer her to [name] for some shockwave therapy.  Follow-Up Instructions: No follow-ups on file.   Ortho Exam  Patient is alert, oriented, no adenopathy, well-dressed, normal affect, normal respiratory effort. Right knee no effusion no erythema no redness.  No tenderness over the medial or lateral joint line.  Good varus valgus stability.  She is able to maintain a straight leg raise against resistance.  Patella tendon is intact though tender to palpation most acutely at the insertion  on the tibial tubercle.  Quadriceps tendon is intact.  Good anterior drawer with a good endpoint.  Compartments are soft she is neurovascular intact  Imaging: No results found. No images are attached to the encounter.  Labs: Lab Results  Component Value Date   HGBA1C 4.7 (L) 06/24/2021   REPTSTATUS 10/11/2019 FINAL 10/10/2019   CULT (A) 10/10/2019    20,000 COLONIES/mL GROUP B STREP(S.AGALACTIAE)ISOLATED TESTING AGAINST S. AGALACTIAE NOT ROUTINELY PERFORMED DUE TO PREDICTABILITY OF AMP/PEN/VAN SUSCEPTIBILITY. Performed at [facility], [place] [address] [place], [place] [address]      Lab Results  Component Value Date   ALBUMIN 4.7 06/24/2021   ALBUMIN 3.5 03/04/2021   ALBUMIN 3.7 05/01/2020    No results found for: "MG" No results found for: "VD25OH"  No results found for: "PREALBUMIN"    Latest Ref Rng & Units 06/24/2021    4:38 PM 03/04/2021    3:31 PM 05/01/2020   10:59 AM  CBC EXTENDED  WBC 3.4 - 10.8 x10E3/uL 7.8  6.4  7.4   RBC 3.77 - 5.28 x10E6/uL 4.16  3.82  4.07   Hemoglobin 11.1 - 15.9 g/dL 12.4  11.5  12.1   HCT 34.0 - 46.6 % 37.1  35.0  36.2  Platelets 150 - 450 x10E3/uL 389  335  378   NEUT# 1.4 - 7.0 x10E3/uL 3.8  3.6    Lymph# 0.7 - 3.1 x10E3/uL 3.4  2.4       There is no height or weight on file to calculate BMI.  Orders:  Orders Placed This Encounter  Procedures  . Ambulatory referral to Physical Therapy   No orders of the defined types were placed in this encounter.    Procedures: No procedures performed  Clinical Data: No additional findings.  ROS:  All other systems negative, except as noted in the HPI. Review of Systems  All other systems reviewed and are negative.  Objective: Vital Signs: LMP 08/24/2022   Specialty Comments:  No specialty comments available.  PMFS History: Patient Active Problem List   Diagnosis Date Noted  . Patellar tendinitis of right knee 09/26/2022  . Vagina, candidiasis 05/15/2022  . Vaginal  discharge 05/13/2022  . BMI 38.0-38.9,adult 06/24/2021  . Candida albicans infection 06/24/2021  . Anemia 03/20/2021  . Hypertension    Past Medical History:  Diagnosis Date  . Anemia   . Chronic hypertension during pregnancy, antepartum 12/24/2017   [x]  Aspirin 81 mg daily after 12 weeks; discontinue after 36 weeks Current antihypertensives:  None   Baseline and surveillance labs (pulled in from [facility], refresh links as needed)  Lab Results Component Value Date  PLT 353 11/25/2016  CREATININE 0.50 11/24/2017  AST 24 11/25/2016  ALT 17 11/25/2016  PROTCRRATIO 0.30 (H) 12/31/2015   Antenatal Testing CHTN - O10.919  Group I  BP < 140/90, no preecl  . Hypertension   . Pregnancy induced hypertension     Family History  Problem Relation Age of Onset  . Stroke Mother   . Hypertension Mother   . Hypertension Father     Past Surgical History:  Procedure Laterality Date  . TUBAL LIGATION Bilateral 07/20/2018   Procedure: POST PARTUM TUBAL LIGATION;  Surgeon: Anna Mainland, DO;  Location: [facility];  Service: Gynecology;  Laterality: Bilateral;   Social History   Occupational History  . Not on file  Tobacco Use  . Smoking status: Former    Types: Cigarettes    Quit date: 03/29/2013    Years since quitting: 9.5  . Smokeless tobacco: Never  Vaping Use  . Vaping Use: Never used  Substance and Sexual Activity  . Alcohol use: Yes    Comment: occ  . Drug use: No  . Sexual activity: Yes    Birth control/protection: None

## 2022-09-27 NOTE — Progress Notes (Unsigned)
New Patient Office Visit  Subjective:  Patient ID: [name], female    DOB: 1986-08-16  Age: 37 y.o. MRN: [id number]  CC:  No chief complaint on file.   HPI 06/24/21 [name] presents for for primary care to establish.  Patient was seen in mobile medicine because of an eye infection given topical eyedrops previously.  This is resolved this issue.  She continues to have difficulty with a rash in the groin.  She is due a mammogram and Pap smear and needs support for this.  Patient does have increased weight gain  Patient has hypertension does need refills on the lisinopril HCT and iron supplements  9/19 Patient return and on arrival blood pressure is 112/75 and she is maintaining her lisinopril HCT.  She complains of progressive rash in both groin areas which responds to nystatin powder she would like a refill.  Patient also notes that she is having progressive recurrent vaginal discharge.  She was treated in May for this and improved and then worsened.  She also needs her second of 3 HPV vaccines at this visit Past Medical History:  Diagnosis Date   Anemia    Chronic hypertension during pregnancy, antepartum 12/24/2017   [x]  Aspirin 81 mg daily after 12 weeks; discontinue after 36 weeks Current antihypertensives:  None   Baseline and surveillance labs (pulled in from [facility], refresh links as needed)  Lab Results Component Value Date  PLT 353 11/25/2016  CREATININE 0.50 11/24/2017  AST 24 11/25/2016  ALT 17 11/25/2016  PROTCRRATIO 0.30 (H) 12/31/2015   Antenatal Testing CHTN - O10.919  Group I  BP < 140/90, no preecl   Hypertension    Pregnancy induced hypertension     Past Surgical History:  Procedure Laterality Date   TUBAL LIGATION Bilateral 07/20/2018   Procedure: POST PARTUM TUBAL LIGATION;  Surgeon: Truett Mainland, DO;  Location: [place];  Service: Gynecology;  Laterality: Bilateral;    Family History  Problem Relation Age of Onset   Stroke Mother     Hypertension Mother    Hypertension Father     Social History   Socioeconomic History   Marital status: Single    Spouse name: Not on file   Number of children: Not on file   Years of education: Not on file   Highest education level: Not on file  Occupational History   Not on file  Tobacco Use   Smoking status: Former    Types: Cigarettes    Quit date: 03/29/2013    Years since quitting: 9.5   Smokeless tobacco: Never  Vaping Use   Vaping Use: Never used  Substance and Sexual Activity   Alcohol use: Yes    Comment: occ   Drug use: No   Sexual activity: Yes    Birth control/protection: None  Other Topics Concern   Not on file  Social History Narrative   Not on file   Social Determinants of Health   Financial Resource Strain: Not on file  Food Insecurity: No Food Insecurity (08/31/2018)   Hunger Vital Sign    Worried About Running Out of Food in the Last Year: Never true    Ran Out of Food in the Last Year: Never true  Transportation Needs: No Transportation Needs (08/31/2018)   PRAPARE - Hydrologist (Medical): No    Lack of Transportation (Non-Medical): No  Physical Activity: Not on file  Stress: Not on file  Social  Connections: Not on file  Intimate Partner Violence: Not on file    ROS Review of Systems  Constitutional: Negative.   HENT: Negative.  Negative for ear pain, postnasal drip, rhinorrhea, sinus pressure, sore throat, trouble swallowing and voice change.   Eyes: Negative.   Respiratory: Negative.  Negative for apnea, cough, choking, chest tightness, shortness of breath, wheezing and stridor.   Cardiovascular: Negative.  Negative for chest pain, palpitations and leg swelling.  Gastrointestinal: Negative.  Negative for abdominal distention, abdominal pain, nausea and vomiting.  Genitourinary:  Positive for vaginal discharge.  Musculoskeletal: Negative.  Negative for arthralgias and myalgias.  Skin:  Positive for rash.   Allergic/Immunologic: Negative.  Negative for environmental allergies and food allergies.  Neurological: Negative.  Negative for dizziness, syncope, weakness and headaches.  Hematological: Negative.  Negative for adenopathy. Does not bruise/bleed easily.  Psychiatric/Behavioral: Negative.  Negative for agitation and sleep disturbance. The patient is not nervous/anxious.     Objective:   Today's Vitals: LMP 08/24/2022   Physical Exam Vitals reviewed.  Constitutional:      Appearance: Normal appearance. She is well-developed. She is obese. She is not diaphoretic.  HENT:     Head: Normocephalic and atraumatic.     Nose: No nasal deformity, septal deviation, mucosal edema or rhinorrhea.     Right Sinus: No maxillary sinus tenderness or frontal sinus tenderness.     Left Sinus: No maxillary sinus tenderness or frontal sinus tenderness.     Mouth/Throat:     Pharynx: No oropharyngeal exudate.  Eyes:     General: No scleral icterus.    Conjunctiva/sclera: Conjunctivae normal.     Pupils: Pupils are equal, round, and reactive to light.  Neck:     Thyroid: No thyromegaly.     Vascular: No carotid bruit or JVD.     Trachea: Trachea normal. No tracheal tenderness or tracheal deviation.  Cardiovascular:     Rate and Rhythm: Normal rate and regular rhythm.     Chest Wall: PMI is not displaced.     Pulses: Normal pulses. No decreased pulses.     Heart sounds: Normal heart sounds, S1 normal and S2 normal. Heart sounds not distant. No murmur heard.    No systolic murmur is present.     No diastolic murmur is present.     No friction rub. No gallop. No S3 or S4 sounds.  Pulmonary:     Effort: No tachypnea, accessory muscle usage or respiratory distress.     Breath sounds: No stridor. No decreased breath sounds, wheezing, rhonchi or rales.  Chest:     Chest wall: No tenderness.  Abdominal:     General: Bowel sounds are normal. There is no distension.     Palpations: Abdomen is soft.  Abdomen is not rigid.     Tenderness: There is no abdominal tenderness. There is no guarding or rebound.  Genitourinary:    Comments: Groin rash compatible with Candida Musculoskeletal:        General: Normal range of motion.     Cervical back: Normal range of motion and neck supple. No edema, erythema or rigidity. No muscular tenderness. Normal range of motion.  Lymphadenopathy:     Head:     Right side of head: No submental or submandibular adenopathy.     Left side of head: No submental or submandibular adenopathy.     Cervical: No cervical adenopathy.  Skin:    General: Skin is warm and dry.  Coloration: Skin is not pale.     Findings: Rash present.     Nails: There is no clubbing.     Comments: Bilateral groin rash compatible with Candida  Neurological:     Mental Status: She is alert and oriented to person, place, and time.     Sensory: No sensory deficit.  Psychiatric:        Speech: Speech normal.        Behavior: Behavior normal.     Assessment & Plan:   Problem List Items Addressed This Visit   None Outpatient Encounter Medications as of 09/30/2022  Medication Sig   ferrous gluconate (FERGON) 324 MG tablet Take 1 tablet (324 mg total) by mouth 2 (two) times daily with a meal.   fluconazole (DIFLUCAN) 100 MG tablet Take two once then one daily until gone   fluticasone (FLONASE) 50 MCG/ACT nasal spray Place 2 sprays into both nostrils daily.   lisinopril-hydrochlorothiazide (ZESTORETIC) 20-12.5 MG tablet Take 1 tablet by mouth daily.   naproxen (NAPROSYN) 500 MG tablet Take 1 tablet (500 mg total) by mouth 2 (two) times daily. Prn pain   No facility-administered encounter medications on file as of 09/30/2022.  Second of 3 HPV vaccines were given Follow-up: No follow-ups on file.   Asencion Noble, MD

## 2022-09-30 ENCOUNTER — Encounter: Payer: Self-pay | Admitting: Critical Care Medicine

## 2022-09-30 ENCOUNTER — Ambulatory Visit: Payer: Commercial Managed Care - PPO | Attending: Critical Care Medicine | Admitting: Critical Care Medicine

## 2022-09-30 VITALS — BP 113/79 | HR 92 | Wt 219.4 lb

## 2022-09-30 DIAGNOSIS — N898 Other specified noninflammatory disorders of vagina: Secondary | ICD-10-CM | POA: Diagnosis not present

## 2022-09-30 DIAGNOSIS — D500 Iron deficiency anemia secondary to blood loss (chronic): Secondary | ICD-10-CM

## 2022-09-30 DIAGNOSIS — M7651 Patellar tendinitis, right knee: Secondary | ICD-10-CM

## 2022-09-30 DIAGNOSIS — I10 Essential (primary) hypertension: Secondary | ICD-10-CM | POA: Diagnosis not present

## 2022-09-30 DIAGNOSIS — B3731 Acute candidiasis of vulva and vagina: Secondary | ICD-10-CM

## 2022-09-30 MED ORDER — DICLOFENAC SODIUM 1 % EX GEL: 2.0000 g | Freq: Four times a day (QID) | CUTANEOUS | 1 refills | Status: DC

## 2022-09-30 MED ORDER — LISINOPRIL-HYDROCHLOROTHIAZIDE 20-12.5 MG PO TABS: 1.0000 | ORAL_TABLET | Freq: Every day | ORAL | 3 refills | Status: DC

## 2022-09-30 NOTE — Assessment & Plan Note (Signed)
Tendinitis of the patellar tendon right knee will send to physical therapy get Voltaren gel and refer back to orthopedics

## 2022-09-30 NOTE — Assessment & Plan Note (Signed)
Patient was discussed with regarding lifestyle medicine approach will need further follow-up on this

## 2022-09-30 NOTE — Patient Instructions (Signed)
Labs today : iron and kidney panel  Use voltaren gel to knee 4 times a day, ice twice daily, continue light duty for 4 more weeks, send paper work to dr Joya Gaskins to fill out  Do knee exercises and referral to physical therapy made  Gynecology referral made  Return Dr Joya Gaskins in 6 months  Continue blood pressure medications

## 2022-09-30 NOTE — Assessment & Plan Note (Signed)
Will refer to gynecology for women's health exam

## 2022-09-30 NOTE — Assessment & Plan Note (Signed)
Hypertension well-controlled continue Zestoretic and check renal panel

## 2022-09-30 NOTE — Assessment & Plan Note (Signed)
Reassess iron levels 

## 2022-10-01 ENCOUNTER — Telehealth: Payer: Self-pay

## 2022-10-01 LAB — BMP8+EGFR
BUN/Creatinine Ratio: 23 (ref 9–23)
BUN: 16 mg/dL (ref 6–20)
CO2: 22 mmol/L (ref 20–29)
Calcium: 9.9 mg/dL (ref 8.7–10.2)
Chloride: 101 mmol/L (ref 96–106)
Creatinine, Ser: 0.71 mg/dL (ref 0.57–1.00)
Glucose: 88 mg/dL (ref 70–99)
Potassium: 4.6 mmol/L (ref 3.5–5.2)
Sodium: 138 mmol/L (ref 134–144)
eGFR: 113 mL/min/{1.73_m2} (ref >59)

## 2022-10-01 LAB — IRON,TIBC AND FERRITIN PANEL
Ferritin: 134 ng/mL (ref 15–150)
Iron Saturation: 16 % (ref 15–55)
Iron: 55 ug/dL (ref 27–159)
Total Iron Binding Capacity: 343 ug/dL (ref 250–450)
UIBC: 288 ug/dL (ref 131–425)

## 2022-10-01 NOTE — Telephone Encounter (Signed)
-----   Message from Elsie Stain, MD sent at 10/01/2022  6:10 AM EST ----- Let pt know iron levels NORMAL:  can stop iron tablets, liver kidney normal

## 2022-10-01 NOTE — Progress Notes (Signed)
Let pt know iron levels NORMAL:  can stop iron tablets, liver kidney normal

## 2022-10-01 NOTE — Telephone Encounter (Signed)
Pt was called and vm was left, Information has been sent to nurse pool.   

## 2022-10-07 ENCOUNTER — Other Ambulatory Visit: Payer: Self-pay

## 2022-10-07 ENCOUNTER — Ambulatory Visit: Payer: Commercial Managed Care - PPO | Attending: Critical Care Medicine

## 2022-10-07 DIAGNOSIS — R2689 Other abnormalities of gait and mobility: Secondary | ICD-10-CM | POA: Diagnosis not present

## 2022-10-07 DIAGNOSIS — M7651 Patellar tendinitis, right knee: Secondary | ICD-10-CM | POA: Diagnosis not present

## 2022-10-07 DIAGNOSIS — M25561 Pain in right knee: Secondary | ICD-10-CM | POA: Insufficient documentation

## 2022-10-07 DIAGNOSIS — M6281 Muscle weakness (generalized): Secondary | ICD-10-CM | POA: Diagnosis not present

## 2022-10-07 NOTE — Therapy (Signed)
OUTPATIENT PHYSICAL THERAPY LOWER EXTREMITY EVALUATION   Patient Name: [name] MRN: [id number] DOB:23-Dec-1985, 37 y.o., female Today's Date: 10/07/2022  END OF SESSION:  PT End of Session - 10/07/22 1706     Visit Number 1    Number of Visits 17    Date for PT Re-Evaluation 12/02/22    Authorization Type [place] [employer]    PT Start Time 1615    PT Stop Time 1650    PT Time Calculation (min) 35 min    Activity Tolerance Patient tolerated treatment well    Behavior During Therapy WFL for tasks assessed/performed             Past Medical History:  Diagnosis Date   Anemia    Chronic hypertension during pregnancy, antepartum 12/24/2017   [x]$  Aspirin 81 mg daily after 12 weeks; discontinue after 36 weeks Current antihypertensives:  None   Baseline and surveillance labs (pulled in from [facility], refresh links as needed)  Lab Results Component Value Date  PLT 353 11/25/2016  CREATININE 0.50 11/24/2017  AST 24 11/25/2016  ALT 17 11/25/2016  PROTCRRATIO 0.30 (H) 12/31/2015   Antenatal Testing CHTN - O10.919  Group I  BP < 140/90, no preecl   Hypertension    Pregnancy induced hypertension    Past Surgical History:  Procedure Laterality Date   TUBAL LIGATION Bilateral 07/20/2018   Procedure: POST PARTUM TUBAL LIGATION;  Surgeon: Truett Mainland, DO;  Location: [place];  Service: Gynecology;  Laterality: Bilateral;   Patient Active Problem List   Diagnosis Date Noted   Severe obesity (BMI >= 40) ([place]) 09/30/2022   Patellar tendinitis of right knee 09/26/2022   Vagina, candidiasis 05/15/2022   BMI 38.0-38.9,adult 06/24/2021   Anemia 03/20/2021   Hypertension     PCP: Elsie Stain, MD  REFERRING PROVIDER: Mcarthur Rossetti, MD  REFERRING DIAG:  [phone] (ICD-10-CM) - Unilateral primary osteoarthritis, left knee M17.11 (ICD-10-CM) - Unilateral primary osteoarthritis, right knee  THERAPY DIAG:  Acute pain of right knee  Muscle weakness  (generalized)  Other abnormalities of gait and mobility  Rationale for Evaluation and Treatment: Rehabilitation  ONSET DATE: January 2024  SUBJECTIVE:   SUBJECTIVE STATEMENT: Pt presents to PT with reports of bilateral knee pain, R>L, after fall approximately 4 weeks ago. Pt landed on her bilateral knees and has had a lot of pain since. 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 Pt notes that pain greatly increases with kneeling when she is trying to pray. She denies N/T in either LE.   PERTINENT HISTORY: HTN  PAIN:  Are you having pain?  Yes: NPRS scale: 8/10 Worst: 8/10 Pain location: bilateral knees R>L Pain description: sharp Aggravating factors: kneeling, stairs, walking Relieving factors: rest, ice  PRECAUTIONS: None  WEIGHT BEARING RESTRICTIONS: No  FALLS:  Has patient fallen in last 6 months? Yes. Number of falls - feel down   LIVING ENVIRONMENT: Lives with: lives with their family Lives in: House/apartment  OCCUPATION: Bus drive for [employer]  PLOF: Independent  PATIENT GOALS: decrease knee pain, especially when kneeling, in order to improve comfort with work and home activities   OBJECTIVE:   DIAGNOSTIC FINDINGS: See imaging  PATIENT SURVEYS:  FOTO: 29% function; 58% predicted  COGNITION: Overall cognitive status: Within functional limits for tasks assessed     SENSATION: WFL  POSTURE: rounded shoulders and forward head  PALPATION: TTP to R distal quad and patellar tendon   LOWER EXTREMITY  ROM:  Active ROM Right eval Left eval  Hip flexion    Hip extension    Hip abduction    Hip adduction    Hip internal rotation    Hip external rotation    Knee flexion 95 WNL  Knee extension 10 WNL  Ankle dorsiflexion    Ankle plantarflexion    Ankle inversion    Ankle eversion     (Blank rows = not tested)  LOWER EXTREMITY MMT:  MMT Right eval Left eval  Hip flexion    Hip extension    Hip abduction    Hip adduction    Hip  internal rotation    Hip external rotation    Knee flexion    Knee extension 2+/5 3+/5  Ankle dorsiflexion 3/5 4/5  Ankle plantarflexion    Ankle inversion    Ankle eversion     (Blank rows = not tested)  LOWER EXTREMITY SPECIAL TESTS:  Knee special tests: Patellafemoral apprehension test: positive   FUNCTIONAL TESTS:  30 Second Sit to Stand: 5 reps  GAIT: Distance walked: 30f Assistive device utilized: None Level of assistance: Complete Independence Comments: antalgic gait R LE   TREATMENT: [facility] Adult PT Treatment:                                                DATE: 10/07/2022 Therapeutic Exercise: Patellar mobs x 60" Quad set x 5 - 5" hold Supine heel slide x 5 - 5" hold Seated LAQ x 5  PATIENT EDUCATION:  Education details: eval findings, FOTO, HEP, POC Person educated: Patient Education method: Explanation, Demonstration, and Handouts Education comprehension: verbalized understanding and returned demonstration  HOME EXERCISE PROGRAM: Access Code: [email or web address]: [email or web address] Date: 10/07/2022 Prepared by: DOctavio Manns Exercises - Long Sitting 4 Way Patellar Glide  - 2 x daily - 7 x weekly - 2-3 sets - 60" hold - Supine Quadricep Sets  - 2 x daily - 7 x weekly - 2 sets - 10 reps - 5 sec hold - Supine Heel Slide  - 2 x daily - 7 x weekly - 2 sets - 10 reps - 5 sec hold - Seated Long Arc Quad  - 2 x daily - 7 x weekly - 2 sets - 10 reps  ASSESSMENT:  CLINICAL IMPRESSION: Patient is a 37y.o. F who was seen today for physical therapy evaluation and treatment for acute bilateral knee pain after fall, R>L. Physical findings are consistent with referring provider impression, as pt has significant decrease in R knee ROM, bilateral LE strength, and general functional mobility. Her FOTO score shows she is operating well below PLOF. Pt would benefit from skilled PT services working on improving strength and range post injury in order to improve  comfort and function.   OBJECTIVE IMPAIRMENTS: decreased activity tolerance, decreased mobility, difficulty walking, decreased ROM, decreased strength, and pain.   ACTIVITY LIMITATIONS: sitting, standing, squatting, stairs, transfers, and locomotion level  PARTICIPATION LIMITATIONS: driving, shopping, community activity, and yard work  PERSONAL FACTORS: Fitness and 1 comorbidity: HTN  are also affecting patient's functional outcome.   REHAB POTENTIAL: Excellent  CLINICAL DECISION MAKING: Stable/uncomplicated  EVALUATION COMPLEXITY: Low   GOALS: Goals reviewed with patient? No  SHORT TERM GOALS: Target date: 10/28/2022   Pt will be compliant and knowledgeable with initial HEP for improved comfort and carryover Baseline:  initial HEP given  Goal status: INITIAL  2.  Pt will self report bilateral knee pain no greater than 6/10 for improved comfort and functional ability Baseline: 8/10 at worst Goal status: INITIAL   LONG TERM GOALS: Target date: 12/02/2022  Pt will improve FOTO function score to no less than 58% as proxy for functional improvement Baseline: 29% function Goal status: INITIAL   2.  Pt will self report bilateral knee pain no greater than 3/10 for improved comfort and functional ability Baseline: 8/10 at worst Goal status: INITIAL   3.  Pt will increase 30 Second Sit to Stand rep count to no less than 8 reps for improved balance, strength, and functional mobility Baseline: 5 reps  Goal status: INITIAL   4.  Pt will improve R knee AROM to no less than 0-120 for improved comfort and functional mobility Baseline: 11-95 Goal status: INITIAL  5.  Pt will improve bilateral knee MMT to no less than 4/5 for all tested motions for improved comfort and function Baseline: see chart Goal status: INITIAL   PLAN:  PT FREQUENCY: 2x/week  PT DURATION: 8 weeks  PLANNED INTERVENTIONS: Therapeutic exercises, Therapeutic activity, Neuromuscular re-education, Balance  training, Gait training, Patient/Family education, Self Care, Joint mobilization, Aquatic Therapy, Dry Needling, Electrical stimulation, Cryotherapy, Moist heat, Vasopneumatic device, Manual therapy, and Re-evaluation  PLAN FOR NEXT SESSION: assess HEP response, progress proximal hip and quad strength  Check all possible CPT codes: 97164 - PT Re-evaluation, 97110- Therapeutic Exercise, [phone]- Neuro Re-education, [phone] - Gait Training, [phone] - Manual Therapy, 97530 - Therapeutic Activities, 97535 - Self Care, and [phone] - Vaso    Check all conditions that are expected to impact treatment: None of these apply   If treatment provided at initial evaluation, no treatment charged due to lack of authorization.       Ward Chatters, PT 10/07/2022, 5:11 PM

## 2022-10-13 ENCOUNTER — Other Ambulatory Visit: Payer: Self-pay | Admitting: Physician Assistant

## 2022-10-13 DIAGNOSIS — M25562 Pain in left knee: Secondary | ICD-10-CM

## 2022-10-13 DIAGNOSIS — S8001XD Contusion of right knee, subsequent encounter: Secondary | ICD-10-CM

## 2022-10-15 ENCOUNTER — Ambulatory Visit: Payer: Commercial Managed Care - PPO

## 2022-10-15 NOTE — Therapy (Incomplete)
OUTPATIENT PHYSICAL THERAPY TREATMENT NOTE   Patient Name: [name] MRN: [id number] DOB:02/06/1986, 37 y.o., female Today's Date: 10/15/2022  PCP: Elsie Stain, MD  REFERRING PROVIDER: Mcarthur Rossetti, MD   END OF SESSION:    Past Medical History:  Diagnosis Date   Anemia    Chronic hypertension during pregnancy, antepartum 12/24/2017   [x]$  Aspirin 81 mg daily after 12 weeks; discontinue after 36 weeks Current antihypertensives:  None   Baseline and surveillance labs (pulled in from [facility], refresh links as needed)  Lab Results Component Value Date  PLT 353 11/25/2016  CREATININE 0.50 11/24/2017  AST 24 11/25/2016  ALT 17 11/25/2016  PROTCRRATIO 0.30 (H) 12/31/2015   Antenatal Testing CHTN - O10.919  Group I  BP < 140/90, no preecl   Hypertension    Pregnancy induced hypertension    Past Surgical History:  Procedure Laterality Date   TUBAL LIGATION Bilateral 07/20/2018   Procedure: POST PARTUM TUBAL LIGATION;  Surgeon: Truett Mainland, DO;  Location: [place];  Service: Gynecology;  Laterality: Bilateral;   Patient Active Problem List   Diagnosis Date Noted   Severe obesity (BMI >= 40) ([place]) 09/30/2022   Patellar tendinitis of right knee 09/26/2022   Vagina, candidiasis 05/15/2022   BMI 38.0-38.9,adult 06/24/2021   Anemia 03/20/2021   Hypertension     REFERRING DIAG: M17.12 (ICD-10-CM) - Unilateral primary osteoarthritis, left knee M17.11 (ICD-10-CM) - Unilateral primary osteoarthritis, right knee  THERAPY DIAG:  No diagnosis found.  Rationale for Evaluation and Treatment Rehabilitation  PERTINENT HISTORY: HTN  PRECAUTIONS: None  SUBJECTIVE:                                                                                                                                                                                      SUBJECTIVE STATEMENT:  ***   PAIN:  Are you having pain?  Yes: NPRS scale: ***8/10 Worst: 8/10 Pain location:  bilateral knees R>L Pain description: sharp Aggravating factors: kneeling, stairs, walking Relieving factors: rest, ice     OBJECTIVE: (objective measures completed at initial evaluation unless otherwise dated)   DIAGNOSTIC FINDINGS: See imaging   PATIENT SURVEYS:  FOTO: 29% function; 58% predicted   COGNITION: Overall cognitive status: Within functional limits for tasks assessed                         SENSATION: WFL   POSTURE: rounded shoulders and forward head   PALPATION: TTP to R distal quad and patellar tendon    LOWER EXTREMITY ROM:   Active ROM Right eval Left eval  Hip flexion  Hip extension      Hip abduction      Hip adduction      Hip internal rotation      Hip external rotation      Knee flexion 95 WNL  Knee extension 10 WNL  Ankle dorsiflexion      Ankle plantarflexion      Ankle inversion      Ankle eversion       (Blank rows = not tested)   LOWER EXTREMITY MMT:   MMT Right eval Left eval  Hip flexion      Hip extension      Hip abduction      Hip adduction      Hip internal rotation      Hip external rotation      Knee flexion      Knee extension 2+/5 3+/5  Ankle dorsiflexion 3/5 4/5  Ankle plantarflexion      Ankle inversion      Ankle eversion       (Blank rows = not tested)   LOWER EXTREMITY SPECIAL TESTS:  Knee special tests: Patellafemoral apprehension test: positive    FUNCTIONAL TESTS:  30 Second Sit to Stand: 5 reps   GAIT: Distance walked: 42f Assistive device utilized: None Level of assistance: Complete Independence Comments: antalgic gait R LE     TREATMENT: [facility] Adult PT Treatment:                                                DATE: 10/15/22 Therapeutic Exercise: Nustep level 5 x 5 mins Heel raises against wall Toe raises back against wall Mini squats on wall? LAQ Seated hamstring curl RTB Supine quad set SLR with QS Heel slide with strap Hamstring curl heels on pball STS   [facility] Adult PT  Treatment:                                                DATE: 10/07/2022 Therapeutic Exercise: Patellar mobs x 60" Quad set x 5 - 5" hold Supine heel slide x 5 - 5" hold Seated LAQ x 5   PATIENT EDUCATION:  Education details: eval findings, FOTO, HEP, POC Person educated: Patient Education method: Explanation, Demonstration, and Handouts Education comprehension: verbalized understanding and returned demonstration   HOME EXERCISE PROGRAM: Access Code: [email or web address]: [email or web address] Date: 10/07/2022 Prepared by: DOctavio Manns  Exercises - Long Sitting 4 Way Patellar Glide  - 2 x daily - 7 x weekly - 2-3 sets - 60" hold - Supine Quadricep Sets  - 2 x daily - 7 x weekly - 2 sets - 10 reps - 5 sec hold - Supine Heel Slide  - 2 x daily - 7 x weekly - 2 sets - 10 reps - 5 sec hold - Seated Long Arc Quad  - 2 x daily - 7 x weekly - 2 sets - 10 reps   ASSESSMENT:   CLINICAL IMPRESSION: ***  Patient is a 37y.o. F who was seen today for physical therapy evaluation and treatment for acute bilateral knee pain after fall, R>L. Physical findings are consistent with referring provider impression, as pt has significant decrease in R knee ROM, bilateral LE strength, and general  functional mobility. Her FOTO score shows she is operating well below PLOF. Pt would benefit from skilled PT services working on improving strength and range post injury in order to improve comfort and function.    OBJECTIVE IMPAIRMENTS: decreased activity tolerance, decreased mobility, difficulty walking, decreased ROM, decreased strength, and pain.    ACTIVITY LIMITATIONS: sitting, standing, squatting, stairs, transfers, and locomotion level   PARTICIPATION LIMITATIONS: driving, shopping, community activity, and yard work   PERSONAL FACTORS: Fitness and 1 comorbidity: HTN  are also affecting patient's functional outcome.    REHAB POTENTIAL: Excellent   CLINICAL DECISION MAKING:  Stable/uncomplicated   EVALUATION COMPLEXITY: Low     GOALS: Goals reviewed with patient? No   SHORT TERM GOALS: Target date: 10/28/2022   Pt will be compliant and knowledgeable with initial HEP for improved comfort and carryover Baseline: initial HEP given  Goal status: INITIAL   2.  Pt will self report bilateral knee pain no greater than 6/10 for improved comfort and functional ability Baseline: 8/10 at worst Goal status: INITIAL    LONG TERM GOALS: Target date: 12/02/2022   Pt will improve FOTO function score to no less than 58% as proxy for functional improvement Baseline: 29% function Goal status: INITIAL    2.  Pt will self report bilateral knee pain no greater than 3/10 for improved comfort and functional ability Baseline: 8/10 at worst Goal status: INITIAL    3.  Pt will increase 30 Second Sit to Stand rep count to no less than 8 reps for improved balance, strength, and functional mobility Baseline: 5 reps  Goal status: INITIAL    4.  Pt will improve R knee AROM to no less than 0-120 for improved comfort and functional mobility Baseline: 11-95 Goal status: INITIAL   5.  Pt will improve bilateral knee MMT to no less than 4/5 for all tested motions for improved comfort and function Baseline: see chart Goal status: INITIAL     PLAN:   PT FREQUENCY: 2x/week   PT DURATION: 8 weeks   PLANNED INTERVENTIONS: Therapeutic exercises, Therapeutic activity, Neuromuscular re-education, Balance training, Gait training, Patient/Family education, Self Care, Joint mobilization, Aquatic Therapy, Dry Needling, Electrical stimulation, Cryotherapy, Moist heat, Vasopneumatic device, Manual therapy, and Re-evaluation   PLAN FOR NEXT SESSION: assess HEP response, progress proximal hip and quad strength   Margarette Canada, PTA 10/15/2022, 9:21 AM

## 2022-10-16 ENCOUNTER — Telehealth: Payer: Self-pay

## 2022-10-16 ENCOUNTER — Ambulatory Visit: Payer: Commercial Managed Care - PPO

## 2022-10-16 DIAGNOSIS — R2689 Other abnormalities of gait and mobility: Secondary | ICD-10-CM | POA: Diagnosis not present

## 2022-10-16 DIAGNOSIS — M7651 Patellar tendinitis, right knee: Secondary | ICD-10-CM | POA: Diagnosis not present

## 2022-10-16 DIAGNOSIS — M25561 Pain in right knee: Secondary | ICD-10-CM | POA: Diagnosis not present

## 2022-10-16 DIAGNOSIS — M6281 Muscle weakness (generalized): Secondary | ICD-10-CM | POA: Diagnosis not present

## 2022-10-16 NOTE — Telephone Encounter (Signed)
Spoke to patient regarding missed appointment yesterday. Confirmed today's appointment time.  1st no-show  [name], [place] 10/16/22 8:54 AM

## 2022-10-16 NOTE — Therapy (Signed)
OUTPATIENT PHYSICAL THERAPY TREATMENT NOTE   Patient Name: [name] MRN: [id number] DOB:[birth year], 37 y.o., female Today's Date: 10/16/2022  PCP: Elsie Stain, MD  REFERRING PROVIDER: Mcarthur Rossetti, MD   END OF SESSION:   PT End of Session - 10/16/22 1613     Visit Number 2    Number of Visits 17    Date for PT Re-Evaluation 12/02/22    Authorization Type [place] [employer]    PT Start Time 1615    PT Stop Time 1655    PT Time Calculation (min) 40 min    Activity Tolerance Patient tolerated treatment well;Patient limited by pain    Behavior During Therapy [facility] for tasks assessed/performed             Past Medical History:  Diagnosis Date   Anemia    Chronic hypertension during pregnancy, antepartum 12/24/2017   [x]$  Aspirin 81 mg daily after 12 weeks; discontinue after 36 weeks Current antihypertensives:  None   Baseline and surveillance labs (pulled in from [facility], refresh links as needed)  Lab Results Component Value Date  PLT 353 11/25/2016  CREATININE 0.50 11/24/2017  AST 24 11/25/2016  ALT 17 11/25/2016  PROTCRRATIO 0.30 (H) 12/31/2015   Antenatal Testing CHTN - O10.919  Group I  BP < 140/90, no preecl   Hypertension    Pregnancy induced hypertension    Past Surgical History:  Procedure Laterality Date   TUBAL LIGATION Bilateral 07/20/2018   Procedure: POST PARTUM TUBAL LIGATION;  Surgeon: Truett Mainland, DO;  Location: [place];  Service: Gynecology;  Laterality: Bilateral;   Patient Active Problem List   Diagnosis Date Noted   Severe obesity (BMI >= 40) ([place]) 09/30/2022   Patellar tendinitis of right knee 09/26/2022   Vagina, candidiasis 05/15/2022   BMI 38.0-38.9,adult 06/24/2021   Anemia 03/20/2021   Hypertension     REFERRING DIAG: M17.12 (ICD-10-CM) - Unilateral primary osteoarthritis, left knee M17.11 (ICD-10-CM) - Unilateral primary osteoarthritis, right knee  THERAPY DIAG:  Acute pain of right knee  Other abnormalities  of gait and mobility  Muscle weakness (generalized)  Rationale for Evaluation and Treatment Rehabilitation  PERTINENT HISTORY: HTN  PRECAUTIONS: None  SUBJECTIVE:                                                                                                                                                                                      SUBJECTIVE STATEMENT:  Patient reports continued pain, has been wearing knee brace at work which helps. She reports HEP compliance.    PAIN:  Are you having pain?  Yes: NPRS scale: 6/10 Worst: 8/10 Pain location: bilateral  knees R>L Pain description: sharp Aggravating factors: kneeling, stairs, walking Relieving factors: rest, ice     OBJECTIVE: (objective measures completed at initial evaluation unless otherwise dated)   DIAGNOSTIC FINDINGS: See imaging   PATIENT SURVEYS:  FOTO: 29% function; 58% predicted   COGNITION: Overall cognitive status: Within functional limits for tasks assessed                         SENSATION: WFL   POSTURE: rounded shoulders and forward head   PALPATION: TTP to R distal quad and patellar tendon    LOWER EXTREMITY ROM:   Active ROM Right eval Left eval  Hip flexion      Hip extension      Hip abduction      Hip adduction      Hip internal rotation      Hip external rotation      Knee flexion 95 WNL  Knee extension 10 WNL  Ankle dorsiflexion      Ankle plantarflexion      Ankle inversion      Ankle eversion       (Blank rows = not tested)   LOWER EXTREMITY MMT:   MMT Right eval Left eval  Hip flexion      Hip extension      Hip abduction      Hip adduction      Hip internal rotation      Hip external rotation      Knee flexion      Knee extension 2+/5 3+/5  Ankle dorsiflexion 3/5 4/5  Ankle plantarflexion      Ankle inversion      Ankle eversion       (Blank rows = not tested)   LOWER EXTREMITY SPECIAL TESTS:  Knee special tests: Patellafemoral apprehension test:  positive    FUNCTIONAL TESTS:  30 Second Sit to Stand: 5 reps   GAIT: Distance walked: 80f Assistive device utilized: None Level of assistance: Complete Independence Comments: antalgic gait R LE     TREATMENT: [facility] Adult PT Treatment:                                                DATE: 10/16/22 Therapeutic Exercise: Nustep level 3 x 5 mins Heel raises against wall 2x10 Toe raises back against wall 2x10 LAQ Lt 2x10 Seated hamstring curl RTB 2x10 Rt Supine clamshell RTB 3x10 (difficult with lack of Rt knee flexion) Sidelying hip abduction Rt 2x10 (small ROM) Supine quad set 5" hold x10 SLR with QS x10 (small ROM and pain) STS no UE Rt foot ahead x10 (pain)   [facility] Adult PT Treatment:                                                DATE: 10/07/2022 Therapeutic Exercise: Patellar mobs x 60" Quad set x 5 - 5" hold Supine heel slide x 5 - 5" hold Seated LAQ x 5   PATIENT EDUCATION:  Education details: eval findings, FOTO, HEP, POC Person educated: Patient Education method: Explanation, Demonstration, and Handouts Education comprehension: verbalized understanding and returned demonstration   HOME EXERCISE PROGRAM: Access Code: [email or web address]: [email or web address] Date: 10/07/2022 Prepared  by: Octavio Manns   Exercises - Long Sitting 4 Way Patellar Glide  - 2 x daily - 7 x weekly - 2-3 sets - 60" hold - Supine Quadricep Sets  - 2 x daily - 7 x weekly - 2 sets - 10 reps - 5 sec hold - Supine Heel Slide  - 2 x daily - 7 x weekly - 2 sets - 10 reps - 5 sec hold - Seated Long Arc Quad  - 2 x daily - 7 x weekly - 2 sets - 10 reps   ASSESSMENT:   CLINICAL IMPRESSION: Patient presents to PT reporting continued R knee pain and reports HEP compliance. Session today focused on knee ROM and strengthening. She remained in brace throughout session today due to pants not being able to go above the knee. She is somewhat limited by pain. Patient continues to benefit from  skilled PT services and should be progressed as able to improve functional independence.   OBJECTIVE IMPAIRMENTS: decreased activity tolerance, decreased mobility, difficulty walking, decreased ROM, decreased strength, and pain.    ACTIVITY LIMITATIONS: sitting, standing, squatting, stairs, transfers, and locomotion level   PARTICIPATION LIMITATIONS: driving, shopping, community activity, and yard work   PERSONAL FACTORS: Fitness and 1 comorbidity: HTN  are also affecting patient's functional outcome.    REHAB POTENTIAL: Excellent   CLINICAL DECISION MAKING: Stable/uncomplicated   EVALUATION COMPLEXITY: Low     GOALS: Goals reviewed with patient? No   SHORT TERM GOALS: Target date: 10/28/2022   Pt will be compliant and knowledgeable with initial HEP for improved comfort and carryover Baseline: initial HEP given  Goal status: INITIAL   2.  Pt will self report bilateral knee pain no greater than 6/10 for improved comfort and functional ability Baseline: 8/10 at worst Goal status: INITIAL    LONG TERM GOALS: Target date: 12/02/2022   Pt will improve FOTO function score to no less than 58% as proxy for functional improvement Baseline: 29% function Goal status: INITIAL    2.  Pt will self report bilateral knee pain no greater than 3/10 for improved comfort and functional ability Baseline: 8/10 at worst Goal status: INITIAL    3.  Pt will increase 30 Second Sit to Stand rep count to no less than 8 reps for improved balance, strength, and functional mobility Baseline: 5 reps  Goal status: INITIAL    4.  Pt will improve R knee AROM to no less than 0-120 for improved comfort and functional mobility Baseline: 11-95 Goal status: INITIAL   5.  Pt will improve bilateral knee MMT to no less than 4/5 for all tested motions for improved comfort and function Baseline: see chart Goal status: INITIAL     PLAN:   PT FREQUENCY: 2x/week   PT DURATION: 8 weeks   PLANNED  INTERVENTIONS: Therapeutic exercises, Therapeutic activity, Neuromuscular re-education, Balance training, Gait training, Patient/Family education, Self Care, Joint mobilization, Aquatic Therapy, Dry Needling, Electrical stimulation, Cryotherapy, Moist heat, Vasopneumatic device, Manual therapy, and Re-evaluation   PLAN FOR NEXT SESSION: assess HEP response, progress proximal hip and quad strength   Margarette Canada, PTA 10/16/2022, 4:56 PM

## 2022-10-22 NOTE — Therapy (Incomplete)
OUTPATIENT PHYSICAL THERAPY TREATMENT NOTE   Patient Name: [name] MRN: [id number] DOB:[birth year], 37 y.o., female Today's Date: 10/22/2022  PCP: Elsie Stain, MD  REFERRING PROVIDER: Mcarthur Rossetti, MD   END OF SESSION:     Past Medical History:  Diagnosis Date   Anemia    Chronic hypertension during pregnancy, antepartum 12/24/2017   '[x]'$  Aspirin 81 mg daily after 12 weeks; discontinue after 36 weeks Current antihypertensives:  None   Baseline and surveillance labs (pulled in from [facility], refresh links as needed)  Lab Results Component Value Date  PLT 353 11/25/2016  CREATININE 0.50 11/24/2017  AST 24 11/25/2016  ALT 17 11/25/2016  PROTCRRATIO 0.30 (H) 12/31/2015   Antenatal Testing CHTN - O10.919  Group I  BP < 140/90, no preecl   Hypertension    Pregnancy induced hypertension    Past Surgical History:  Procedure Laterality Date   TUBAL LIGATION Bilateral 07/20/2018   Procedure: POST PARTUM TUBAL LIGATION;  Surgeon: Truett Mainland, DO;  Location: [place];  Service: Gynecology;  Laterality: Bilateral;   Patient Active Problem List   Diagnosis Date Noted   Severe obesity (BMI >= 40) ([place]) 09/30/2022   Patellar tendinitis of right knee 09/26/2022   Vagina, candidiasis 05/15/2022   BMI 38.0-38.9,adult 06/24/2021   Anemia 03/20/2021   Hypertension     REFERRING DIAG: M17.12 (ICD-10-CM) - Unilateral primary osteoarthritis, left knee M17.11 (ICD-10-CM) - Unilateral primary osteoarthritis, right knee  THERAPY DIAG:  No diagnosis found.  Rationale for Evaluation and Treatment Rehabilitation  PERTINENT HISTORY: HTN  PRECAUTIONS: None  SUBJECTIVE:                                                                                                                                                                                      SUBJECTIVE STATEMENT:  *** Patient reports continued pain, has been wearing knee brace at work which helps. She reports  HEP compliance.    PAIN:  Are you having pain?  Yes: NPRS scale: *** 6/10 Worst: 8/10 Pain location: bilateral knees R>L Pain description: sharp Aggravating factors: kneeling, stairs, walking Relieving factors: rest, ice     OBJECTIVE: (objective measures completed at initial evaluation unless otherwise dated)   DIAGNOSTIC FINDINGS: See imaging   PATIENT SURVEYS:  FOTO: 29% function; 58% predicted   COGNITION: Overall cognitive status: Within functional limits for tasks assessed                         SENSATION: WFL   POSTURE: rounded shoulders and forward head   PALPATION: TTP to R distal quad and patellar tendon  LOWER EXTREMITY ROM:   Active ROM Right eval Left eval  Hip flexion      Hip extension      Hip abduction      Hip adduction      Hip internal rotation      Hip external rotation      Knee flexion 95 WNL  Knee extension 10 WNL  Ankle dorsiflexion      Ankle plantarflexion      Ankle inversion      Ankle eversion       (Blank rows = not tested)   LOWER EXTREMITY MMT:   MMT Right eval Left eval  Hip flexion      Hip extension      Hip abduction      Hip adduction      Hip internal rotation      Hip external rotation      Knee flexion      Knee extension 2+/5 3+/5  Ankle dorsiflexion 3/5 4/5  Ankle plantarflexion      Ankle inversion      Ankle eversion       (Blank rows = not tested)   LOWER EXTREMITY SPECIAL TESTS:  Knee special tests: Patellafemoral apprehension test: positive    FUNCTIONAL TESTS:  30 Second Sit to Stand: 5 reps   GAIT: Distance walked: 52f Assistive device utilized: None Level of assistance: Complete Independence Comments: antalgic gait R LE     TREATMENT: [facility] Adult PT Treatment:                                                DATE: 10/22/22 Therapeutic Exercise: Nustep level 3 x 5 mins Heel raises against wall 2x10 Toe raises back against wall 2x10 LAQ Lt 2x10 Seated hamstring curl RTB 2x10  Rt Supine clamshell RTB 3x10 (difficult with lack of Rt knee flexion) Sidelying hip abduction Rt 2x10 (small ROM) Supine quad set 5" hold x10 SLR with QS x10 (small ROM and pain) STS no UE Rt foot ahead x10 (pain)   [facility] Adult PT Treatment:                                                DATE: 10/16/22 Therapeutic Exercise: Nustep level 3 x 5 mins Heel raises against wall 2x10 Toe raises back against wall 2x10 LAQ Lt 2x10 Seated hamstring curl RTB 2x10 Rt Supine clamshell RTB 3x10 (difficult with lack of Rt knee flexion) Sidelying hip abduction Rt 2x10 (small ROM) Supine quad set 5" hold x10 SLR with QS x10 (small ROM and pain) STS no UE Rt foot ahead x10 (pain)   [facility] Adult PT Treatment:                                                DATE: 10/07/2022 Therapeutic Exercise: Patellar mobs x 60" Quad set x 5 - 5" hold Supine heel slide x 5 - 5" hold Seated LAQ x 5   PATIENT EDUCATION:  Education details: eval findings, FOTO, HEP, POC Person educated: Patient Education method: Explanation, Demonstration, and Handouts Education  comprehension: verbalized understanding and returned demonstration   HOME EXERCISE PROGRAM: Access Code: [email or web address] URL: [email or web address] Date: 10/07/2022 Prepared by: Octavio Manns   Exercises - Long Sitting 4 Way Patellar Glide  - 2 x daily - 7 x weekly - 2-3 sets - 60" hold - Supine Quadricep Sets  - 2 x daily - 7 x weekly - 2 sets - 10 reps - 5 sec hold - Supine Heel Slide  - 2 x daily - 7 x weekly - 2 sets - 10 reps - 5 sec hold - Seated Long Arc Quad  - 2 x daily - 7 x weekly - 2 sets - 10 reps   ASSESSMENT:   CLINICAL IMPRESSION: ***  Patient presents to PT reporting continued R knee pain and reports HEP compliance. Session today focused on knee ROM and strengthening. She remained in brace throughout session today due to pants not being able to go above the knee. She is somewhat limited by pain. Patient continues to benefit  from skilled PT services and should be progressed as able to improve functional independence.   OBJECTIVE IMPAIRMENTS: decreased activity tolerance, decreased mobility, difficulty walking, decreased ROM, decreased strength, and pain.    ACTIVITY LIMITATIONS: sitting, standing, squatting, stairs, transfers, and locomotion level   PARTICIPATION LIMITATIONS: driving, shopping, community activity, and yard work   PERSONAL FACTORS: Fitness and 1 comorbidity: HTN  are also affecting patient's functional outcome.    REHAB POTENTIAL: Excellent   CLINICAL DECISION MAKING: Stable/uncomplicated   EVALUATION COMPLEXITY: Low     GOALS: Goals reviewed with patient? No   SHORT TERM GOALS: Target date: 10/28/2022   Pt will be compliant and knowledgeable with initial HEP for improved comfort and carryover Baseline: initial HEP given  Goal status: INITIAL   2.  Pt will self report bilateral knee pain no greater than 6/10 for improved comfort and functional ability Baseline: 8/10 at worst Goal status: INITIAL    LONG TERM GOALS: Target date: 12/02/2022   Pt will improve FOTO function score to no less than 58% as proxy for functional improvement Baseline: 29% function Goal status: INITIAL    2.  Pt will self report bilateral knee pain no greater than 3/10 for improved comfort and functional ability Baseline: 8/10 at worst Goal status: INITIAL    3.  Pt will increase 30 Second Sit to Stand rep count to no less than 8 reps for improved balance, strength, and functional mobility Baseline: 5 reps  Goal status: INITIAL    4.  Pt will improve R knee AROM to no less than 0-120 for improved comfort and functional mobility Baseline: 11-95 Goal status: INITIAL   5.  Pt will improve bilateral knee MMT to no less than 4/5 for all tested motions for improved comfort and function Baseline: see chart Goal status: INITIAL     PLAN:   PT FREQUENCY: 2x/week   PT DURATION: 8 weeks   PLANNED  INTERVENTIONS: Therapeutic exercises, Therapeutic activity, Neuromuscular re-education, Balance training, Gait training, Patient/Family education, Self Care, Joint mobilization, Aquatic Therapy, Dry Needling, Electrical stimulation, Cryotherapy, Moist heat, Vasopneumatic device, Manual therapy, and Re-evaluation   PLAN FOR NEXT SESSION: assess HEP response, progress proximal hip and quad strength   Margarette Canada, PTA 10/22/2022, 11:55 AM

## 2022-10-23 ENCOUNTER — Telehealth: Payer: Self-pay

## 2022-10-23 ENCOUNTER — Ambulatory Visit: Payer: Commercial Managed Care - PPO

## 2022-10-23 DIAGNOSIS — R2689 Other abnormalities of gait and mobility: Secondary | ICD-10-CM

## 2022-10-23 DIAGNOSIS — M25561 Pain in right knee: Secondary | ICD-10-CM

## 2022-10-23 DIAGNOSIS — M7651 Patellar tendinitis, right knee: Secondary | ICD-10-CM | POA: Diagnosis not present

## 2022-10-23 DIAGNOSIS — M6281 Muscle weakness (generalized): Secondary | ICD-10-CM | POA: Diagnosis not present

## 2022-10-23 NOTE — Therapy (Signed)
OUTPATIENT PHYSICAL THERAPY TREATMENT NOTE   Patient Name: [name] MRN: [id number] DOB:[birth year], 37 y.o., female Today's Date: 10/23/2022  PCP: Elsie Stain, MD  REFERRING PROVIDER: Mcarthur Rossetti, MD   END OF SESSION:   PT End of Session - 10/23/22 1748     Visit Number 3    Number of Visits 17    Date for PT Re-Evaluation 12/02/22    Authorization Type [place] [employer]    PT Start Time 1748    PT Stop Time 1828    PT Time Calculation (min) 40 min    Activity Tolerance Patient tolerated treatment well;Patient limited by pain    Behavior During Therapy [facility] for tasks assessed/performed              Past Medical History:  Diagnosis Date   Anemia    Chronic hypertension during pregnancy, antepartum 12/24/2017   '[x]'$  Aspirin 81 mg daily after 12 weeks; discontinue after 36 weeks Current antihypertensives:  None   Baseline and surveillance labs (pulled in from [facility], refresh links as needed)  Lab Results Component Value Date  PLT 353 11/25/2016  CREATININE 0.50 11/24/2017  AST 24 11/25/2016  ALT 17 11/25/2016  PROTCRRATIO 0.30 (H) 12/31/2015   Antenatal Testing CHTN - O10.919  Group I  BP < 140/90, no preecl   Hypertension    Pregnancy induced hypertension    Past Surgical History:  Procedure Laterality Date   TUBAL LIGATION Bilateral 07/20/2018   Procedure: POST PARTUM TUBAL LIGATION;  Surgeon: Truett Mainland, DO;  Location: [place];  Service: Gynecology;  Laterality: Bilateral;   Patient Active Problem List   Diagnosis Date Noted   Severe obesity (BMI >= 40) ([place]) 09/30/2022   Patellar tendinitis of right knee 09/26/2022   Vagina, candidiasis 05/15/2022   BMI 38.0-38.9,adult 06/24/2021   Anemia 03/20/2021   Hypertension     REFERRING DIAG: M17.12 (ICD-10-CM) - Unilateral primary osteoarthritis, left knee M17.11 (ICD-10-CM) - Unilateral primary osteoarthritis, right knee  THERAPY DIAG:  Acute pain of right knee  Other  abnormalities of gait and mobility  Muscle weakness (generalized)  Rationale for Evaluation and Treatment Rehabilitation  PERTINENT HISTORY: HTN  PRECAUTIONS: None  SUBJECTIVE:                                                                                                                                                                                      SUBJECTIVE STATEMENT:  Patient reports continued Rt knee pain, HEP compliance.    PAIN:  Are you having pain?  Yes: NPRS scale: 4/10 Worst: 8/10 Pain location: bilateral knees R>L Pain description: sharp Aggravating factors: kneeling,  stairs, walking Relieving factors: rest, ice     OBJECTIVE: (objective measures completed at initial evaluation unless otherwise dated)   DIAGNOSTIC FINDINGS: See imaging   PATIENT SURVEYS:  FOTO: 29% function; 58% predicted   COGNITION: Overall cognitive status: Within functional limits for tasks assessed                         SENSATION: WFL   POSTURE: rounded shoulders and forward head   PALPATION: TTP to R distal quad and patellar tendon    LOWER EXTREMITY ROM:   Active ROM Right eval Left eval Right 10/23/22  Hip flexion       Hip extension       Hip abduction       Hip adduction       Hip internal rotation       Hip external rotation       Knee flexion 95 WNL 102  Knee extension 10 WNL   Ankle dorsiflexion       Ankle plantarflexion       Ankle inversion       Ankle eversion        (Blank rows = not tested)   LOWER EXTREMITY MMT:   MMT Right eval Left eval  Hip flexion      Hip extension      Hip abduction      Hip adduction      Hip internal rotation      Hip external rotation      Knee flexion      Knee extension 2+/5 3+/5  Ankle dorsiflexion 3/5 4/5  Ankle plantarflexion      Ankle inversion      Ankle eversion       (Blank rows = not tested)   LOWER EXTREMITY SPECIAL TESTS:  Knee special tests: Patellafemoral apprehension test: positive     FUNCTIONAL TESTS:  30 Second Sit to Stand: 5 reps   GAIT: Distance walked: 68f Assistive device utilized: None Level of assistance: Complete Independence Comments: antalgic gait R LE     TREATMENT: [facility] Adult PT Treatment:                                                DATE: 10/23/22 Therapeutic Exercise: Nustep level 5 x 5 mins Heel raises against wall 2x10 Toe raises back against wall 2x10 LAQ Lt 2x10, Rt x10 Seated hamstring curl RTB 2x10 Rt Seated clamshell GTB 3x10 Sidelying hip abduction Rt x10 (small ROM) Supine quad set 5" hold 2x10 Rt Supine hip adduction ball squeeze 5" hold 2x10 SLR with QS x10 Rt (small ROM and pain)   [facility] Adult PT Treatment:                                                DATE: 10/16/22 Therapeutic Exercise: Nustep level 3 x 5 mins Heel raises against wall 2x10 Toe raises back against wall 2x10 LAQ Lt 2x10 Seated hamstring curl RTB 2x10 Rt Supine clamshell RTB 3x10 (difficult with lack of Rt knee flexion) Sidelying hip abduction Rt 2x10 (small ROM) Supine quad set 5" hold x10 SLR with QS x10 (small ROM and pain) STS no UE  Rt foot ahead x10 (pain)   [facility] Adult PT Treatment:                                                DATE: 10/07/2022 Therapeutic Exercise: Patellar mobs x 60" Quad set x 5 - 5" hold Supine heel slide x 5 - 5" hold Seated LAQ x 5   PATIENT EDUCATION:  Education details: eval findings, FOTO, HEP, POC Person educated: Patient Education method: Explanation, Demonstration, and Handouts Education comprehension: verbalized understanding and returned demonstration   HOME EXERCISE PROGRAM: Access Code: [email or web address] URL: [email or web address] Date: 10/07/2022 Prepared by: Octavio Manns   Exercises - Long Sitting 4 Way Patellar Glide  - 2 x daily - 7 x weekly - 2-3 sets - 60" hold - Supine Quadricep Sets  - 2 x daily - 7 x weekly - 2 sets - 10 reps - 5 sec hold - Supine Heel Slide  - 2 x daily - 7 x weekly -  2 sets - 10 reps - 5 sec hold - Seated Long Arc Quad  - 2 x daily - 7 x weekly - 2 sets - 10 reps   ASSESSMENT:   CLINICAL IMPRESSION: Patient presents to PT with continued R knee pain, though lessened today. Session today focused on knee ROM and strengthening with improvements noted since last session. Her ROM has improved and she was able to complete more repetitions of exercises today without an increase in pain. Patient was able to tolerate all prescribed exercises with no adverse effects. Patient continues to benefit from skilled PT services and should be progressed as able to improve functional independence.   OBJECTIVE IMPAIRMENTS: decreased activity tolerance, decreased mobility, difficulty walking, decreased ROM, decreased strength, and pain.    ACTIVITY LIMITATIONS: sitting, standing, squatting, stairs, transfers, and locomotion level   PARTICIPATION LIMITATIONS: driving, shopping, community activity, and yard work   PERSONAL FACTORS: Fitness and 1 comorbidity: HTN  are also affecting patient's functional outcome.    REHAB POTENTIAL: Excellent   CLINICAL DECISION MAKING: Stable/uncomplicated   EVALUATION COMPLEXITY: Low     GOALS: Goals reviewed with patient? No   SHORT TERM GOALS: Target date: 10/28/2022   Pt will be compliant and knowledgeable with initial HEP for improved comfort and carryover Baseline: initial HEP given  Goal status: INITIAL   2.  Pt will self report bilateral knee pain no greater than 6/10 for improved comfort and functional ability Baseline: 8/10 at worst Goal status: INITIAL    LONG TERM GOALS: Target date: 12/02/2022   Pt will improve FOTO function score to no less than 58% as proxy for functional improvement Baseline: 29% function Goal status: INITIAL    2.  Pt will self report bilateral knee pain no greater than 3/10 for improved comfort and functional ability Baseline: 8/10 at worst Goal status: INITIAL    3.  Pt will increase 30 Second  Sit to Stand rep count to no less than 8 reps for improved balance, strength, and functional mobility Baseline: 5 reps  Goal status: INITIAL    4.  Pt will improve R knee AROM to no less than 0-120 for improved comfort and functional mobility Baseline: 11-95 Goal status: INITIAL   5.  Pt will improve bilateral knee MMT to no less than 4/5 for all tested motions for improved comfort and function Baseline: see chart  Goal status: INITIAL     PLAN:   PT FREQUENCY: 2x/week   PT DURATION: 8 weeks   PLANNED INTERVENTIONS: Therapeutic exercises, Therapeutic activity, Neuromuscular re-education, Balance training, Gait training, Patient/Family education, Self Care, Joint mobilization, Aquatic Therapy, Dry Needling, Electrical stimulation, Cryotherapy, Moist heat, Vasopneumatic device, Manual therapy, and Re-evaluation   PLAN FOR NEXT SESSION: assess HEP response, progress proximal hip and quad strength   Margarette Canada, PTA 10/23/2022, 6:27 PM

## 2022-10-23 NOTE — Telephone Encounter (Signed)
Created in error

## 2022-10-27 ENCOUNTER — Ambulatory Visit: Payer: Commercial Managed Care - PPO | Attending: Critical Care Medicine

## 2022-10-27 DIAGNOSIS — M6281 Muscle weakness (generalized): Secondary | ICD-10-CM | POA: Insufficient documentation

## 2022-10-27 DIAGNOSIS — M25561 Pain in right knee: Secondary | ICD-10-CM | POA: Diagnosis not present

## 2022-10-27 DIAGNOSIS — R2689 Other abnormalities of gait and mobility: Secondary | ICD-10-CM | POA: Diagnosis not present

## 2022-10-27 NOTE — Therapy (Signed)
OUTPATIENT PHYSICAL THERAPY TREATMENT NOTE   Patient Name: [name] MRN: [id number] DOB:1986-05-29, 37 y.o., female Today's Date: 10/27/2022  PCP: Elsie Stain, MD  REFERRING PROVIDER: Mcarthur Rossetti, MD   END OF SESSION:   PT End of Session - 10/27/22 1528     Visit Number 4    Number of Visits 17    Date for PT Re-Evaluation 12/02/22    Authorization Type [place] [employer]    PT Start Time 1530    PT Stop Time 1610    PT Time Calculation (min) 40 min    Activity Tolerance Patient tolerated treatment well;Patient limited by pain    Behavior During Therapy [facility] for tasks assessed/performed               Past Medical History:  Diagnosis Date   Anemia    Chronic hypertension during pregnancy, antepartum 12/24/2017   '[x]'$  Aspirin 81 mg daily after 12 weeks; discontinue after 36 weeks Current antihypertensives:  None   Baseline and surveillance labs (pulled in from [facility], refresh links as needed)  Lab Results Component Value Date  PLT 353 11/25/2016  CREATININE 0.50 11/24/2017  AST 24 11/25/2016  ALT 17 11/25/2016  PROTCRRATIO 0.30 (H) 12/31/2015   Antenatal Testing CHTN - O10.919  Group I  BP < 140/90, no preecl   Hypertension    Pregnancy induced hypertension    Past Surgical History:  Procedure Laterality Date   TUBAL LIGATION Bilateral 07/20/2018   Procedure: POST PARTUM TUBAL LIGATION;  Surgeon: Truett Mainland, DO;  Location: [place];  Service: Gynecology;  Laterality: Bilateral;   Patient Active Problem List   Diagnosis Date Noted   Severe obesity (BMI >= 40) ([place]) 09/30/2022   Patellar tendinitis of right knee 09/26/2022   Vagina, candidiasis 05/15/2022   BMI 38.0-38.9,adult 06/24/2021   Anemia 03/20/2021   Hypertension     REFERRING DIAG: M17.12 (ICD-10-CM) - Unilateral primary osteoarthritis, left knee M17.11 (ICD-10-CM) - Unilateral primary osteoarthritis, right knee  THERAPY DIAG:  Acute pain of right knee  Other  abnormalities of gait and mobility  Muscle weakness (generalized)  Rationale for Evaluation and Treatment Rehabilitation  PERTINENT HISTORY: HTN  PRECAUTIONS: None  SUBJECTIVE:                                                                                                                                                                                      SUBJECTIVE STATEMENT: Pt presents to PT with reports of decreased knee pain. Has been compliant with HEP with no adverse effect.    PAIN:  Are you having pain?  Yes: NPRS scale: 0/10 Worst: 8/10  Pain location: bilateral knees R>L Pain description: sharp Aggravating factors: kneeling, stairs, walking Relieving factors: rest, ice     OBJECTIVE: (objective measures completed at initial evaluation unless otherwise dated)   DIAGNOSTIC FINDINGS: See imaging   PATIENT SURVEYS:  FOTO: 29% function; 58% predicted   COGNITION: Overall cognitive status: Within functional limits for tasks assessed                         SENSATION: WFL   POSTURE: rounded shoulders and forward head   PALPATION: TTP to R distal quad and patellar tendon    LOWER EXTREMITY ROM:   Active ROM Right eval Left eval Right 10/23/22 Right 10/27/22  Hip flexion        Hip extension        Hip abduction        Hip adduction        Hip internal rotation        Hip external rotation        Knee flexion 95 WNL 102   Knee extension 10 WNL    Ankle dorsiflexion        Ankle plantarflexion        Ankle inversion        Ankle eversion         (Blank rows = not tested)   LOWER EXTREMITY MMT:   MMT Right eval Left eval  Hip flexion      Hip extension      Hip abduction      Hip adduction      Hip internal rotation      Hip external rotation      Knee flexion      Knee extension 2+/5 3+/5  Ankle dorsiflexion 3/5 4/5  Ankle plantarflexion      Ankle inversion      Ankle eversion       (Blank rows = not tested)   LOWER EXTREMITY  SPECIAL TESTS:  Knee special tests: Patellafemoral apprehension test: positive    FUNCTIONAL TESTS:  30 Second Sit to Stand: 5 reps   GAIT: Distance walked: 40f Assistive device utilized: None Level of assistance: Complete Independence Comments: antalgic gait R LE     TREATMENT: [facility] Adult PT Treatment:                                                DATE: 10/27/22 Therapeutic Exercise: Nustep level 5 x 5 min while taking subjective LAQ 2x10 2# Supine clamshell 3x10 black band Supine quad set x 10 - 5" hold Seated clamshell x 10 RTB Knee flex/ext stretch on step x 10 each - 6in Step up x 10 6in fwd  TKE with ball x 10 each - 5" hold Standing mini squat with UE 2x10  [facility] Adult PT Treatment:                                                DATE: 10/23/22 Therapeutic Exercise: Nustep level 5 x 5 mins Heel raises against wall 2x10 Toe raises back against wall 2x10 LAQ Lt 2x10, Rt x10 Seated hamstring curl RTB 2x10 Rt Seated clamshell GTB 3x10 Sidelying hip abduction Rt x10 (small  ROM) Supine quad set 5" hold 2x10 Rt Supine hip adduction ball squeeze 5" hold 2x10 SLR with QS x10 Rt (small ROM and pain)   [facility] Adult PT Treatment:                                                DATE: 10/16/22 Therapeutic Exercise: Nustep level 3 x 5 mins Heel raises against wall 2x10 Toe raises back against wall 2x10 LAQ Lt 2x10 Seated hamstring curl RTB 2x10 Rt Supine clamshell RTB 3x10 (difficult with lack of Rt knee flexion) Sidelying hip abduction Rt 2x10 (small ROM) Supine quad set 5" hold x10 SLR with QS x10 (small ROM and pain) STS no UE Rt foot ahead x10 (pain)   [facility] Adult PT Treatment:                                                DATE: 10/07/2022 Therapeutic Exercise: Patellar mobs x 60" Quad set x 5 - 5" hold Supine heel slide x 5 - 5" hold Seated LAQ x 5   PATIENT EDUCATION:  Education details: eval findings, FOTO, HEP, POC Person educated: Patient Education method:  Explanation, Demonstration, and Handouts Education comprehension: verbalized understanding and returned demonstration   HOME EXERCISE PROGRAM: Access Code: [email or web address] URL: [email or web address] Date: 10/07/2022 Prepared by: Octavio Manns   Exercises - Long Sitting 4 Way Patellar Glide  - 2 x daily - 7 x weekly - 2-3 sets - 60" hold - Supine Quadricep Sets  - 2 x daily - 7 x weekly - 2 sets - 10 reps - 5 sec hold - Supine Heel Slide  - 2 x daily - 7 x weekly - 2 sets - 10 reps - 5 sec hold - Seated Long Arc Quad  - 2 x daily - 7 x weekly - 2 sets - 10 reps   ASSESSMENT:   CLINICAL IMPRESSION: Pt was able to complete all prescribed exercises with no adverse effect or increase in pain. Therapy focused on improving quad and proximal hip strength as well as R knee ROM. Continues to lack R knee extension and flexion. Continues to benefit from skilled PT, will continue per POC as prescribed.   OBJECTIVE IMPAIRMENTS: decreased activity tolerance, decreased mobility, difficulty walking, decreased ROM, decreased strength, and pain.    ACTIVITY LIMITATIONS: sitting, standing, squatting, stairs, transfers, and locomotion level   PARTICIPATION LIMITATIONS: driving, shopping, community activity, and yard work   PERSONAL FACTORS: Fitness and 1 comorbidity: HTN  are also affecting patient's functional outcome.      GOALS: Goals reviewed with patient? No   SHORT TERM GOALS: Target date: 10/28/2022   Pt will be compliant and knowledgeable with initial HEP for improved comfort and carryover Baseline: initial HEP given  Goal status: INITIAL   2.  Pt will self report bilateral knee pain no greater than 6/10 for improved comfort and functional ability Baseline: 8/10 at worst Goal status: INITIAL    LONG TERM GOALS: Target date: 12/02/2022   Pt will improve FOTO function score to no less than 58% as proxy for functional improvement Baseline: 29% function Goal status: INITIAL    2.  Pt  will self report bilateral knee pain no greater than 3/10  for improved comfort and functional ability Baseline: 8/10 at worst Goal status: INITIAL    3.  Pt will increase 30 Second Sit to Stand rep count to no less than 8 reps for improved balance, strength, and functional mobility Baseline: 5 reps  Goal status: INITIAL    4.  Pt will improve R knee AROM to no less than 0-120 for improved comfort and functional mobility Baseline: 11-95 Goal status: INITIAL   5.  Pt will improve bilateral knee MMT to no less than 4/5 for all tested motions for improved comfort and function Baseline: see chart Goal status: INITIAL     PLAN:   PT FREQUENCY: 2x/week   PT DURATION: 8 weeks   PLANNED INTERVENTIONS: Therapeutic exercises, Therapeutic activity, Neuromuscular re-education, Balance training, Gait training, Patient/Family education, Self Care, Joint mobilization, Aquatic Therapy, Dry Needling, Electrical stimulation, Cryotherapy, Moist heat, Vasopneumatic device, Manual therapy, and Re-evaluation   PLAN FOR NEXT SESSION: assess HEP response, progress proximal hip and quad strength   Ward Chatters, PT 10/27/2022, 4:12 PM

## 2022-10-28 NOTE — Therapy (Unsigned)
OUTPATIENT PHYSICAL THERAPY TREATMENT NOTE   Patient Name: [name] MRN: [id number] DOB:26-Dec-1985, 37 y.o., female Today's Date: 10/29/2022  PCP: Elsie Stain, MD  REFERRING PROVIDER: Mcarthur Rossetti, MD   END OF SESSION:   PT End of Session - 10/29/22 1832     Visit Number 5    Number of Visits 17    Date for PT Re-Evaluation 12/02/22    Authorization Type [place] [employer]    PT Start Time 1831    PT Stop Time 1910    PT Time Calculation (min) 39 min    Activity Tolerance Patient tolerated treatment well;Patient limited by pain    Behavior During Therapy [facility] for tasks assessed/performed                Past Medical History:  Diagnosis Date   Anemia    Chronic hypertension during pregnancy, antepartum 12/24/2017   '[x]'$  Aspirin 81 mg daily after 12 weeks; discontinue after 36 weeks Current antihypertensives:  None   Baseline and surveillance labs (pulled in from [facility], refresh links as needed)  Lab Results Component Value Date  PLT 353 11/25/2016  CREATININE 0.50 11/24/2017  AST 24 11/25/2016  ALT 17 11/25/2016  PROTCRRATIO 0.30 (H) 12/31/2015   Antenatal Testing CHTN - O10.919  Group I  BP < 140/90, no preecl   Hypertension    Pregnancy induced hypertension    Past Surgical History:  Procedure Laterality Date   TUBAL LIGATION Bilateral 07/20/2018   Procedure: POST PARTUM TUBAL LIGATION;  Surgeon: Truett Mainland, DO;  Location: [place];  Service: Gynecology;  Laterality: Bilateral;   Patient Active Problem List   Diagnosis Date Noted   Severe obesity (BMI >= 40) ([place]) 09/30/2022   Patellar tendinitis of right knee 09/26/2022   Vagina, candidiasis 05/15/2022   BMI 38.0-38.9,adult 06/24/2021   Anemia 03/20/2021   Hypertension     REFERRING DIAG: M17.12 (ICD-10-CM) - Unilateral primary osteoarthritis, left knee M17.11 (ICD-10-CM) - Unilateral primary osteoarthritis, right knee  THERAPY DIAG:  Acute pain of right knee  Other  abnormalities of gait and mobility  Muscle weakness (generalized)  Rationale for Evaluation and Treatment Rehabilitation  PERTINENT HISTORY: HTN  PRECAUTIONS: None  SUBJECTIVE:                                                                                                                                                                                      SUBJECTIVE STATEMENT: Reports no resting pain now, aggravated by prolonged positions and stair negotiation    PAIN:  Are you having pain?  Yes: NPRS scale: 0/10 Worst: 8/10 Pain location: bilateral knees R>L Pain  description: sharp Aggravating factors: kneeling, stairs, walking Relieving factors: rest, ice     OBJECTIVE: (objective measures completed at initial evaluation unless otherwise dated)   DIAGNOSTIC FINDINGS: See imaging   PATIENT SURVEYS:  FOTO: 29% function; 58% predicted   COGNITION: Overall cognitive status: Within functional limits for tasks assessed                         SENSATION: WFL   POSTURE: rounded shoulders and forward head   PALPATION: TTP to R distal quad and patellar tendon    LOWER EXTREMITY ROM:   Active ROM Right eval Left eval Right 10/23/22 Right 10/29/22  Hip flexion        Hip extension        Hip abduction        Hip adduction        Hip internal rotation        Hip external rotation        Knee flexion 95 WNL 102 115  Knee extension 10 WNL    Ankle dorsiflexion        Ankle plantarflexion        Ankle inversion        Ankle eversion         (Blank rows = not tested)   LOWER EXTREMITY MMT:   MMT Right eval Left eval  Hip flexion      Hip extension      Hip abduction      Hip adduction      Hip internal rotation      Hip external rotation      Knee flexion      Knee extension 2+/5 3+/5  Ankle dorsiflexion 3/5 4/5  Ankle plantarflexion      Ankle inversion      Ankle eversion       (Blank rows = not tested)   LOWER EXTREMITY SPECIAL TESTS:  Knee  special tests: Patellafemoral apprehension test: positive    FUNCTIONAL TESTS:  30 Second Sit to Stand: 5 reps   GAIT: Distance walked: 58f Assistive device utilized: None Level of assistance: Complete Independence Comments: antalgic gait R LE     TREATMENT: [facility] Adult PT Treatment:                                                DATE: 10/29/22 Therapeutic Exercise: Nustep level 4 x 8 min R SLR 10x Heel slides over slide board with strap 10x(108d) SAQs 10x LAQs eccentric R 5# 15x Supine hip fallouts GTB unilateral 15/15 Supine march 15/15 (to promote knee flexion) PF against wall 15x McConnell patellar tendon taping Taping applied to R knee using patellar tendon technique.  Patient instructed to remove tape with any increased pain, skin irritation or in the event the tape loosens and can not be re-applied. Cautioned to never apply Leukotape directly over skin without protective cover roll in place.    [facility]                                                [facility]: [facility] Therapeutic Exercise: Nustep level 5 x 5 min while taking subjective LAQ 2x10 2#  Supine clamshell 3x10 black band Supine quad set x 10 - 5" hold Seated clamshell x 10 RTB Knee flex/ext stretch on step x 10 each - 6in Step up x 10 6in fwd  TKE with ball x 10 each - 5" hold Standing mini squat with UE 2x10  [facility] Adult PT Treatment:                                                DATE: 10/23/22 Therapeutic Exercise: Nustep level 5 x 5 mins Heel raises against wall 2x10 Toe raises back against wall 2x10 LAQ Lt 2x10, Rt x10 Seated hamstring curl RTB 2x10 Rt Seated clamshell GTB 3x10 Sidelying hip abduction Rt x10 (small ROM) Supine quad set 5" hold 2x10 Rt Supine hip adduction ball squeeze 5" hold 2x10 SLR with QS x10 Rt (small ROM and pain)   [facility] Adult PT Treatment:                                                DATE: 10/16/22 Therapeutic Exercise: Nustep level 3 x 5 mins Heel raises  against wall 2x10 Toe raises back against wall 2x10 LAQ Lt 2x10 Seated hamstring curl RTB 2x10 Rt Supine clamshell RTB 3x10 (difficult with lack of Rt knee flexion) Sidelying hip abduction Rt 2x10 (small ROM) Supine quad set 5" hold x10 SLR with QS x10 (small ROM and pain) STS no UE Rt foot ahead x10 (pain)   [facility] Adult PT Treatment:                                                DATE: 10/07/2022 Therapeutic Exercise: Patellar mobs x 60" Quad set x 5 - 5" hold Supine heel slide x 5 - 5" hold Seated LAQ x 5   PATIENT EDUCATION:  Education details: eval findings, FOTO, HEP, POC Person educated: Patient Education method: Explanation, Demonstration, and Handouts Education comprehension: verbalized understanding and returned demonstration   HOME EXERCISE PROGRAM: Access Code: [email or web address] URL: [email or web address] Date: 10/07/2022 Prepared by: Octavio Manns   Exercises - Long Sitting 4 Way Patellar Glide  - 2 x daily - 7 x weekly - 2-3 sets - 60" hold - Supine Quadricep Sets  - 2 x daily - 7 x weekly - 2 sets - 10 reps - 5 sec hold - Supine Heel Slide  - 2 x daily - 7 x weekly - 2 sets - 10 reps - 5 sec hold - Seated Long Arc Quad  - 2 x daily - 7 x weekly - 2 sets - 10 reps   ASSESSMENT:   CLINICAL IMPRESSION: Continued focus on R knee strength and ROM, apprehensive about R knee flexion on Nustep.  Attempted to increase R knee ROM but patient apprehensive about flexing knee.  McConnell patellar tendon taping applied for pain relief.  Patient remains guarded about using R knee.  OBJECTIVE IMPAIRMENTS: decreased activity tolerance, decreased mobility, difficulty walking, decreased ROM, decreased strength, and pain.    ACTIVITY LIMITATIONS: sitting, standing, squatting, stairs, transfers, and locomotion level   PARTICIPATION LIMITATIONS: driving, shopping, community activity, and yard work  PERSONAL FACTORS: Fitness and 1 comorbidity: HTN  are also affecting patient's  functional outcome.      GOALS: Goals reviewed with patient? No   SHORT TERM GOALS: Target date: 10/28/2022   Pt will be compliant and knowledgeable with initial HEP for improved comfort and carryover Baseline: initial HEP given  Goal status: INITIAL   2.  Pt will self report bilateral knee pain no greater than 6/10 for improved comfort and functional ability Baseline: 8/10 at worst Goal status: INITIAL    LONG TERM GOALS: Target date: 12/02/2022   Pt will improve FOTO function score to no less than 58% as proxy for functional improvement Baseline: 29% function Goal status: INITIAL    2.  Pt will self report bilateral knee pain no greater than 3/10 for improved comfort and functional ability Baseline: 8/10 at worst Goal status: INITIAL    3.  Pt will increase 30 Second Sit to Stand rep count to no less than 8 reps for improved balance, strength, and functional mobility Baseline: 5 reps  Goal status: INITIAL    4.  Pt will improve R knee AROM to no less than 0-120 for improved comfort and functional mobility Baseline: 11-95 Goal status: INITIAL   5.  Pt will improve bilateral knee MMT to no less than 4/5 for all tested motions for improved comfort and function Baseline: see chart Goal status: INITIAL     PLAN:   PT FREQUENCY: 2x/week   PT DURATION: 8 weeks   PLANNED INTERVENTIONS: Therapeutic exercises, Therapeutic activity, Neuromuscular re-education, Balance training, Gait training, Patient/Family education, Self Care, Joint mobilization, Aquatic Therapy, Dry Needling, Electrical stimulation, Cryotherapy, Moist heat, Vasopneumatic device, Manual therapy, and Re-evaluation   PLAN FOR NEXT SESSION: assess HEP response, progress proximal hip and quad strength   Lanice Shirts, PT 10/29/2022, 7:18 PM

## 2022-10-29 ENCOUNTER — Encounter: Payer: Self-pay | Admitting: Nurse Practitioner

## 2022-10-29 ENCOUNTER — Ambulatory Visit (INDEPENDENT_AMBULATORY_CARE_PROVIDER_SITE_OTHER): Payer: Commercial Managed Care - PPO | Admitting: Nurse Practitioner

## 2022-10-29 ENCOUNTER — Ambulatory Visit: Payer: Commercial Managed Care - PPO

## 2022-10-29 VITALS — BP 126/80 | Ht 62.0 in | Wt 217.0 lb

## 2022-10-29 DIAGNOSIS — Z113 Encounter for screening for infections with a predominantly sexual mode of transmission: Secondary | ICD-10-CM | POA: Diagnosis not present

## 2022-10-29 DIAGNOSIS — M6281 Muscle weakness (generalized): Secondary | ICD-10-CM | POA: Diagnosis not present

## 2022-10-29 DIAGNOSIS — R2689 Other abnormalities of gait and mobility: Secondary | ICD-10-CM

## 2022-10-29 DIAGNOSIS — N898 Other specified noninflammatory disorders of vagina: Secondary | ICD-10-CM

## 2022-10-29 DIAGNOSIS — N760 Acute vaginitis: Secondary | ICD-10-CM | POA: Diagnosis not present

## 2022-10-29 DIAGNOSIS — M25561 Pain in right knee: Secondary | ICD-10-CM | POA: Diagnosis not present

## 2022-10-29 DIAGNOSIS — B9689 Other specified bacterial agents as the cause of diseases classified elsewhere: Secondary | ICD-10-CM

## 2022-10-29 LAB — WET PREP FOR TRICH, YEAST, CLUE

## 2022-10-29 MED ORDER — METRONIDAZOLE 500 MG PO TABS: 500.0000 mg | ORAL_TABLET | Freq: Two times a day (BID) | ORAL | 0 refills | Status: DC

## 2022-10-29 NOTE — Progress Notes (Signed)
   Acute Office Visit  Subjective:    Patient ID: [name], female    DOB: [birth year], 37 y.o.   MRN: [id number]   HPI 37 y.o. [id number] presents as new patient for yellow vaginal discharge since January. No itching or odor. Would like STD screening today.    Review of Systems  Constitutional: Negative.   Genitourinary:  Positive for vaginal discharge. Negative for vaginal pain.       Objective:    Physical Exam Constitutional:      Appearance: Normal appearance.  Genitourinary:    General: Normal vulva.     Vagina: Bleeding (Menses) present.     Cervix: Normal.     BP 126/80 (BP Location: Right Arm, Patient Position: Sitting, Cuff Size: Large)   Ht '5\' 2"'$  (1.575 m)   Wt 217 lb (98.4 kg)   LMP 10/29/2022   BMI 39.69 kg/m  Wt Readings from Last 3 Encounters:  10/29/22 217 lb (98.4 kg)  09/30/22 219 lb 6.4 oz (99.5 kg)  09/17/22 207 lb 3.2 oz (94 kg)        Patient informed chaperone available to be present for breast and/or pelvic exam. Patient has requested no chaperone to be present. Patient has been advised what will be completed during breast and pelvic exam.   Wet prep + clue cells (+ odor)  Assessment & Plan:   Problem List Items Addressed This Visit   None Visit Diagnoses     Bacterial vaginosis    -  Primary   Relevant Medications   metroNIDAZOLE (FLAGYL) 500 MG tablet   Vaginal discharge       Relevant Orders   WET PREP FOR [place], YEAST, CLUE   Screening examination for STD (sexually transmitted disease)       Relevant Orders   C. trachomatis/N. gonorrhoeae RNA   RPR   HIV Antibody (routine testing w rflx)      Plan: Wet prep positive for clue cells - Flagyl 500 mg BID x 7 days. STD panel pending.      Tamela Gammon DNP, 2:51 PM 10/29/2022

## 2022-10-30 LAB — RPR: RPR Ser Ql: NONREACTIVE

## 2022-10-30 LAB — HIV ANTIBODY (ROUTINE TESTING W REFLEX): HIV 1&2 Ab, 4th Generation: NONREACTIVE

## 2022-10-31 LAB — C. TRACHOMATIS/N. GONORRHOEAE RNA
C. trachomatis RNA, TMA: NOT DETECTED
N. gonorrhoeae RNA, TMA: NOT DETECTED

## 2022-11-04 ENCOUNTER — Ambulatory Visit: Payer: Commercial Managed Care - PPO

## 2022-11-04 ENCOUNTER — Telehealth: Payer: Self-pay | Admitting: Critical Care Medicine

## 2022-11-04 NOTE — Telephone Encounter (Signed)
Cb- [phone]  Pt is calling to ask does she need to follow up with an ov for the patient to return back to work after the completion of PT? Please leave a message on VM if unable to come to the phone

## 2022-11-04 NOTE — Telephone Encounter (Signed)
Needs to be seen in the next 4 to 6 weeks

## 2022-11-04 NOTE — Telephone Encounter (Signed)
Routing to PCP for review.

## 2022-11-05 NOTE — Telephone Encounter (Signed)
Called patient she already has appointment with [facility] on April 3 at 1010

## 2022-11-05 NOTE — Telephone Encounter (Signed)
FYI

## 2022-11-06 ENCOUNTER — Ambulatory Visit: Payer: Commercial Managed Care - PPO

## 2022-11-06 DIAGNOSIS — R2689 Other abnormalities of gait and mobility: Secondary | ICD-10-CM | POA: Diagnosis not present

## 2022-11-06 DIAGNOSIS — M6281 Muscle weakness (generalized): Secondary | ICD-10-CM | POA: Diagnosis not present

## 2022-11-06 DIAGNOSIS — M25561 Pain in right knee: Secondary | ICD-10-CM | POA: Diagnosis not present

## 2022-11-06 NOTE — Therapy (Signed)
OUTPATIENT PHYSICAL THERAPY TREATMENT NOTE   Patient Name: [name] MRN: [id number] DOB:03-01-1986, 37 y.o., female Today's Date: 11/06/2022  PCP: Elsie Stain, MD  REFERRING PROVIDER: Mcarthur Rossetti, MD   END OF SESSION:   PT End of Session - 11/06/22 1534     Visit Number 6    Number of Visits 17    Date for PT Re-Evaluation 12/02/22    Authorization Type [place] [employer]    PT Start Time 1532    PT Stop Time 1610    PT Time Calculation (min) 38 min    Activity Tolerance Patient tolerated treatment well;Patient limited by pain    Behavior During Therapy [facility] for tasks assessed/performed                 Past Medical History:  Diagnosis Date   Anemia    Chronic hypertension during pregnancy, antepartum 12/24/2017   '[x]'$  Aspirin 81 mg daily after 12 weeks; discontinue after 36 weeks Current antihypertensives:  None   Baseline and surveillance labs (pulled in from [facility], refresh links as needed)  Lab Results Component Value Date  PLT 353 11/25/2016  CREATININE 0.50 11/24/2017  AST 24 11/25/2016  ALT 17 11/25/2016  PROTCRRATIO 0.30 (H) 12/31/2015   Antenatal Testing CHTN - O10.919  Group I  BP < 140/90, no preecl   Hypertension    Pregnancy induced hypertension    Past Surgical History:  Procedure Laterality Date   TUBAL LIGATION Bilateral 07/20/2018   Procedure: POST PARTUM TUBAL LIGATION;  Surgeon: Truett Mainland, DO;  Location: [place];  Service: Gynecology;  Laterality: Bilateral;   Patient Active Problem List   Diagnosis Date Noted   Severe obesity (BMI >= 40) ([place]) 09/30/2022   Patellar tendinitis of right knee 09/26/2022   Vagina, candidiasis 05/15/2022   BMI 38.0-38.9,adult 06/24/2021   Anemia 03/20/2021   Hypertension     REFERRING DIAG: M17.12 (ICD-10-CM) - Unilateral primary osteoarthritis, left knee M17.11 (ICD-10-CM) - Unilateral primary osteoarthritis, right knee  THERAPY DIAG:  Acute pain of right knee  Other  abnormalities of gait and mobility  Muscle weakness (generalized)  Rationale for Evaluation and Treatment Rehabilitation  PERTINENT HISTORY: HTN  PRECAUTIONS: None  SUBJECTIVE:                                                                                                                                                                                      SUBJECTIVE STATEMENT: Pt presents to PT with reports of of decrease in knee pain. Has been compliant with HEP.    PAIN:  Are you having pain?  No: NPRS scale: 0/10 Worst: 8/10  Pain location: bilateral knees R>L Pain description: sharp Aggravating factors: kneeling, stairs, walking Relieving factors: rest, ice     OBJECTIVE: (objective measures completed at initial evaluation unless otherwise dated)   DIAGNOSTIC FINDINGS: See imaging   PATIENT SURVEYS:  FOTO: 29% function; 58% predicted   COGNITION: Overall cognitive status: Within functional limits for tasks assessed                         SENSATION: WFL   POSTURE: rounded shoulders and forward head   PALPATION: TTP to R distal quad and patellar tendon    LOWER EXTREMITY ROM:   Active ROM Right eval Left eval Right 10/23/22 Right 10/29/22  Hip flexion        Hip extension        Hip abduction        Hip adduction        Hip internal rotation        Hip external rotation        Knee flexion 95 WNL 102 115  Knee extension 10 WNL    Ankle dorsiflexion        Ankle plantarflexion        Ankle inversion        Ankle eversion         (Blank rows = not tested)   LOWER EXTREMITY MMT:   MMT Right eval Left eval  Hip flexion      Hip extension      Hip abduction      Hip adduction      Hip internal rotation      Hip external rotation      Knee flexion      Knee extension 2+/5 3+/5  Ankle dorsiflexion 3/5 4/5  Ankle plantarflexion      Ankle inversion      Ankle eversion       (Blank rows = not tested)   LOWER EXTREMITY SPECIAL TESTS:  Knee  special tests: Patellafemoral apprehension test: positive    FUNCTIONAL TESTS:  30 Second Sit to Stand: 5 reps   GAIT: Distance walked: 6f Assistive device utilized: None Level of assistance: Complete Independence Comments: antalgic gait R LE     TREATMENT: [facility] Adult PT Treatment:                                                DATE: 11/06/22 Therapeutic Exercise: Nustep level 5 x 4 min while taking subjective Quad set x 10 - 5" hold  SLR 2x15 each S/L hip abd x 10 each Seated knee ext 2x10 5# Seated knee flex 2x10 15# Knee flex/ext stretch on step x 10 each - 8in Step up x 10 6in fwd  Lateral walk RTB x 3 laps  TKE with ball x 10 each - 5" hold STS x 10 - no UE  [facility] Adult PT Treatment:                                                DATE: 10/29/22 Therapeutic Exercise: Nustep level 4 x 8 min R SLR 10x Heel slides over slide board with strap 10x(108d) SAQs 10x LAQs eccentric R 5# 15x Supine  hip fallouts GTB unilateral 15/15 Supine march 15/15 (to promote knee flexion) PF against wall 15x McConnell patellar tendon taping Taping applied to R knee using patellar tendon technique.  Patient instructed to remove tape with any increased pain, skin irritation or in the event the tape loosens and can not be re-applied. Cautioned to never apply Leukotape directly over skin without protective cover roll in place.    [facility] Adult PT Treatment:                                                DATE: 10/27/22 Therapeutic Exercise: Nustep level 5 x 5 min while taking subjective LAQ 2x10 2# Supine clamshell 3x10 black band Supine quad set x 10 - 5" hold Seated clamshell x 10 RTB Knee flex/ext stretch on step x 10 each - 6in Step up x 10 6in fwd  TKE with ball x 10 each - 5" hold Standing mini squat with UE 2x10  [facility] Adult PT Treatment:                                                DATE: 10/23/22 Therapeutic Exercise: Nustep level 5 x 5 mins Heel raises against wall 2x10 Toe raises  back against wall 2x10 LAQ Lt 2x10, Rt x10 Seated hamstring curl RTB 2x10 Rt Seated clamshell GTB 3x10 Sidelying hip abduction Rt x10 (small ROM) Supine quad set 5" hold 2x10 Rt Supine hip adduction ball squeeze 5" hold 2x10 SLR with QS x10 Rt (small ROM and pain)   [facility] Adult PT Treatment:                                                DATE: 10/16/22 Therapeutic Exercise: Nustep level 3 x 5 mins Heel raises against wall 2x10 Toe raises back against wall 2x10 LAQ Lt 2x10 Seated hamstring curl RTB 2x10 Rt Supine clamshell RTB 3x10 (difficult with lack of Rt knee flexion) Sidelying hip abduction Rt 2x10 (small ROM) Supine quad set 5" hold x10 SLR with QS x10 (small ROM and pain) STS no UE Rt foot ahead x10 (pain)   [facility] Adult PT Treatment:                                                DATE: 10/07/2022 Therapeutic Exercise: Patellar mobs x 60" Quad set x 5 - 5" hold Supine heel slide x 5 - 5" hold Seated LAQ x 5   PATIENT EDUCATION:  Education details: eval findings, FOTO, HEP, POC Person educated: Patient Education method: Explanation, Demonstration, and Handouts Education comprehension: verbalized understanding and returned demonstration   HOME EXERCISE PROGRAM: Access Code: [email or web address] URL: [email or web address] Date: 10/07/2022 Prepared by: Octavio Manns   Exercises - Long Sitting 4 Way Patellar Glide  - 2 x daily - 7 x weekly - 2-3 sets - 60" hold - Supine Quadricep Sets  - 2 x daily - 7 x weekly - 2 sets - 10 reps - 5  sec hold - Supine Heel Slide  - 2 x daily - 7 x weekly - 2 sets - 10 reps - 5 sec hold - Seated Long Arc Quad  - 2 x daily - 7 x weekly - 2 sets - 10 reps   ASSESSMENT:   CLINICAL IMPRESSION: Pt was able to complete all prescribed exercises with no adverse effect or increase in pain. Therapy focused on improving quad and proximal hip strength as well as R knee ROM. Continues to benefit from skilled PT, will continue per POC as prescribed.     OBJECTIVE IMPAIRMENTS: decreased activity tolerance, decreased mobility, difficulty walking, decreased ROM, decreased strength, and pain.    ACTIVITY LIMITATIONS: sitting, standing, squatting, stairs, transfers, and locomotion level   PARTICIPATION LIMITATIONS: driving, shopping, community activity, and yard work   PERSONAL FACTORS: Fitness and 1 comorbidity: HTN  are also affecting patient's functional outcome.      GOALS: Goals reviewed with patient? No   SHORT TERM GOALS: Target date: 10/28/2022   Pt will be compliant and knowledgeable with initial HEP for improved comfort and carryover Baseline: initial HEP given  Goal status: INITIAL   2.  Pt will self report bilateral knee pain no greater than 6/10 for improved comfort and functional ability Baseline: 8/10 at worst Goal status: INITIAL    LONG TERM GOALS: Target date: 12/02/2022   Pt will improve FOTO function score to no less than 58% as proxy for functional improvement Baseline: 29% function Goal status: INITIAL    2.  Pt will self report bilateral knee pain no greater than 3/10 for improved comfort and functional ability Baseline: 8/10 at worst Goal status: INITIAL    3.  Pt will increase 30 Second Sit to Stand rep count to no less than 8 reps for improved balance, strength, and functional mobility Baseline: 5 reps  Goal status: INITIAL    4.  Pt will improve R knee AROM to no less than 0-120 for improved comfort and functional mobility Baseline: 11-95 Goal status: INITIAL   5.  Pt will improve bilateral knee MMT to no less than 4/5 for all tested motions for improved comfort and function Baseline: see chart Goal status: INITIAL     PLAN:   PT FREQUENCY: 2x/week   PT DURATION: 8 weeks   PLANNED INTERVENTIONS: Therapeutic exercises, Therapeutic activity, Neuromuscular re-education, Balance training, Gait training, Patient/Family education, Self Care, Joint mobilization, Aquatic Therapy, Dry Needling,  Electrical stimulation, Cryotherapy, Moist heat, Vasopneumatic device, Manual therapy, and Re-evaluation   PLAN FOR NEXT SESSION: assess HEP response, progress proximal hip and quad strength   Ward Chatters, PT 11/06/2022, 4:13 PM

## 2022-11-09 NOTE — Therapy (Unsigned)
OUTPATIENT PHYSICAL THERAPY TREATMENT NOTE   Patient Name: [name] MRN: [id number] DOB:10/28/1985, 37 y.o., female Today's Date: 11/06/2022  PCP: Wright, Patrick E, MD  REFERRING PROVIDER: Blackman, Christopher Y, MD   END OF SESSION:   PT End of Session - 11/06/22 1534     Visit Number 6    Number of Visits 17    Date for PT Re-Evaluation 12/02/22    Authorization Type [place] [employer]    PT Start Time 1532    PT Stop Time 1610    PT Time Calculation (min) 38 min    Activity Tolerance Patient tolerated treatment well;Patient limited by pain    Behavior During Therapy WFL for tasks assessed/performed                 Past Medical History:  Diagnosis Date   Anemia    Chronic hypertension during pregnancy, antepartum 12/24/2017   [x] Aspirin 81 mg daily after 12 weeks; discontinue after 36 weeks Current antihypertensives:  None   Baseline and surveillance labs (pulled in from [facility], refresh links as needed)  Lab Results Component Value Date  PLT 353 11/25/2016  CREATININE 0.50 11/24/2017  AST 24 11/25/2016  ALT 17 11/25/2016  PROTCRRATIO 0.30 (H) 12/31/2015   Antenatal Testing CHTN - O10.919  Group I  BP < 140/90, no preecl   Hypertension    Pregnancy induced hypertension    Past Surgical History:  Procedure Laterality Date   TUBAL LIGATION Bilateral 07/20/2018   Procedure: POST PARTUM TUBAL LIGATION;  Surgeon: Stinson, Jacob J, DO;  Location: [facility];  Service: Gynecology;  Laterality: Bilateral;   Patient Active Problem List   Diagnosis Date Noted   Severe obesity (BMI >= 40) (HCC) 09/30/2022   Patellar tendinitis of right knee 09/26/2022   Vagina, candidiasis 05/15/2022   BMI 38.0-38.9,adult 06/24/2021   Anemia 03/20/2021   Hypertension     REFERRING DIAG: M17.12 (ICD-10-CM) - Unilateral primary osteoarthritis, left knee M17.11 (ICD-10-CM) - Unilateral primary osteoarthritis, right knee  THERAPY DIAG:  Acute pain of right knee  Other  abnormalities of gait and mobility  Muscle weakness (generalized)  Rationale for Evaluation and Treatment Rehabilitation  PERTINENT HISTORY: HTN  PRECAUTIONS: None  SUBJECTIVE:                                                                                                                                                                                      SUBJECTIVE STATEMENT: Pt presents to PT with reports of of decrease in knee pain. Has been compliant with HEP.    PAIN:  Are you having pain?  No: NPRS scale: 0/10 Worst: 8/10   Pain location: bilateral knees R>L Pain description: sharp Aggravating factors: kneeling, stairs, walking Relieving factors: rest, ice     OBJECTIVE: (objective measures completed at initial evaluation unless otherwise dated)   DIAGNOSTIC FINDINGS: See imaging   PATIENT SURVEYS:  FOTO: 29% function; 58% predicted   COGNITION: Overall cognitive status: Within functional limits for tasks assessed                         SENSATION: WFL   POSTURE: rounded shoulders and forward head   PALPATION: TTP to R distal quad and patellar tendon    LOWER EXTREMITY ROM:   Active ROM Right eval Left eval Right 10/23/22 Right 10/29/22  Hip flexion        Hip extension        Hip abduction        Hip adduction        Hip internal rotation        Hip external rotation        Knee flexion 95 WNL 102 115  Knee extension 10 WNL    Ankle dorsiflexion        Ankle plantarflexion        Ankle inversion        Ankle eversion         (Blank rows = not tested)   LOWER EXTREMITY MMT:   MMT Right eval Left eval  Hip flexion      Hip extension      Hip abduction      Hip adduction      Hip internal rotation      Hip external rotation      Knee flexion      Knee extension 2+/5 3+/5  Ankle dorsiflexion 3/5 4/5  Ankle plantarflexion      Ankle inversion      Ankle eversion       (Blank rows = not tested)   LOWER EXTREMITY SPECIAL TESTS:  Knee  special tests: Patellafemoral apprehension test: positive    FUNCTIONAL TESTS:  30 Second Sit to Stand: 5 reps   GAIT: Distance walked: 25ft Assistive device utilized: None Level of assistance: Complete Independence Comments: antalgic gait R LE     TREATMENT: [facility] Adult PT Treatment:                                                DATE: 11/06/22 Therapeutic Exercise: Nustep level 5 x 4 min while taking subjective Quad set x 10 - 5" hold  SLR 2x15 each S/L hip abd x 10 each Seated knee ext 2x10 5# Seated knee flex 2x10 15# Knee flex/ext stretch on step x 10 each - 8in Step up x 10 6in fwd  Lateral walk RTB x 3 laps  TKE with ball x 10 each - 5" hold STS x 10 - no UE  [facility] Adult PT Treatment:                                                DATE: 10/29/22 Therapeutic Exercise: Nustep level 4 x 8 min R SLR 10x Heel slides over slide board with strap 10x(108d) SAQs 10x LAQs eccentric R 5# 15x Supine   hip fallouts GTB unilateral 15/15 Supine march 15/15 (to promote knee flexion) PF against wall 15x McConnell patellar tendon taping Taping applied to R knee using patellar tendon technique.  Patient instructed to remove tape with any increased pain, skin irritation or in the event the tape loosens and can not be re-applied. Cautioned to never apply Leukotape directly over skin without protective cover roll in place.    [facility] Adult PT Treatment:                                                DATE: 10/27/22 Therapeutic Exercise: Nustep level 5 x 5 min while taking subjective LAQ 2x10 2# Supine clamshell 3x10 black band Supine quad set x 10 - 5" hold Seated clamshell x 10 RTB Knee flex/ext stretch on step x 10 each - 6in Step up x 10 6in fwd  TKE with ball x 10 each - 5" hold Standing mini squat with UE 2x10  [facility] Adult PT Treatment:                                                DATE: 10/23/22 Therapeutic Exercise: Nustep level 5 x 5 mins Heel raises against wall 2x10 Toe raises  back against wall 2x10 LAQ Lt 2x10, Rt x10 Seated hamstring curl RTB 2x10 Rt Seated clamshell GTB 3x10 Sidelying hip abduction Rt x10 (small ROM) Supine quad set 5" hold 2x10 Rt Supine hip adduction ball squeeze 5" hold 2x10 SLR with QS x10 Rt (small ROM and pain)   [facility] Adult PT Treatment:                                                DATE: 10/16/22 Therapeutic Exercise: Nustep level 3 x 5 mins Heel raises against wall 2x10 Toe raises back against wall 2x10 LAQ Lt 2x10 Seated hamstring curl RTB 2x10 Rt Supine clamshell RTB 3x10 (difficult with lack of Rt knee flexion) Sidelying hip abduction Rt 2x10 (small ROM) Supine quad set 5" hold x10 SLR with QS x10 (small ROM and pain) STS no UE Rt foot ahead x10 (pain)   [facility] Adult PT Treatment:                                                DATE: 10/07/2022 Therapeutic Exercise: Patellar mobs x 60" Quad set x 5 - 5" hold Supine heel slide x 5 - 5" hold Seated LAQ x 5   PATIENT EDUCATION:  Education details: eval findings, FOTO, HEP, POC Person educated: Patient Education method: Explanation, Demonstration, and Handouts Education comprehension: verbalized understanding and returned demonstration   HOME EXERCISE PROGRAM: Access Code: [email or web address] URL: [email or web address] Date: 10/07/2022 Prepared by: David Stroup   Exercises - Long Sitting 4 Way Patellar Glide  - 2 x daily - 7 x weekly - 2-3 sets - 60" hold - Supine Quadricep Sets  - 2 x daily - 7 x weekly - 2 sets - 10 reps - 5   sec hold - Supine Heel Slide  - 2 x daily - 7 x weekly - 2 sets - 10 reps - 5 sec hold - Seated Long Arc Quad  - 2 x daily - 7 x weekly - 2 sets - 10 reps   ASSESSMENT:   CLINICAL IMPRESSION: Pt was able to complete all prescribed exercises with no adverse effect or increase in pain. Therapy focused on improving quad and proximal hip strength as well as R knee ROM. Continues to benefit from skilled PT, will continue per POC as prescribed.     OBJECTIVE IMPAIRMENTS: decreased activity tolerance, decreased mobility, difficulty walking, decreased ROM, decreased strength, and pain.    ACTIVITY LIMITATIONS: sitting, standing, squatting, stairs, transfers, and locomotion level   PARTICIPATION LIMITATIONS: driving, shopping, community activity, and yard work   PERSONAL FACTORS: Fitness and 1 comorbidity: HTN  are also affecting patient's functional outcome.      GOALS: Goals reviewed with patient? No   SHORT TERM GOALS: Target date: 10/28/2022   Pt will be compliant and knowledgeable with initial HEP for improved comfort and carryover Baseline: initial HEP given  Goal status: INITIAL   2.  Pt will self report bilateral knee pain no greater than 6/10 for improved comfort and functional ability Baseline: 8/10 at worst Goal status: INITIAL    LONG TERM GOALS: Target date: 12/02/2022   Pt will improve FOTO function score to no less than 58% as proxy for functional improvement Baseline: 29% function Goal status: INITIAL    2.  Pt will self report bilateral knee pain no greater than 3/10 for improved comfort and functional ability Baseline: 8/10 at worst Goal status: INITIAL    3.  Pt will increase 30 Second Sit to Stand rep count to no less than 8 reps for improved balance, strength, and functional mobility Baseline: 5 reps  Goal status: INITIAL    4.  Pt will improve R knee AROM to no less than 0-120 for improved comfort and functional mobility Baseline: 11-95 Goal status: INITIAL   5.  Pt will improve bilateral knee MMT to no less than 4/5 for all tested motions for improved comfort and function Baseline: see chart Goal status: INITIAL     PLAN:   PT FREQUENCY: 2x/week   PT DURATION: 8 weeks   PLANNED INTERVENTIONS: Therapeutic exercises, Therapeutic activity, Neuromuscular re-education, Balance training, Gait training, Patient/Family education, Self Care, Joint mobilization, Aquatic Therapy, Dry Needling,  Electrical stimulation, Cryotherapy, Moist heat, Vasopneumatic device, Manual therapy, and Re-evaluation   PLAN FOR NEXT SESSION: assess HEP response, progress proximal hip and quad strength   David C Stroup, PT 11/06/2022, 4:13 PM    

## 2022-11-10 ENCOUNTER — Ambulatory Visit: Payer: Commercial Managed Care - PPO

## 2022-11-10 DIAGNOSIS — M6281 Muscle weakness (generalized): Secondary | ICD-10-CM | POA: Diagnosis not present

## 2022-11-10 DIAGNOSIS — M25561 Pain in right knee: Secondary | ICD-10-CM

## 2022-11-10 DIAGNOSIS — R2689 Other abnormalities of gait and mobility: Secondary | ICD-10-CM

## 2022-11-10 NOTE — Therapy (Signed)
OUTPATIENT PHYSICAL THERAPY TREATMENT NOTE   Patient Name: [name] MRN: [id number] DOB:June 22, 1986, 37 y.o., female Today's Date: 11/10/2022  PCP: Elsie Stain, MD  REFERRING PROVIDER: Mcarthur Rossetti, MD   END OF SESSION:   PT End of Session - 11/10/22 1533     Visit Number 7    Number of Visits 17    Date for PT Re-Evaluation 12/02/22    Authorization Type [place] [employer]    PT Start Time 1531    PT Stop Time 1609    PT Time Calculation (min) 38 min    Activity Tolerance Patient tolerated treatment well;Patient limited by pain    Behavior During Therapy [facility] for tasks assessed/performed                  Past Medical History:  Diagnosis Date   Anemia    Chronic hypertension during pregnancy, antepartum 12/24/2017   [x]  Aspirin 81 mg daily after 12 weeks; discontinue after 36 weeks Current antihypertensives:  None   Baseline and surveillance labs (pulled in from [facility], refresh links as needed)  Lab Results Component Value Date  PLT 353 11/25/2016  CREATININE 0.50 11/24/2017  AST 24 11/25/2016  ALT 17 11/25/2016  PROTCRRATIO 0.30 (H) 12/31/2015   Antenatal Testing CHTN - O10.919  Group I  BP < 140/90, no preecl   Hypertension    Pregnancy induced hypertension    Past Surgical History:  Procedure Laterality Date   TUBAL LIGATION Bilateral 07/20/2018   Procedure: POST PARTUM TUBAL LIGATION;  Surgeon: Truett Mainland, DO;  Location: [place];  Service: Gynecology;  Laterality: Bilateral;   Patient Active Problem List   Diagnosis Date Noted   Severe obesity (BMI >= 40) ([place]) 09/30/2022   Patellar tendinitis of right knee 09/26/2022   Vagina, candidiasis 05/15/2022   BMI 38.0-38.9,adult 06/24/2021   Anemia 03/20/2021   Hypertension     REFERRING DIAG: M17.12 (ICD-10-CM) - Unilateral primary osteoarthritis, left knee M17.11 (ICD-10-CM) - Unilateral primary osteoarthritis, right knee  THERAPY DIAG:  Acute pain of right knee  Other  abnormalities of gait and mobility  Muscle weakness (generalized)  Rationale for Evaluation and Treatment Rehabilitation  PERTINENT HISTORY: HTN  PRECAUTIONS: None  SUBJECTIVE:                                                                                                                                                                                      SUBJECTIVE STATEMENT: Pt presents to PT with reports of continued improvement. Has been compliant with HEP.    PAIN:  Are you having pain?  No: NPRS scale: 0/10 Worst: 8/10 Pain location:  bilateral knees R>L Pain description: sharp Aggravating factors: kneeling, stairs, walking Relieving factors: rest, ice     OBJECTIVE: (objective measures completed at initial evaluation unless otherwise dated)   DIAGNOSTIC FINDINGS: See imaging   PATIENT SURVEYS:  FOTO: 29% function; 58% predicted   COGNITION: Overall cognitive status: Within functional limits for tasks assessed                         SENSATION: WFL   POSTURE: rounded shoulders and forward head   PALPATION: TTP to R distal quad and patellar tendon    LOWER EXTREMITY ROM:   Active ROM Right eval Left eval Right 10/23/22 Right 10/29/22  Hip flexion        Hip extension        Hip abduction        Hip adduction        Hip internal rotation        Hip external rotation        Knee flexion 95 WNL 102 115  Knee extension 10 WNL    Ankle dorsiflexion        Ankle plantarflexion        Ankle inversion        Ankle eversion         (Blank rows = not tested)   LOWER EXTREMITY MMT:   MMT Right eval Left eval  Hip flexion      Hip extension      Hip abduction      Hip adduction      Hip internal rotation      Hip external rotation      Knee flexion      Knee extension 2+/5 3+/5  Ankle dorsiflexion 3/5 4/5  Ankle plantarflexion      Ankle inversion      Ankle eversion       (Blank rows = not tested)   LOWER EXTREMITY SPECIAL TESTS:  Knee  special tests: Patellafemoral apprehension test: positive    FUNCTIONAL TESTS:  30 Second Sit to Stand: 5 reps   GAIT: Distance walked: 110ft Assistive device utilized: None Level of assistance: Complete Independence Comments: antalgic gait R LE     TREATMENT: [facility] Adult PT Treatment:                                                DATE: 11/10/22 Therapeutic Exercise: Nustep level 6 x 5 min while taking subjective Lateral walk YTB x 3 laps in // Standing hip abd 2x10 YTB Step ups 2x10 fwd 8in Quad set x 10 - 5" hold  SLR 2x10 each S/L hip abd x 10 each Seated knee ext 2x10 5# Seated knee flex 2x10 20# Wall squat 2x10  TKE with ball x 10 each - 5" hold  [facility] Adult PT Treatment:                                                DATE: 11/06/22 Therapeutic Exercise: Nustep level 5 x 4 min while taking subjective Quad set x 10 - 5" hold  SLR 2x15 each S/L hip abd x 10 each Seated knee ext 2x10 5# Seated knee flex 2x10  15# Knee flex/ext stretch on step x 10 each - 8in Step up x 10 6in fwd  Lateral walk RTB x 3 laps  TKE with ball x 10 each - 5" hold STS x 10 - no UE  [facility] Adult PT Treatment:                                                DATE: 10/29/22 Therapeutic Exercise: Nustep level 4 x 8 min R SLR 10x Heel slides over slide board with strap 10x(108d) SAQs 10x LAQs eccentric R 5# 15x Supine hip fallouts GTB unilateral 15/15 Supine march 15/15 (to promote knee flexion) PF against wall 15x McConnell patellar tendon taping Taping applied to R knee using patellar tendon technique.  Patient instructed to remove tape with any increased pain, skin irritation or in the event the tape loosens and can not be re-applied. Cautioned to never apply Leukotape directly over skin without protective cover roll in place.    [place] Adult PT Treatment:                                                DATE: 10/27/22 Therapeutic Exercise: Nustep level 5 x 5 min while taking subjective LAQ 2x10  2# Supine clamshell 3x10 black band Supine quad set x 10 - 5" hold Seated clamshell x 10 RTB Knee flex/ext stretch on step x 10 each - 6in Step up x 10 6in fwd  TKE with ball x 10 each - 5" hold Standing mini squat with UE 2x10  [facility] Adult PT Treatment:                                                DATE: 10/23/22 Therapeutic Exercise: Nustep level 5 x 5 mins Heel raises against wall 2x10 Toe raises back against wall 2x10 LAQ Lt 2x10, Rt x10 Seated hamstring curl RTB 2x10 Rt Seated clamshell GTB 3x10 Sidelying hip abduction Rt x10 (small ROM) Supine quad set 5" hold 2x10 Rt Supine hip adduction ball squeeze 5" hold 2x10 SLR with QS x10 Rt (small ROM and pain)   [facility] Adult PT Treatment:                                                DATE: 10/16/22 Therapeutic Exercise: Nustep level 3 x 5 mins Heel raises against wall 2x10 Toe raises back against wall 2x10 LAQ Lt 2x10 Seated hamstring curl RTB 2x10 Rt Supine clamshell RTB 3x10 (difficult with lack of Rt knee flexion) Sidelying hip abduction Rt 2x10 (small ROM) Supine quad set 5" hold x10 SLR with QS x10 (small ROM and pain) STS no UE Rt foot ahead x10 (pain)   [facility] Adult PT Treatment:  DATE: 10/07/2022 Therapeutic Exercise: Patellar mobs x 60" Quad set x 5 - 5" hold Supine heel slide x 5 - 5" hold Seated LAQ x 5   PATIENT EDUCATION:  Education details: eval findings, FOTO, HEP, POC Person educated: Patient Education method: Explanation, Demonstration, and Handouts Education comprehension: verbalized understanding and returned demonstration   HOME EXERCISE PROGRAM: Access Code: [email or web address] URL: [email or web address] Date: 10/07/2022 Prepared by: Octavio Manns   Exercises - Long Sitting 4 Way Patellar Glide  - 2 x daily - 7 x weekly - 2-3 sets - 60" hold - Supine Quadricep Sets  - 2 x daily - 7 x weekly - 2 sets - 10 reps - 5 sec hold - Supine Heel Slide  - 2  x daily - 7 x weekly - 2 sets - 10 reps - 5 sec hold - Seated Long Arc Quad  - 2 x daily - 7 x weekly - 2 sets - 10 reps   ASSESSMENT:   CLINICAL IMPRESSION:  Pt was able to complete all prescribed exercises with no adverse effect or increase in pain. Therapy focused on improving quad and proximal hip strength as well as R knee ROM. Continues to benefit from skilled PT, will continue per POC as prescribed.    OBJECTIVE IMPAIRMENTS: decreased activity tolerance, decreased mobility, difficulty walking, decreased ROM, decreased strength, and pain.    ACTIVITY LIMITATIONS: sitting, standing, squatting, stairs, transfers, and locomotion level   PARTICIPATION LIMITATIONS: driving, shopping, community activity, and yard work   PERSONAL FACTORS: Fitness and 1 comorbidity: HTN  are also affecting patient's functional outcome.      GOALS: Goals reviewed with patient? No   SHORT TERM GOALS: Target date: 10/28/2022   Pt will be compliant and knowledgeable with initial HEP for improved comfort and carryover Baseline: initial HEP given  Goal status: INITIAL   2.  Pt will self report bilateral knee pain no greater than 6/10 for improved comfort and functional ability Baseline: 8/10 at worst Goal status: INITIAL    LONG TERM GOALS: Target date: 12/02/2022   Pt will improve FOTO function score to no less than 58% as proxy for functional improvement Baseline: 29% function Goal status: INITIAL    2.  Pt will self report bilateral knee pain no greater than 3/10 for improved comfort and functional ability Baseline: 8/10 at worst Goal status: INITIAL    3.  Pt will increase 30 Second Sit to Stand rep count to no less than 8 reps for improved balance, strength, and functional mobility Baseline: 5 reps  Goal status: INITIAL    4.  Pt will improve R knee AROM to no less than 0-120 for improved comfort and functional mobility Baseline: 11-95 Goal status: INITIAL   5.  Pt will improve bilateral  knee MMT to no less than 4/5 for all tested motions for improved comfort and function Baseline: see chart Goal status: INITIAL     PLAN:   PT FREQUENCY: 2x/week   PT DURATION: 8 weeks   PLANNED INTERVENTIONS: Therapeutic exercises, Therapeutic activity, Neuromuscular re-education, Balance training, Gait training, Patient/Family education, Self Care, Joint mobilization, Aquatic Therapy, Dry Needling, Electrical stimulation, Cryotherapy, Moist heat, Vasopneumatic device, Manual therapy, and Re-evaluation   PLAN FOR NEXT SESSION: assess HEP response, progress proximal hip and quad strength   Ward Chatters, PT 11/10/2022, 4:09 PM

## 2022-11-12 ENCOUNTER — Ambulatory Visit: Payer: Commercial Managed Care - PPO

## 2022-11-12 DIAGNOSIS — R2689 Other abnormalities of gait and mobility: Secondary | ICD-10-CM

## 2022-11-12 DIAGNOSIS — M6281 Muscle weakness (generalized): Secondary | ICD-10-CM

## 2022-11-12 DIAGNOSIS — M25561 Pain in right knee: Secondary | ICD-10-CM

## 2022-11-12 NOTE — Therapy (Signed)
OUTPATIENT PHYSICAL THERAPY TREATMENT NOTE   Patient Name: [name] MRN: [id number] DOB:05-07-1986, 37 y.o., female Today's Date: 11/12/2022  PCP: Elsie Stain, MD  REFERRING PROVIDER: Mcarthur Rossetti, MD   END OF SESSION:   PT End of Session - 11/12/22 1531     Visit Number 8    Number of Visits 17    Date for PT Re-Evaluation 12/02/22    Authorization Type [place] [employer]    PT Start Time 1531    PT Stop Time 1609    PT Time Calculation (min) 38 min    Activity Tolerance Patient tolerated treatment well;Patient limited by pain    Behavior During Therapy [facility] for tasks assessed/performed                   Past Medical History:  Diagnosis Date   Anemia    Chronic hypertension during pregnancy, antepartum 12/24/2017   [x]  Aspirin 81 mg daily after 12 weeks; discontinue after 36 weeks Current antihypertensives:  None   Baseline and surveillance labs (pulled in from [facility], refresh links as needed)  Lab Results Component Value Date  PLT 353 11/25/2016  CREATININE 0.50 11/24/2017  AST 24 11/25/2016  ALT 17 11/25/2016  PROTCRRATIO 0.30 (H) 12/31/2015   Antenatal Testing CHTN - O10.919  Group I  BP < 140/90, no preecl   Hypertension    Pregnancy induced hypertension    Past Surgical History:  Procedure Laterality Date   TUBAL LIGATION Bilateral 07/20/2018   Procedure: POST PARTUM TUBAL LIGATION;  Surgeon: Truett Mainland, DO;  Location: [place];  Service: Gynecology;  Laterality: Bilateral;   Patient Active Problem List   Diagnosis Date Noted   Severe obesity (BMI >= 40) ([place]) 09/30/2022   Patellar tendinitis of right knee 09/26/2022   Vagina, candidiasis 05/15/2022   BMI 38.0-38.9,adult 06/24/2021   Anemia 03/20/2021   Hypertension     REFERRING DIAG: M17.12 (ICD-10-CM) - Unilateral primary osteoarthritis, left knee M17.11 (ICD-10-CM) - Unilateral primary osteoarthritis, right knee  THERAPY DIAG:  Acute pain of right knee  Other  abnormalities of gait and mobility  Muscle weakness (generalized)  Rationale for Evaluation and Treatment Rehabilitation  PERTINENT HISTORY: HTN  PRECAUTIONS: None  SUBJECTIVE:                                                                                                                                                                                      SUBJECTIVE STATEMENT: Pt presents to PT with no current reports of pain or discomfort. Has been compliant with HEP.    PAIN:  Are you having pain?  No: NPRS scale: 0/10  Worst: 8/10 Pain location: bilateral knees R>L Pain description: sharp Aggravating factors: kneeling, stairs, walking Relieving factors: rest, ice     OBJECTIVE: (objective measures completed at initial evaluation unless otherwise dated)   DIAGNOSTIC FINDINGS: See imaging   PATIENT SURVEYS:  FOTO: 29% function; 58% predicted   COGNITION: Overall cognitive status: Within functional limits for tasks assessed                         SENSATION: WFL   POSTURE: rounded shoulders and forward head   PALPATION: TTP to R distal quad and patellar tendon    LOWER EXTREMITY ROM:   Active ROM Right eval Left eval Right 10/23/22 Right 10/29/22  Hip flexion        Hip extension        Hip abduction        Hip adduction        Hip internal rotation        Hip external rotation        Knee flexion 95 WNL 102 115  Knee extension 10 WNL    Ankle dorsiflexion        Ankle plantarflexion        Ankle inversion        Ankle eversion         (Blank rows = not tested)   LOWER EXTREMITY MMT:   MMT Right eval Left eval  Hip flexion      Hip extension      Hip abduction      Hip adduction      Hip internal rotation      Hip external rotation      Knee flexion  3/5 4/5   Knee extension 2+/5 3+/5  Ankle dorsiflexion    Ankle plantarflexion      Ankle inversion      Ankle eversion       (Blank rows = not tested)   LOWER EXTREMITY SPECIAL TESTS:   Knee special tests: Patellafemoral apprehension test: positive    FUNCTIONAL TESTS:  30 Second Sit to Stand: 5 reps   GAIT: Distance walked: 10ft Assistive device utilized: None Level of assistance: Complete Independence Comments: antalgic gait R LE     TREATMENT: [facility] Adult PT Treatment:                                                DATE: 11/12/22 Therapeutic Exercise: Nustep level 6 x 5 min while taking subjective Lateral walk RTB x 3 laps in // Standing hip abd/ext x 10 RTB Step ups 2x10 fwd 8in TKE 2x10 7# Quad set x 10 - 5" hold  SLR 2x10 each Seated clamshell 3x15 black band Seated knee ext 2x10 10# Seated knee flex 2x10 20#  [facility] Adult PT Treatment:                                                DATE: 11/10/22 Therapeutic Exercise: Nustep level 6 x 5 min while taking subjective Lateral walk YTB x 3 laps in // Standing hip abd 2x10 YTB Step ups 2x10 fwd 8in Quad set x 10 - 5" hold  SLR 2x10 each S/L hip abd  x 10 each Seated knee ext 2x10 5# Seated knee flex 2x10 20# Wall squat 2x10  TKE with ball x 10 each - 5" hold  [facility] Adult PT Treatment:                                                DATE: 11/06/22 Therapeutic Exercise: Nustep level 5 x 4 min while taking subjective Quad set x 10 - 5" hold  SLR 2x15 each S/L hip abd x 10 each Seated knee ext 2x10 5# Seated knee flex 2x10 15# Knee flex/ext stretch on step x 10 each - 8in Step up x 10 6in fwd  Lateral walk RTB x 3 laps  TKE with ball x 10 each - 5" hold STS x 10 - no UE  PATIENT EDUCATION:  Education details: eval findings, FOTO, HEP, POC Person educated: Patient Education method: Explanation, Demonstration, and Handouts Education comprehension: verbalized understanding and returned demonstration   HOME EXERCISE PROGRAM: Access Code: [email or web address] URL: [email or web address] Date: 10/07/2022 Prepared by: Octavio Manns   Exercises - Long Sitting 4 Way Patellar Glide  - 2 x daily - 7 x  weekly - 2-3 sets - 60" hold - Supine Quadricep Sets  - 2 x daily - 7 x weekly - 2 sets - 10 reps - 5 sec hold - Supine Heel Slide  - 2 x daily - 7 x weekly - 2 sets - 10 reps - 5 sec hold - Seated Long Arc Quad  - 2 x daily - 7 x weekly - 2 sets - 10 reps   ASSESSMENT:   CLINICAL IMPRESSION:  Pt was able to complete all prescribed exercises with no adverse effect or increase in pain. Therapy focused on improving quad and proximal hip strength as well as R knee ROM. Continues to benefit from skilled PT, will continue per POC as prescribed.    OBJECTIVE IMPAIRMENTS: decreased activity tolerance, decreased mobility, difficulty walking, decreased ROM, decreased strength, and pain.    ACTIVITY LIMITATIONS: sitting, standing, squatting, stairs, transfers, and locomotion level   PARTICIPATION LIMITATIONS: driving, shopping, community activity, and yard work   PERSONAL FACTORS: Fitness and 1 comorbidity: HTN  are also affecting patient's functional outcome.      GOALS: Goals reviewed with patient? No   SHORT TERM GOALS: Target date: 10/28/2022   Pt will be compliant and knowledgeable with initial HEP for improved comfort and carryover Baseline: initial HEP given  Goal status: INITIAL   2.  Pt will self report bilateral knee pain no greater than 6/10 for improved comfort and functional ability Baseline: 8/10 at worst Goal status: INITIAL    LONG TERM GOALS: Target date: 12/02/2022   Pt will improve FOTO function score to no less than 58% as proxy for functional improvement Baseline: 29% function Goal status: INITIAL    2.  Pt will self report bilateral knee pain no greater than 3/10 for improved comfort and functional ability Baseline: 8/10 at worst Goal status: INITIAL    3.  Pt will increase 30 Second Sit to Stand rep count to no less than 8 reps for improved balance, strength, and functional mobility Baseline: 5 reps  Goal status: INITIAL    4.  Pt will improve R knee AROM to no  less than 0-120 for improved comfort and functional mobility Baseline: 11-95 Goal status: INITIAL   5.  Pt will improve bilateral knee MMT to no less than 4/5 for all tested motions for improved comfort and function Baseline: see chart Goal status: INITIAL     PLAN:   PT FREQUENCY: 2x/week   PT DURATION: 8 weeks   PLANNED INTERVENTIONS: Therapeutic exercises, Therapeutic activity, Neuromuscular re-education, Balance training, Gait training, Patient/Family education, Self Care, Joint mobilization, Aquatic Therapy, Dry Needling, Electrical stimulation, Cryotherapy, Moist heat, Vasopneumatic device, Manual therapy, and Re-evaluation   PLAN FOR NEXT SESSION: assess HEP response, progress proximal hip and quad strength   Ward Chatters, PT 11/12/2022, 4:10 PM

## 2022-11-18 ENCOUNTER — Ambulatory Visit: Payer: Commercial Managed Care - PPO

## 2022-11-19 ENCOUNTER — Telehealth: Payer: Self-pay

## 2022-11-19 NOTE — Telephone Encounter (Signed)
Spoke to patient regarding missed appointment, she stated she got her dates/times mixed up. Confirmed next appointment time.  [name], PTA 11/19/22 10:42 AM

## 2022-11-20 ENCOUNTER — Ambulatory Visit: Payer: Commercial Managed Care - PPO

## 2022-11-25 ENCOUNTER — Ambulatory Visit: Payer: Commercial Managed Care - PPO | Attending: Critical Care Medicine

## 2022-11-25 DIAGNOSIS — M6281 Muscle weakness (generalized): Secondary | ICD-10-CM | POA: Diagnosis not present

## 2022-11-25 DIAGNOSIS — M25561 Pain in right knee: Secondary | ICD-10-CM | POA: Diagnosis not present

## 2022-11-25 DIAGNOSIS — R2689 Other abnormalities of gait and mobility: Secondary | ICD-10-CM | POA: Diagnosis not present

## 2022-11-25 NOTE — Therapy (Addendum)
OUTPATIENT PHYSICAL THERAPY TREATMENT NOTE/DISCHARGE  PHYSICAL THERAPY DISCHARGE SUMMARY  Visits from Start of Care: 9  Current functional level related to goals / functional outcomes: See goals/objective   Remaining deficits: Unable to assess   Education / Equipment: HEP   Patient agrees to discharge. Patient goals were unable to assess. Patient is being discharged due to not returning since the last visit.     Patient Name: [name] MRN: [id number] DOB:16-Sep-1985, 37 y.o., female Today's Date: 11/25/2022  PCP: Storm Frisk, MD  REFERRING PROVIDER: Kathryne Hitch, MD   END OF SESSION:   PT End of Session - 11/25/22 1749     Visit Number 9    Number of Visits 17    Date for PT Re-Evaluation 12/02/22    Authorization Type [place] [employer]    PT Start Time 1747    PT Stop Time 1825    PT Time Calculation (min) 38 min    Activity Tolerance Patient tolerated treatment well    Behavior During Therapy [facility] for tasks assessed/performed              Past Medical History:  Diagnosis Date   Anemia    Chronic hypertension during pregnancy, antepartum 12/24/2017   [x]  Aspirin 81 mg daily after 12 weeks; discontinue after 36 weeks Current antihypertensives:  None   Baseline and surveillance labs (pulled in from [facility], refresh links as needed)  Lab Results Component Value Date  PLT 353 11/25/2016  CREATININE 0.50 11/24/2017  AST 24 11/25/2016  ALT 17 11/25/2016  PROTCRRATIO 0.30 (H) 12/31/2015   Antenatal Testing CHTN - O10.919  Group I  BP < 140/90, no preecl   Hypertension    Pregnancy induced hypertension    Past Surgical History:  Procedure Laterality Date   TUBAL LIGATION Bilateral 07/20/2018   Procedure: POST PARTUM TUBAL LIGATION;  Surgeon: Levie Heritage, DO;  Location: [facility];  Service: Gynecology;  Laterality: Bilateral;   Patient Active Problem List   Diagnosis Date Noted   Severe obesity (BMI >= 40) 09/30/2022   Patellar  tendinitis of right knee 09/26/2022   Vagina, candidiasis 05/15/2022   BMI 38.0-38.9,adult 06/24/2021   Anemia 03/20/2021   Hypertension     REFERRING DIAG: M17.12 (ICD-10-CM) - Unilateral primary osteoarthritis, left knee M17.11 (ICD-10-CM) - Unilateral primary osteoarthritis, right knee  THERAPY DIAG:  Acute pain of right knee  Other abnormalities of gait and mobility  Muscle weakness (generalized)  Rationale for Evaluation and Treatment Rehabilitation  PERTINENT HISTORY: HTN  PRECAUTIONS: None  SUBJECTIVE:  SUBJECTIVE STATEMENT: Patient reports no current pain, and only to a 5/10 at the highest.    PAIN:  Are you having pain?  No: NPRS scale: 0/10 Worst: 5/10 Pain location: bilateral knees R>L Pain description: sharp Aggravating factors: kneeling, stairs, walking Relieving factors: rest, ice     OBJECTIVE: (objective measures completed at initial evaluation unless otherwise dated)   DIAGNOSTIC FINDINGS: See imaging   PATIENT SURVEYS:  FOTO: 29% function; 58% predicted   COGNITION: Overall cognitive status: Within functional limits for tasks assessed                         SENSATION: WFL   POSTURE: rounded shoulders and forward head   PALPATION: TTP to R distal quad and patellar tendon    LOWER EXTREMITY ROM:   Active ROM Right eval Left eval Right 10/23/22 Right 10/29/22  Hip flexion        Hip extension        Hip abduction        Hip adduction        Hip internal rotation        Hip external rotation        Knee flexion 95 WNL 102 115  Knee extension 10 WNL    Ankle dorsiflexion        Ankle plantarflexion        Ankle inversion        Ankle eversion         (Blank rows = not tested)   LOWER EXTREMITY MMT:   MMT Right eval Left eval Right 11/25/22  Left 11/25/22  Hip flexion        Hip extension        Hip abduction        Hip adduction        Hip internal rotation        Hip external rotation        Knee flexion  3/5 4/5     Knee extension 2+/5 3+/5 4/5 4/5  Ankle dorsiflexion      Ankle plantarflexion        Ankle inversion        Ankle eversion         (Blank rows = not tested)   LOWER EXTREMITY SPECIAL TESTS:  Knee special tests: Patellafemoral apprehension test: positive    FUNCTIONAL TESTS:  30 Second Sit to Stand: 5 reps   GAIT: Distance walked: 52ft Assistive device utilized: None Level of assistance: Complete Independence Comments: antalgic gait R LE     TREATMENT: [facility] Adult PT Treatment:                                                DATE: 11/25/22 Therapeutic Exercise: Nustep level 6 x 5 min while taking subjective Lateral walk RTB at ankles x 3 laps at counter Standing hip abd/ext x 10 each BIL RTB Step ups 2x10 fwd with alternating march 8in SLR 2x10 each Seated knee ext 2x10 10# Seated knee flex 2x10 25# Hooklying clamshell BlackTB 3x10 STS holding 10# KB x10   [facility] Adult PT Treatment:  DATE: 11/12/22 Therapeutic Exercise: Nustep level 6 x 5 min while taking subjective Lateral walk RTB x 3 laps in // Standing hip abd/ext x 10 RTB Step ups 2x10 fwd 8in TKE 2x10 7# Quad set x 10 - 5" hold  SLR 2x10 each Seated clamshell 3x15 black band Seated knee ext 2x10 10# Seated knee flex 2x10 20#  [facility] Adult PT Treatment:                                                DATE: 11/10/22 Therapeutic Exercise: Nustep level 6 x 5 min while taking subjective Lateral walk YTB x 3 laps in // Standing hip abd 2x10 YTB Step ups 2x10 fwd 8in Quad set x 10 - 5" hold  SLR 2x10 each S/L hip abd x 10 each Seated knee ext 2x10 5# Seated knee flex 2x10 20# Wall squat 2x10  TKE with ball x 10 each - 5" hold   PATIENT EDUCATION:  Education details: eval findings, FOTO,  HEP, POC Person educated: Patient Education method: Explanation, Demonstration, and Handouts Education comprehension: verbalized understanding and returned demonstration   HOME EXERCISE PROGRAM: Access Code: [email or web address] URL: [email or web address] Date: 10/07/2022 Prepared by: Edwinna Areola   Exercises - Long Sitting 4 Way Patellar Glide  - 2 x daily - 7 x weekly - 2-3 sets - 60" hold - Supine Quadricep Sets  - 2 x daily - 7 x weekly - 2 sets - 10 reps - 5 sec hold - Supine Heel Slide  - 2 x daily - 7 x weekly - 2 sets - 10 reps - 5 sec hold - Seated Long Arc Quad  - 2 x daily - 7 x weekly - 2 sets - 10 reps   ASSESSMENT:   CLINICAL IMPRESSION:  Patient presents to PT reporting no current pain and states that her pain has been getting to a 5/10 at the worst recently, in the Rt knee. She is expecting to be released from light duty at work soon. Session today focused on quad and proximal hip strengthening with increased difficulty today to moderate effect, she reports increase in muscle fatigue with exercise, but no increase in pain. Patient continues to benefit from skilled PT services and should be progressed as able to improve functional independence.    OBJECTIVE IMPAIRMENTS: decreased activity tolerance, decreased mobility, difficulty walking, decreased ROM, decreased strength, and pain.    ACTIVITY LIMITATIONS: sitting, standing, squatting, stairs, transfers, and locomotion level   PARTICIPATION LIMITATIONS: driving, shopping, community activity, and yard work   PERSONAL FACTORS: Fitness and 1 comorbidity: HTN  are also affecting patient's functional outcome.      GOALS: Goals reviewed with patient? No   SHORT TERM GOALS: Target date: 10/28/2022   Pt will be compliant and knowledgeable with initial HEP for improved comfort and carryover Baseline: initial HEP given  Goal status: MET Pt reports adherence 11/25/22   2.  Pt will self report bilateral knee pain no  greater than 6/10 for improved comfort and functional ability Baseline: 8/10 at worst Goal status: MET Pt reports 5/10 at highest 11/25/22   LONG TERM GOALS: Target date: 12/02/2022   Pt will improve FOTO function score to no less than 58% as proxy for functional improvement Baseline: 29% function Goal status: INITIAL    2.  Pt will self report bilateral knee pain no greater  than 3/10 for improved comfort and functional ability Baseline: 8/10 at worst Goal status: INITIAL    3.  Pt will increase 30 Second Sit to Stand rep count to no less than 8 reps for improved balance, strength, and functional mobility Baseline: 5 reps  Goal status: INITIAL    4.  Pt will improve R knee AROM to no less than 0-120 for improved comfort and functional mobility Baseline: 11-95 Goal status: INITIAL   5.  Pt will improve bilateral knee MMT to no less than 4/5 for all tested motions for improved comfort and function Baseline: see chart Goal status: INITIAL     PLAN:   PT FREQUENCY: 2x/week   PT DURATION: 8 weeks   PLANNED INTERVENTIONS: Therapeutic exercises, Therapeutic activity, Neuromuscular re-education, Balance training, Gait training, Patient/Family education, Self Care, Joint mobilization, Aquatic Therapy, Dry Needling, Electrical stimulation, Cryotherapy, Moist heat, Vasopneumatic device, Manual therapy, and Re-evaluation   PLAN FOR NEXT SESSION: assess HEP response, progress proximal hip and quad strength   Berta Minor, PTA 11/25/2022, 6:24 PM

## 2022-11-26 ENCOUNTER — Ambulatory Visit: Payer: Commercial Managed Care - PPO | Attending: Physician Assistant | Admitting: Physician Assistant

## 2022-11-26 ENCOUNTER — Encounter: Payer: Self-pay | Admitting: Physician Assistant

## 2022-11-26 VITALS — BP 107/73 | HR 82 | Temp 98.5°F | Ht 62.0 in

## 2022-11-26 DIAGNOSIS — M7651 Patellar tendinitis, right knee: Secondary | ICD-10-CM | POA: Diagnosis not present

## 2022-11-26 DIAGNOSIS — J3089 Other allergic rhinitis: Secondary | ICD-10-CM | POA: Diagnosis not present

## 2022-11-26 MED ORDER — FLUTICASONE PROPIONATE 50 MCG/ACT NA SUSP: 2.0000 | Freq: Every day | NASAL | 6 refills | Status: DC

## 2022-11-26 NOTE — Progress Notes (Signed)
Patient ID: [name], female   DOB: Aug 17, 1986, 37 y.o.   MRN: [id number]   [name], is a 37 y.o. female  [id number]  [id number]  DOB - [birth year]  Chief Complaint  Patient presents with   Letter for School/Work    Requesting letter to return to work next week starting Monday 12/01/22 - Last day of PT is 12/04/2022.  Med refill.        Subjective:   [name] is a 37 y.o. female here today for a return to full duty note as of April 8th, 2024.  She says she has been working with restrictions and has a couple of more visits with PT.  Knee is better.  She says Dr Joya Gaskins said he would fill out FMLA papers incase the injury causes future problems.  I would have thought ortho would have put her back to full duty as I wrote a light duty note as of 09/09/2022 to suffice until she was seen by ortho.    Also having sinus congestion and allergies and needs RF flonase  No problems updated.  ALLERGIES: No Known Allergies  PAST MEDICAL HISTORY: Past Medical History:  Diagnosis Date   Anemia    Chronic hypertension during pregnancy, antepartum 12/24/2017   [x]  Aspirin 81 mg daily after 12 weeks; discontinue after 36 weeks Current antihypertensives:  None   Baseline and surveillance labs (pulled in from [facility], refresh links as needed)  Lab Results Component Value Date  PLT 353 11/25/2016  CREATININE 0.50 11/24/2017  AST 24 11/25/2016  ALT 17 11/25/2016  PROTCRRATIO 0.30 (H) 12/31/2015   Antenatal Testing CHTN - O10.919  Group I  BP < 140/90, no preecl   Hypertension    Pregnancy induced hypertension     MEDICATIONS AT HOME: Prior to Admission medications   Medication Sig Start Date End Date Taking? Authorizing Provider  diclofenac Sodium (VOLTAREN) 1 % GEL Apply 2 g topically 4 (four) times daily. 09/30/22  Yes Elsie Stain, MD  ferrous gluconate (FERGON) 324 MG tablet Take 1 tablet (324 mg total) by mouth 2 (two) times daily with a meal. 05/16/22  Yes Elsie Stain, MD   lisinopril-hydrochlorothiazide (ZESTORETIC) 20-12.5 MG tablet Take 1 tablet by mouth daily. 09/30/22  Yes Elsie Stain, MD  metroNIDAZOLE (FLAGYL) 500 MG tablet Take 1 tablet (500 mg total) by mouth 2 (two) times daily. 10/29/22  Yes Marny Lowenstein A, NP  naproxen (NAPROSYN) 500 MG tablet TAKE 1 TABLET(500 MG) BY MOUTH TWICE DAILY AS NEEDED FOR PAIN 10/13/22  Yes Elsie Stain, MD  fluticasone [facility]) 50 MCG/ACT nasal spray Place 2 sprays into both nostrils daily. 11/26/22   Damesha Lawler, Dionne Bucy, PA-C    ROS: Neg HEENT Neg resp Neg cardiac Neg GI Neg GU Neg MS Neg psych Neg neuro  Objective:   Vitals:   11/26/22 1016  BP: 107/73  Pulse: 82  Temp: 98.5 F (36.9 C)  TempSrc: Oral  SpO2: 98%  Height: 5\' 2"  (1.575 m)   Exam General appearance : Awake, alert, not in any distress. Speech Clear. Not toxic looking HEENT: Atraumatic and Normocephalic Neck: Supple, no JVD. No cervical lymphadenopathy.  Chest: Good air entry bilaterally, CTAB.  No rales/rhonchi/wheezing CVS: S1 S2 regular, no murmurs.  Knee is stable without TTP along patella tendon Extremities: B/L Lower Ext shows no edema, both legs are warm to touch Neurology: Awake alert, and oriented X 3, CN II-XII intact, Non focal Skin: No Rash  Data  Review Lab Results  Component Value Date   HGBA1C 4.7 (L) 06/24/2021    Assessment & Plan   1. Allergic rhinitis due to other allergic trigger, unspecified seasonality - fluticasone (FLONASE) 50 MCG/ACT nasal spray; Place 2 sprays into both nostrils daily.  Dispense: 16 g; Refill: 6  2. Patellar tendinitis of right knee Seen by ortho and has been having PT.  I will write her a note saying she no longer has to be on light duty and may return to full duty 12/01/2022.  She will leave FMLA papers for Dr Joya Gaskins if he deems appropriate for future visits/anticipates her being out of work in the future for this.  However, ortho seemed to believe the pain was  multifaceted/arthritis/prominence with Osgood Schlatter's, but the pain has resolved at this point.      Return if symptoms worsen or fail to improve.  The patient was given clear instructions to go to ER or return to medical center if symptoms don't improve, worsen or new problems develop. The patient verbalized understanding. The patient was told to call to get lab results if they haven't heard anything in the next week.      Freeman Caldron, PA-C [facility] and [facility], [place]   11/26/2022, 10:40 AM

## 2022-11-27 ENCOUNTER — Ambulatory Visit: Payer: Commercial Managed Care - PPO

## 2022-12-03 ENCOUNTER — Telehealth: Payer: Self-pay

## 2022-12-03 NOTE — Telephone Encounter (Signed)
Called to Schedule  appointment with [employer] to fill out FMLA paperwork

## 2022-12-04 ENCOUNTER — Ambulatory Visit: Payer: Commercial Managed Care - PPO

## 2023-01-01 ENCOUNTER — Ambulatory Visit: Payer: Commercial Managed Care - PPO | Attending: Critical Care Medicine | Admitting: Critical Care Medicine

## 2023-01-01 ENCOUNTER — Encounter: Payer: Self-pay | Admitting: Critical Care Medicine

## 2023-01-01 VITALS — BP 106/71 | HR 80 | Ht 62.0 in | Wt 212.0 lb

## 2023-01-01 DIAGNOSIS — M7651 Patellar tendinitis, right knee: Secondary | ICD-10-CM

## 2023-01-01 DIAGNOSIS — Z029 Encounter for administrative examinations, unspecified: Secondary | ICD-10-CM | POA: Diagnosis not present

## 2023-01-01 DIAGNOSIS — B3731 Acute candidiasis of vulva and vagina: Secondary | ICD-10-CM

## 2023-01-01 DIAGNOSIS — J3089 Other allergic rhinitis: Secondary | ICD-10-CM | POA: Diagnosis not present

## 2023-01-01 DIAGNOSIS — I10 Essential (primary) hypertension: Secondary | ICD-10-CM | POA: Diagnosis not present

## 2023-01-01 MED ORDER — FLUTICASONE PROPIONATE 50 MCG/ACT NA SUSP: 2.0000 | Freq: Every day | NASAL | 6 refills | Status: DC

## 2023-01-01 MED ORDER — NYSTATIN 100000 UNIT/GM EX POWD: 1.0000 | Freq: Three times a day (TID) | CUTANEOUS | 0 refills | Status: DC

## 2023-01-01 NOTE — Assessment & Plan Note (Signed)
Blood pressure controlled no change in medication 

## 2023-01-01 NOTE — Assessment & Plan Note (Signed)
Would like refills on topical antifungal

## 2023-01-01 NOTE — Patient Instructions (Signed)
FMLA paperwork completed  Trial lidocaine patch to your left knee at night this is over-the-counter  Flonase prescription resent  Return to Dr. Delford Field 4 months

## 2023-01-01 NOTE — Progress Notes (Signed)
New Patient Office Visit  Subjective:  Patient ID: [name], female    DOB: 1986-02-02  Age: 37 y.o. MRN: [id number]  CC:  Chief Complaint  Patient presents with   Paperwork    Needing FLMA paperwork filled out.     HPI 06/24/21 [name] presents for for primary care to establish.  Patient was seen in mobile medicine because of an eye infection given topical eyedrops previously.  This is resolved this issue.  She continues to have difficulty with a rash in the groin.  She is due a mammogram and Pap smear and needs support for this.  Patient does have increased weight gain  Patient has hypertension does need refills on the lisinopril HCT and iron supplements  9/19 Patient return and on arrival blood pressure is 112/75 and she is maintaining her lisinopril HCT.  She complains of progressive rash in both groin areas which responds to nystatin powder she would like a refill.  Patient also notes that she is having progressive recurrent vaginal discharge.  She was treated in May for this and improved and then worsened.  She also needs her second of 3 HPV vaccines at this visit  09/30/22 Patient seen and follow-up up from last visit in September.  She fell at work injuring her right knee.  She was seen by our PA who then referred her to orthopedics.  No fractures on x-ray.  Orthopedics felt this patient had tendinitis of the patellar tendons.  Patient was given topical Voltaren gel but she is yet to pick this up.  She does have a brace on the right knee.  She needs a letter regarding work so she can do light duty.  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  On arrival blood pressure is good 113/79 and she maintains Zestoretic 20/12-1/2.  She does need follow-up renal panel.  Patient does not tolerate iron tablets well needs follow-up on iron studies.  She was given Naprosyn but she has not really tried that for the pain.  Patient continues with the vaginal discharge and would like a gynecology  referral.  The patient is sexually active and has had tubal ligation    The pregnancy intention screening data noted above was reviewed. Potential methods of contraception were discussed. The patient elected to proceed with No data recorded.  01/01/2023 This patient returns today for FMLA paperwork she had fallen at work on 13 January and required to be out of work between 16 January through December 01, 2022.  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  She works a 40-hour week.  She states she is improving at this time.  She has received physical therapy and also takes anti-inflammatories.  There are no other complaints.  She does need refills on her nasal spray.  .   Past Medical History:  Diagnosis Date   Anemia    Chronic hypertension during pregnancy, antepartum 12/24/2017   [x]  Aspirin 81 mg daily after 12 weeks; discontinue after 36 weeks Current antihypertensives:  None   Baseline and surveillance labs (pulled in from [facility], refresh links as needed)  Lab Results Component Value Date  PLT 353 11/25/2016  CREATININE 0.50 11/24/2017  AST 24 11/25/2016  ALT 17 11/25/2016  PROTCRRATIO 0.30 (H) 12/31/2015   Antenatal Testing CHTN - O10.919  Group I  BP < 140/90, no preecl   Hypertension    Pregnancy induced hypertension     Past  Surgical History:  Procedure Laterality Date   TUBAL LIGATION Bilateral 07/20/2018   Procedure: POST PARTUM TUBAL LIGATION;  Surgeon: Levie Heritage, DO;  Location: [facility];  Service: Gynecology;  Laterality: Bilateral;    Family History  Problem Relation Age of Onset   Stroke Mother    Hypertension Mother    Hypertension Father     Social History   Socioeconomic History   Marital status: Single    Spouse name: Not on file   Number of children: Not on file   Years of education: Not on file   Highest education level: Not on file  Occupational History   Not on file  Tobacco Use   Smoking status: Former    Types:  Cigarettes    Quit date: 03/29/2013    Years since quitting: 9.7   Smokeless tobacco: Never  Vaping Use   Vaping Use: Never used  Substance and Sexual Activity   Alcohol use: Yes    Comment: occ   Drug use: No   Sexual activity: Yes    Birth control/protection: Condom  Other Topics Concern   Not on file  Social History Narrative   Not on file   Social Determinants of Health   Financial Resource Strain: Not on file  Food Insecurity: No Food Insecurity (08/31/2018)   Hunger Vital Sign    Worried About Running Out of Food in the Last Year: Never true    Ran Out of Food in the Last Year: Never true  Transportation Needs: No Transportation Needs (08/31/2018)   PRAPARE - Administrator, Civil Service (Medical): No    Lack of Transportation (Non-Medical): No  Physical Activity: Not on file  Stress: Not on file  Social Connections: Not on file  Intimate Partner Violence: Not on file    ROS Review of Systems  Constitutional: Negative.   HENT: Negative.  Negative for ear pain, postnasal drip, rhinorrhea, sinus pressure, sore throat, trouble swallowing and voice change.   Eyes: Negative.   Respiratory: Negative.  Negative for apnea, cough, choking, chest tightness, shortness of breath, wheezing and stridor.   Cardiovascular: Negative.  Negative for chest pain, palpitations and leg swelling.  Gastrointestinal: Negative.  Negative for abdominal distention, abdominal pain, nausea and vomiting.  Genitourinary:  Positive for vaginal discharge.  Musculoskeletal:  Negative for arthralgias and myalgias.       Right knee pain patellar tendon  Skin:  Positive for rash.  Allergic/Immunologic: Negative.  Negative for environmental allergies and food allergies.  Neurological: Negative.  Negative for dizziness, syncope, weakness and headaches.  Hematological: Negative.  Negative for adenopathy. Does not bruise/bleed easily.  Psychiatric/Behavioral: Negative.  Negative for agitation and  sleep disturbance. The patient is not nervous/anxious.     Objective:   Today's Vitals: BP 106/71 (BP Location: Left Arm, Patient Position: Sitting, Cuff Size: Large)   Pulse 80   Ht 5\' 2"  (1.575 m)   Wt 212 lb (96.2 kg)   LMP 12/11/2022 (Approximate)   SpO2 97%   BMI 38.78 kg/m   Physical Exam Vitals reviewed.  Constitutional:      Appearance: Normal appearance. She is well-developed. She is obese. She is not diaphoretic.  HENT:     Head: Normocephalic and atraumatic.     Nose: No nasal deformity, septal deviation, mucosal edema or rhinorrhea.     Right Sinus: No maxillary sinus tenderness or frontal sinus tenderness.     Left Sinus: No maxillary sinus tenderness or  frontal sinus tenderness.     Mouth/Throat:     Pharynx: No oropharyngeal exudate.  Eyes:     General: No scleral icterus.    Conjunctiva/sclera: Conjunctivae normal.     Pupils: Pupils are equal, round, and reactive to light.  Neck:     Thyroid: No thyromegaly.     Vascular: No carotid bruit or JVD.     Trachea: Trachea normal. No tracheal tenderness or tracheal deviation.  Cardiovascular:     Rate and Rhythm: Normal rate and regular rhythm.     Chest Wall: PMI is not displaced.     Pulses: Normal pulses. No decreased pulses.     Heart sounds: Normal heart sounds, S1 normal and S2 normal. Heart sounds not distant. No murmur heard.    No systolic murmur is present.     No diastolic murmur is present.     No friction rub. No gallop. No S3 or S4 sounds.  Pulmonary:     Effort: No tachypnea, accessory muscle usage or respiratory distress.     Breath sounds: No stridor. No decreased breath sounds, wheezing, rhonchi or rales.  Chest:     Chest wall: No tenderness.  Abdominal:     General: Bowel sounds are normal. There is no distension.     Palpations: Abdomen is soft. Abdomen is not rigid.     Tenderness: There is no abdominal tenderness. There is no guarding or rebound.  Genitourinary:    Comments: Groin  rash compatible with Candida Musculoskeletal:        General: Tenderness present. Normal range of motion.     Cervical back: Normal range of motion and neck supple. No edema, erythema or rigidity. No muscular tenderness. Normal range of motion.     Comments: Tenderness over the right patellar tendon at insertion to the tibia  Lymphadenopathy:     Head:     Right side of head: No submental or submandibular adenopathy.     Left side of head: No submental or submandibular adenopathy.     Cervical: No cervical adenopathy.  Skin:    General: Skin is warm and dry.     Coloration: Skin is not pale.     Findings: No erythema or rash.     Nails: There is no clubbing.     Comments: Bilateral groin rash compatible with Candida  Neurological:     Mental Status: She is alert and oriented to person, place, and time.     Sensory: No sensory deficit.  Psychiatric:        Speech: Speech normal.        Behavior: Behavior normal.     Assessment & Plan:   Problem List Items Addressed This Visit       Cardiovascular and Mediastinum   Hypertension    Blood pressure controlled no change in medication        Musculoskeletal and Integument   Patellar tendinitis of right knee    Improved with therapy FMLA paperwork performed        Genitourinary   Vagina, candidiasis    Would like refills on topical antifungal      Relevant Medications   nystatin (MYCOSTATIN/NYSTOP) powder     Other   Administrative encounter - Primary    FMLA paperwork performed will fax and gave patient a copy      Other Visit Diagnoses     Allergic rhinitis due to other allergic trigger, unspecified seasonality  Relevant Medications   fluticasone (FLONASE) 50 MCG/ACT nasal spray      Outpatient Encounter Medications as of 01/01/2023  Medication Sig   diclofenac Sodium (VOLTAREN) 1 % GEL Apply 2 g topically 4 (four) times daily.   ferrous gluconate (FERGON) 324 MG tablet Take 1 tablet (324 mg total) by  mouth 2 (two) times daily with a meal. (Patient taking differently: Take 324 mg by mouth daily as needed.)   lisinopril-hydrochlorothiazide (ZESTORETIC) 20-12.5 MG tablet Take 1 tablet by mouth daily.   naproxen (NAPROSYN) 500 MG tablet TAKE 1 TABLET(500 MG) BY MOUTH TWICE DAILY AS NEEDED FOR PAIN   fluticasone (FLONASE) 50 MCG/ACT nasal spray Place 2 sprays into both nostrils daily.   nystatin (MYCOSTATIN/NYSTOP) powder Apply 1 Application topically 3 (three) times daily.   [DISCONTINUED] fluticasone (FLONASE) 50 MCG/ACT nasal spray Place 2 sprays into both nostrils daily. (Patient not taking: Reported on 01/01/2023)   [DISCONTINUED] metroNIDAZOLE (FLAGYL) 500 MG tablet Take 1 tablet (500 mg total) by mouth 2 (two) times daily. (Patient not taking: Reported on 01/01/2023)   [DISCONTINUED] nystatin (MYCOSTATIN/NYSTOP) powder Apply 1 Application topically 3 (three) times daily.   No facility-administered encounter medications on file as of 01/01/2023.  35 minutes spent extra time needed Follow-up: Return in about 4 months (around 05/04/2023) for htn.   Shan Levans, MD

## 2023-01-01 NOTE — Assessment & Plan Note (Signed)
FMLA paperwork performed will fax and gave patient a copy

## 2023-01-01 NOTE — Assessment & Plan Note (Signed)
Improved with therapy FMLA paperwork performed

## 2023-01-09 ENCOUNTER — Telehealth: Payer: Self-pay | Admitting: Critical Care Medicine

## 2023-01-09 NOTE — Telephone Encounter (Signed)
Copied from CRM [id number]. Topic: General - Other >> Jan 09, 2023 10:13 AM Turkey B wrote: Reason for CRM: pt called in says needs page 6 of fmla papers faxed over to [facility].

## 2023-01-12 NOTE — Telephone Encounter (Signed)
I had filled all paper work out  likely not faxed  pull documents and refax

## 2023-01-12 NOTE — Telephone Encounter (Signed)
Noted will do once back in office tomorrow

## 2023-03-26 ENCOUNTER — Ambulatory Visit: Payer: Commercial Managed Care - PPO | Admitting: Critical Care Medicine

## 2023-03-26 NOTE — Progress Notes (Deleted)
New Patient Office Visit  Subjective:  Patient ID: [name], female    DOB: May 17, 1986  Age: 37 y.o. MRN: [id number]  CC:  No chief complaint on file.   HPI 06/24/21 [name] presents for for primary care to establish.  Patient was seen in mobile medicine because of an eye infection given topical eyedrops previously.  This is resolved this issue.  She continues to have difficulty with a rash in the groin.  She is due a mammogram and Pap smear and needs support for this.  Patient does have increased weight gain  Patient has hypertension does need refills on the lisinopril HCT and iron supplements  9/19 Patient return and on arrival blood pressure is 112/75 and she is maintaining her lisinopril HCT.  She complains of progressive rash in both groin areas which responds to nystatin powder she would like a refill.  Patient also notes that she is having progressive recurrent vaginal discharge.  She was treated in May for this and improved and then worsened.  She also needs her second of 3 HPV vaccines at this visit  09/30/22 Patient seen and follow-up up from last visit in September.  She fell at work injuring her right knee.  She was seen by our PA who then referred her to orthopedics.  No fractures on x-ray.  Orthopedics felt this patient had tendinitis of the patellar tendons.  Patient was given topical Voltaren gel but she is yet to pick this up.  She does have a brace on the right knee.  She needs a letter regarding work so she can do light duty.  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  On arrival blood pressure is good 113/79 and she maintains Zestoretic 20/12-1/2.  She does need follow-up renal panel.  Patient does not tolerate iron tablets well needs follow-up on iron studies.  She was given Naprosyn but she has not really tried that for the pain.  Patient continues with the vaginal discharge and would like a gynecology referral.  The patient is sexually active and has had tubal  ligation    The pregnancy intention screening data noted above was reviewed. Potential methods of contraception were discussed. The patient elected to proceed with No data recorded.  01/01/2023 This patient returns today for FMLA paperwork she had fallen at work on 13 January and required to be out of work between 16 January through December 01, 2022.  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  She works a 40-hour week.  She states she is improving at this time.  She has received physical therapy and also takes anti-inflammatories.  There are no other complaints.  She does need refills on her nasal spray.  8/1  Past Medical History:  Diagnosis Date   Anemia    Chronic hypertension during pregnancy, antepartum 12/24/2017   [x]  Aspirin 81 mg daily after 12 weeks; discontinue after 36 weeks Current antihypertensives:  None   Baseline and surveillance labs (pulled in from [facility], refresh links as needed)  Lab Results Component Value Date  PLT 353 11/25/2016  CREATININE 0.50 11/24/2017  AST 24 11/25/2016  ALT 17 11/25/2016  PROTCRRATIO 0.30 (H) 12/31/2015   Antenatal Testing CHTN - O10.919  Group I  BP < 140/90, no preecl   Hypertension    Pregnancy induced hypertension     Past Surgical History:  Procedure Laterality Date   TUBAL LIGATION Bilateral 07/20/2018   Procedure:  POST PARTUM TUBAL LIGATION;  Surgeon: Levie Heritage, DO;  Location: [facility];  Service: Gynecology;  Laterality: Bilateral;    Family History  Problem Relation Age of Onset   Stroke Mother    Hypertension Mother    Hypertension Father     Social History   Socioeconomic History   Marital status: Single    Spouse name: Not on file   Number of children: Not on file   Years of education: Not on file   Highest education level: Not on file  Occupational History   Not on file  Tobacco Use   Smoking status: Former    Current packs/day: 0.00    Types: Cigarettes    Quit date:  03/29/2013    Years since quitting: 9.9   Smokeless tobacco: Never  Vaping Use   Vaping status: Never Used  Substance and Sexual Activity   Alcohol use: Yes    Comment: occ   Drug use: No   Sexual activity: Yes    Birth control/protection: Condom  Other Topics Concern   Not on file  Social History Narrative   Not on file   Social Determinants of Health   Financial Resource Strain: Not on file  Food Insecurity: No Food Insecurity (08/31/2018)   Hunger Vital Sign    Worried About Running Out of Food in the Last Year: Never true    Ran Out of Food in the Last Year: Never true  Transportation Needs: No Transportation Needs (08/31/2018)   PRAPARE - Administrator, Civil Service (Medical): No    Lack of Transportation (Non-Medical): No  Physical Activity: Not on file  Stress: Not on file  Social Connections: Not on file  Intimate Partner Violence: Not on file    ROS Review of Systems  Constitutional: Negative.   HENT: Negative.  Negative for ear pain, postnasal drip, rhinorrhea, sinus pressure, sore throat, trouble swallowing and voice change.   Eyes: Negative.   Respiratory: Negative.  Negative for apnea, cough, choking, chest tightness, shortness of breath, wheezing and stridor.   Cardiovascular: Negative.  Negative for chest pain, palpitations and leg swelling.  Gastrointestinal: Negative.  Negative for abdominal distention, abdominal pain, nausea and vomiting.  Genitourinary:  Positive for vaginal discharge.  Musculoskeletal:  Negative for arthralgias and myalgias.       Right knee pain patellar tendon  Skin:  Positive for rash.  Allergic/Immunologic: Negative.  Negative for environmental allergies and food allergies.  Neurological: Negative.  Negative for dizziness, syncope, weakness and headaches.  Hematological: Negative.  Negative for adenopathy. Does not bruise/bleed easily.  Psychiatric/Behavioral: Negative.  Negative for agitation and sleep disturbance. The  patient is not nervous/anxious.     Objective:   Today's Vitals: There were no vitals taken for this visit.  Physical Exam Vitals reviewed.  Constitutional:      Appearance: Normal appearance. She is well-developed. She is obese. She is not diaphoretic.  HENT:     Head: Normocephalic and atraumatic.     Nose: No nasal deformity, septal deviation, mucosal edema or rhinorrhea.     Right Sinus: No maxillary sinus tenderness or frontal sinus tenderness.     Left Sinus: No maxillary sinus tenderness or frontal sinus tenderness.     Mouth/Throat:     Pharynx: No oropharyngeal exudate.  Eyes:     General: No scleral icterus.    Conjunctiva/sclera: Conjunctivae normal.     Pupils: Pupils are equal, round, and reactive to light.  Neck:     Thyroid: No thyromegaly.     Vascular: No carotid bruit or JVD.     Trachea: Trachea normal. No tracheal tenderness or tracheal deviation.  Cardiovascular:     Rate and Rhythm: Normal rate and regular rhythm.     Chest Wall: PMI is not displaced.     Pulses: Normal pulses. No decreased pulses.     Heart sounds: Normal heart sounds, S1 normal and S2 normal. Heart sounds not distant. No murmur heard.    No systolic murmur is present.     No diastolic murmur is present.     No friction rub. No gallop. No S3 or S4 sounds.  Pulmonary:     Effort: No tachypnea, accessory muscle usage or respiratory distress.     Breath sounds: No stridor. No decreased breath sounds, wheezing, rhonchi or rales.  Chest:     Chest wall: No tenderness.  Abdominal:     General: Bowel sounds are normal. There is no distension.     Palpations: Abdomen is soft. Abdomen is not rigid.     Tenderness: There is no abdominal tenderness. There is no guarding or rebound.  Genitourinary:    Comments: Groin rash compatible with Candida Musculoskeletal:        General: Tenderness present. Normal range of motion.     Cervical back: Normal range of motion and neck supple. No edema,  erythema or rigidity. No muscular tenderness. Normal range of motion.     Comments: Tenderness over the right patellar tendon at insertion to the tibia  Lymphadenopathy:     Head:     Right side of head: No submental or submandibular adenopathy.     Left side of head: No submental or submandibular adenopathy.     Cervical: No cervical adenopathy.  Skin:    General: Skin is warm and dry.     Coloration: Skin is not pale.     Findings: No erythema or rash.     Nails: There is no clubbing.     Comments: Bilateral groin rash compatible with Candida  Neurological:     Mental Status: She is alert and oriented to person, place, and time.     Sensory: No sensory deficit.  Psychiatric:        Speech: Speech normal.        Behavior: Behavior normal.     Assessment & Plan:   Problem List Items Addressed This Visit   None   Outpatient Encounter Medications as of 03/26/2023  Medication Sig   diclofenac Sodium (VOLTAREN) 1 % GEL Apply 2 g topically 4 (four) times daily.   ferrous gluconate (FERGON) 324 MG tablet Take 1 tablet (324 mg total) by mouth 2 (two) times daily with a meal. (Patient taking differently: Take 324 mg by mouth daily as needed.)   fluticasone (FLONASE) 50 MCG/ACT nasal spray Place 2 sprays into both nostrils daily.   lisinopril-hydrochlorothiazide (ZESTORETIC) 20-12.5 MG tablet Take 1 tablet by mouth daily.   naproxen (NAPROSYN) 500 MG tablet TAKE 1 TABLET(500 MG) BY MOUTH TWICE DAILY AS NEEDED FOR PAIN   nystatin (MYCOSTATIN/NYSTOP) powder Apply 1 Application topically 3 (three) times daily.   No facility-administered encounter medications on file as of 03/26/2023.  35 minutes spent extra time needed Follow-up: No follow-ups on file.   Shan Levans, MD

## 2023-03-27 ENCOUNTER — Encounter (HOSPITAL_BASED_OUTPATIENT_CLINIC_OR_DEPARTMENT_OTHER): Payer: Self-pay

## 2023-03-27 ENCOUNTER — Emergency Department (HOSPITAL_BASED_OUTPATIENT_CLINIC_OR_DEPARTMENT_OTHER)
Admission: EM | Admit: 2023-03-27 | Discharge: 2023-03-27 | Disposition: A | Discharge status: Home / Self Care | Payer: Commercial Managed Care - PPO | Attending: Emergency Medicine | Admitting: Emergency Medicine

## 2023-03-27 ENCOUNTER — Emergency Department (HOSPITAL_BASED_OUTPATIENT_CLINIC_OR_DEPARTMENT_OTHER): Payer: Commercial Managed Care - PPO

## 2023-03-27 ENCOUNTER — Ambulatory Visit: Payer: Self-pay

## 2023-03-27 ENCOUNTER — Emergency Department (HOSPITAL_BASED_OUTPATIENT_CLINIC_OR_DEPARTMENT_OTHER): Payer: Commercial Managed Care - PPO | Admitting: Radiology

## 2023-03-27 ENCOUNTER — Other Ambulatory Visit: Payer: Self-pay

## 2023-03-27 DIAGNOSIS — M79604 Pain in right leg: Secondary | ICD-10-CM

## 2023-03-27 DIAGNOSIS — M79661 Pain in right lower leg: Secondary | ICD-10-CM | POA: Diagnosis not present

## 2023-03-27 DIAGNOSIS — R600 Localized edema: Secondary | ICD-10-CM | POA: Diagnosis not present

## 2023-03-27 DIAGNOSIS — I10 Essential (primary) hypertension: Secondary | ICD-10-CM | POA: Diagnosis not present

## 2023-03-27 DIAGNOSIS — M79605 Pain in left leg: Secondary | ICD-10-CM | POA: Diagnosis not present

## 2023-03-27 DIAGNOSIS — Z79899 Other long term (current) drug therapy: Secondary | ICD-10-CM | POA: Diagnosis not present

## 2023-03-27 DIAGNOSIS — M79671 Pain in right foot: Secondary | ICD-10-CM | POA: Diagnosis not present

## 2023-03-27 MED ORDER — ACETAMINOPHEN 325 MG PO TABS
650.0000 mg | ORAL_TABLET | Freq: Once | ORAL | Status: AC
  Administered 2023-03-27: 650 mg via ORAL
  Filled 2023-03-27: qty 2

## 2023-03-27 NOTE — Telephone Encounter (Signed)
Pt called from ED. She is there currently. Pt is unsure that if they are looking for a blood clot in her leg. Pt will wait to speak to provider and ask further questions regarding testing for  DVT if needed.

## 2023-03-27 NOTE — ED Provider Notes (Signed)
[place] EMERGENCY DEPARTMENT AT [facility] Provider Note   CSN: [id number] Arrival date & time: 03/27/23  1449     History  Chief Complaint  Patient presents with   Foot Pain    [name] is a 37 y.o. female with a past medical history significant for hypertension who presents to the ED due to bilateral lower extremity edema.  Patient sent by PCP to rule out DVT.  Denies chest pain and shortness of breath.  Patient states she has been having chronic left foot pain and edema for the past month.  She also admits to right lower extremity pain.  No history of blood clots, recent surgeries, recent long immobilizations, or hormonal treatments.  No injury to lower extremities.  Denies erythema.  History obtained from patient and past medical records. No interpreter used during encounter.       Home Medications Prior to Admission medications   Medication Sig Start Date End Date Taking? Authorizing Provider  diclofenac Sodium (VOLTAREN) 1 % GEL Apply 2 g topically 4 (four) times daily. 09/30/22   Storm Frisk, MD  ferrous gluconate (FERGON) 324 MG tablet Take 1 tablet (324 mg total) by mouth 2 (two) times daily with a meal. Patient taking differently: Take 324 mg by mouth daily as needed. 05/16/22   Storm Frisk, MD  fluticasone (FLONASE) 50 MCG/ACT nasal spray Place 2 sprays into both nostrils daily. 01/01/23   Storm Frisk, MD  lisinopril-hydrochlorothiazide (ZESTORETIC) 20-12.5 MG tablet Take 1 tablet by mouth daily. 09/30/22   Storm Frisk, MD  naproxen (NAPROSYN) 500 MG tablet TAKE 1 TABLET(500 MG) BY MOUTH TWICE DAILY AS NEEDED FOR PAIN 10/13/22   Storm Frisk, MD  nystatin (MYCOSTATIN/NYSTOP) powder Apply 1 Application topically 3 (three) times daily. 01/01/23   Storm Frisk, MD      Allergies    Patient has no known allergies.    Review of Systems   Review of Systems  Constitutional:  Negative for fever.  Respiratory:  Negative for shortness  of breath.   Cardiovascular:  Positive for leg swelling. Negative for chest pain.    Physical Exam Updated Vital Signs BP 117/69 (BP Location: Right Arm)   Pulse 99   Temp 98.2 F (36.8 C)   Resp 18   Ht 5\' 2"  (1.575 m)   Wt 97.5 kg   LMP 03/17/2023   SpO2 99%   BMI 39.32 kg/m  Physical Exam Vitals and nursing note reviewed.  Constitutional:      General: She is not in acute distress.    Appearance: She is not ill-appearing.  HENT:     Head: Normocephalic.  Eyes:     Pupils: Pupils are equal, round, and reactive to light.  Cardiovascular:     Rate and Rhythm: Normal rate and regular rhythm.     Pulses: Normal pulses.     Heart sounds: Normal heart sounds. No murmur heard.    No friction rub. No gallop.  Pulmonary:     Effort: Pulmonary effort is normal.     Breath sounds: Normal breath sounds.  Abdominal:     General: Abdomen is flat. There is no distension.     Palpations: Abdomen is soft.     Tenderness: There is no abdominal tenderness. There is no guarding or rebound.  Musculoskeletal:        General: Normal range of motion.     Cervical back: Neck supple.     Comments:  Trace edema to bilateral ankles.  No pitting edema.  Mild right calf tenderness.  Skin:    General: Skin is warm and dry.  Neurological:     General: No focal deficit present.     Mental Status: She is alert.  Psychiatric:        Mood and Affect: Mood normal.        Behavior: Behavior normal.     ED Results / Procedures / Treatments   Labs (all labs ordered are listed, but only abnormal results are displayed) Labs Reviewed - No data to display  EKG None  Radiology US Venous Img Lower Bilateral  Result Date: 03/27/2023 CLINICAL DATA:  edema EXAM: BILATERAL LOWER EXTREMITY VENOUS DOPPLER ULTRASOUND TECHNIQUE: Gray-scale sonography with graded compression, as well as color Doppler and duplex ultrasound were performed to evaluate the lower extremity deep venous systems from the level of  the common femoral vein and including the common femoral, femoral, profunda femoral, popliteal and calf veins including the posterior tibial, peroneal and gastrocnemius veins when visible. The superficial great saphenous vein was also interrogated. Spectral Doppler was utilized to evaluate flow at rest and with distal augmentation maneuvers in the common femoral, femoral and popliteal veins. COMPARISON:  None Available. FINDINGS: RIGHT LOWER EXTREMITY Common Femoral Vein: No evidence of thrombus. Normal compressibility, respiratory phasicity and response to augmentation. Saphenofemoral Junction: No evidence of thrombus. Normal compressibility and flow on color Doppler imaging. Profunda Femoral Vein: No evidence of thrombus. Normal compressibility and flow on color Doppler imaging. Femoral Vein: No evidence of thrombus. Normal compressibility, respiratory phasicity and response to augmentation. Popliteal Vein: No evidence of thrombus. Normal compressibility, respiratory phasicity and response to augmentation. Calf Veins: No evidence of thrombus. Normal compressibility and flow on color Doppler imaging. LEFT LOWER EXTREMITY Common Femoral Vein: No evidence of thrombus. Normal compressibility, respiratory phasicity and response to augmentation. Saphenofemoral Junction: No evidence of thrombus. Normal compressibility and flow on color Doppler imaging. Profunda Femoral Vein: No evidence of thrombus. Normal compressibility and flow on color Doppler imaging. Femoral Vein: No evidence of thrombus. Normal compressibility, respiratory phasicity and response to augmentation. Popliteal Vein: No evidence of thrombus. Normal compressibility, respiratory phasicity and response to augmentation. Calf Veins: No evidence of thrombus. Normal compressibility and flow on color Doppler imaging. IMPRESSION: No evidence of deep venous thrombosis in either lower extremity. Electronically Signed   By: Feliberto Harts M.D.   On: 03/27/2023  18:16   DG Foot Complete Left  Result Date: 03/27/2023 CLINICAL DATA:  Pain EXAM: LEFT FOOT - COMPLETE 3 VIEW COMPARISON:  None Available. FINDINGS: No fracture or dislocation. Preserved joint spaces and bone mineralization. IMPRESSION: No acute osseous abnormality. Electronically Signed   By: Karen Kays M.D.   On: 03/27/2023 16:40   DG Foot Complete Right  Result Date: 03/27/2023 CLINICAL DATA:  Foot pain. EXAM: RIGHT FOOT COMPLETE - 3+ VIEW COMPARISON:  None Available. FINDINGS: There is no evidence of fracture or dislocation. There is no evidence of arthropathy or other focal bone abnormality. Soft tissues are unremarkable. IMPRESSION: Negative. Electronically Signed   By: Kennith Center M.D.   On: 03/27/2023 16:40   DG Knee Complete 4 Views Right  Result Date: 03/27/2023 CLINICAL DATA:  Right knee CP and foot pain. EXAM: RIGHT KNEE - COMPLETE 4+ VIEW COMPARISON:  None Available. FINDINGS: No evidence of fracture, dislocation, or joint effusion. No evidence of arthropathy or other focal bone abnormality. Soft tissues are unremarkable. IMPRESSION: Negative. Electronically Signed  By: Kennith Center M.D.   On: 03/27/2023 16:39    Procedures Procedures    Medications Ordered in ED Medications  acetaminophen (TYLENOL) tablet 650 mg (650 mg Oral Given 03/27/23 1729)    ED Course/ Medical Decision Making/ A&P                                 Medical Decision Making Amount and/or Complexity of Data Reviewed External Data Reviewed: notes.    Details: PCP conversation today Radiology: ordered and independent interpretation performed. Decision-making details documented in ED Course.  Risk OTC drugs.   This patient presents to the ED for concern of leg pain/edema, this involves an extensive number of treatment options, and is a complaint that carries with it a high risk of complications and morbidity.  The differential diagnosis includes DVT, cellulitis, dependent edema, etc   37 year old  female presents to the ED due to bilateral lower extremity edema and pain.  Sent by PCP to rule out DVT.  Admits to chronic left foot pain for the past month.  No injury.  No history of blood clots.  No chest pain or shortness of breath.  Upon arrival, patient afebrile, not tachycardic or hypoxic.  Patient well-appearing on exam.  Trace edema to bilateral ankles.  No pitting edema.  Mild right calf tenderness.  X-rays ordered in triage which I personally reviewed and interpreted which are negative for any bony fractures or acute abnormalities.  Added ultrasound to rule out DVT.  Patient given Tylenol for pain management. No erythema to suggest cellulitis.  [place] negative for DVT. Patient stable for discharge. Advised patient to follow-up with PCP for further evaluation. Strict ED precautions discussed with patient. Patient states understanding and agrees to plan. Patient discharged home in no acute distress and stable vitals  Lives at home Has PCP Hx htn       Final Clinical Impression(s) / ED Diagnoses Final diagnoses:  Pain of right lower extremity  Leg edema    Rx / DC Orders ED Discharge Orders     None         Mannie Stabile, PA-C 03/27/23 1823    Virgina Norfolk, DO 03/27/23 1936

## 2023-03-27 NOTE — Telephone Encounter (Signed)
Noted  

## 2023-03-27 NOTE — ED Triage Notes (Signed)
Pt c/o bilateral feet pain and R knee pain x 1 month and has became worse over the last week.

## 2023-03-27 NOTE — Discharge Instructions (Addendum)
It was a pleasure taking care of you today. As discussed, your x-rays were normal. Ultrasound did not show a blood clot. Continue taking ibuprofen or tylenol as needed for pain. Follow-up with PCP within the next days for recheck. Return to the ER for new or worsening symptoms.

## 2023-03-27 NOTE — Telephone Encounter (Signed)
Message from Destin M sent at 03/27/2023  9:09 AM EDT  Summary: Pain & Swelling in L &R foot   Pt is calling to report that L&R feet are swelling underneath her toes and fluid on the top of the foot. Appt was scheduled for yesterday. Pt missed appt. Patient reports pain in both feet 6/10. Please advise         Chief Complaint: swelling to both feet and tightness and calf pain to right leg only  Symptoms: painful to walk and calf pain  Frequency: Monday  Pertinent Negatives: Patient denies SOB, chest pain Disposition: [x] ED /[] Urgent Care (no appt availability in office) / [] Appointment(In office/virtual)/ []  [facility]/ [] Home Care/ [] Refused Recommended Disposition /[] [facility]/ []  Follow-up with PCP Additional Notes: r/o DVT  Reason for Disposition  [1] Thigh or calf pain AND [2] only 1 side AND [3] present > 1 hour  Answer Assessment - Initial Assessment Questions 1. ONSET: "When did the swelling start?" (e.g., minutes, hours, days)     Top: Monday - left foot swollen  right swollen top of feet toes - knee down  2. LOCATION: "What part of the leg is swollen?"  "Are both legs swollen or just one leg?"     Both feet - pain right knee down tight  3. SEVERITY: "How bad is the swelling?" (e.g., localized; mild, moderate, severe)   - Localized: Small area of swelling localized to one leg.   - MILD pedal edema: Swelling limited to foot and ankle, pitting edema < 1/4 inch (6 mm) deep, rest and elevation eliminate most or all swelling.   - MODERATE edema: Swelling of lower leg to knee, pitting edema > 1/4 inch (6 mm) deep, rest and elevation only partially reduce swelling.   - SEVERE edema: Swelling extends above knee, facial or hand swelling present.      mild 4. REDNESS: "Does the swelling look red or infected?"     no 5. PAIN: "Is the swelling painful to touch?" If Yes, ask: "How painful is it?"   (Scale 1-10; mild, moderate or severe)     Feet 6/10 right leg  by the knee  7. CAUSE: "What do you think is causing the leg swelling?"     DVT  8. MEDICAL HISTORY: "Do you have a history of blood clots (e.g., DVT), cancer, heart failure, kidney disease, or liver failure?"     no  10. OTHER SYMPTOMS: "Do you have any other symptoms?" (e.g., chest pain, difficulty breathing)       no  Protocols used: Leg Swelling and Edema-A-AH

## 2023-03-27 NOTE — ED Notes (Signed)
Reviewed AVS/discharge instruction with patient. Time allotted for and all questions answered. Patient is agreeable for d/c and escorted to ed exit by staff.  

## 2023-05-29 ENCOUNTER — Emergency Department (HOSPITAL_BASED_OUTPATIENT_CLINIC_OR_DEPARTMENT_OTHER): Payer: Medicaid Other

## 2023-05-29 ENCOUNTER — Ambulatory Visit
Admission: RE | Admit: 2023-05-29 | Discharge: 2023-05-29 | Disposition: A | Discharge status: Home / Self Care | Payer: Medicaid Other | Source: Ambulatory Visit | Attending: Internal Medicine | Admitting: Internal Medicine

## 2023-05-29 ENCOUNTER — Other Ambulatory Visit: Payer: Self-pay

## 2023-05-29 ENCOUNTER — Ambulatory Visit: Payer: Self-pay | Admitting: *Deleted

## 2023-05-29 ENCOUNTER — Encounter (HOSPITAL_BASED_OUTPATIENT_CLINIC_OR_DEPARTMENT_OTHER): Payer: Self-pay

## 2023-05-29 ENCOUNTER — Emergency Department (HOSPITAL_BASED_OUTPATIENT_CLINIC_OR_DEPARTMENT_OTHER)
Admission: EM | Admit: 2023-05-29 | Discharge: 2023-05-29 | Disposition: A | Discharge status: Home / Self Care | Payer: Medicaid Other | Attending: Emergency Medicine | Admitting: Emergency Medicine

## 2023-05-29 VITALS — BP 99/68 | HR 88 | Temp 98.8°F | Resp 18

## 2023-05-29 DIAGNOSIS — Z0189 Encounter for other specified special examinations: Secondary | ICD-10-CM | POA: Diagnosis not present

## 2023-05-29 DIAGNOSIS — M7989 Other specified soft tissue disorders: Secondary | ICD-10-CM | POA: Diagnosis not present

## 2023-05-29 DIAGNOSIS — R2232 Localized swelling, mass and lump, left upper limb: Secondary | ICD-10-CM | POA: Insufficient documentation

## 2023-05-29 DIAGNOSIS — Z79899 Other long term (current) drug therapy: Secondary | ICD-10-CM | POA: Insufficient documentation

## 2023-05-29 DIAGNOSIS — I10 Essential (primary) hypertension: Secondary | ICD-10-CM | POA: Insufficient documentation

## 2023-05-29 LAB — COMPREHENSIVE METABOLIC PANEL
ALT: 26 U/L (ref 0–44)
AST: 23 U/L (ref 15–41)
Albumin: 3.7 g/dL (ref 3.5–5.0)
Alkaline Phosphatase: 55 U/L (ref 38–126)
Anion gap: 14 (ref 5–15)
BUN: 10 mg/dL (ref 6–20)
CO2: 22 mmol/L (ref 22–32)
Calcium: 8.7 mg/dL — ABNORMAL LOW (ref 8.9–10.3)
Chloride: 101 mmol/L (ref 98–111)
Creatinine, Ser: 0.69 mg/dL (ref 0.44–1.00)
GFR, Estimated: >60 mL/min (ref >60)
Glucose, Bld: 97 mg/dL (ref 70–99)
Potassium: 3.5 mmol/L (ref 3.5–5.1)
Sodium: 137 mmol/L (ref 135–145)
Total Bilirubin: 0.6 mg/dL (ref 0.3–1.2)
Total Protein: 7.4 g/dL (ref 6.5–8.1)

## 2023-05-29 LAB — CBC
HCT: 32.1 % — ABNORMAL LOW (ref 36.0–46.0)
Hemoglobin: 10.7 g/dL — ABNORMAL LOW (ref 12.0–15.0)
MCH: 29.1 pg (ref 26.0–34.0)
MCHC: 33.3 g/dL (ref 30.0–36.0)
MCV: 87.2 fL (ref 80.0–100.0)
Platelets: 340 10*3/uL (ref 150–400)
RBC: 3.68 MIL/uL — ABNORMAL LOW (ref 3.87–5.11)
RDW: 13.2 % (ref 11.5–15.5)
WBC: 7.6 10*3/uL (ref 4.0–10.5)
nRBC: 0 % (ref 0.0–0.2)

## 2023-05-29 NOTE — Telephone Encounter (Signed)
  Chief Complaint: unexplained arm swelling- left Symptoms:Left arm swelling- inner arm and hand- started with some redness- no red now, pain present Frequency: started Tuesday Pertinent Negatives: Patient denies fever, injury, bitr Disposition: [] ED /[] Urgent Care (no appt availability in office) / [] Appointment(In office/virtual)/ []  [facility]/ [] Home Care/ [] Refused Recommended Disposition /[] [facility]/ [x]  Follow-up with PCP Additional Notes: Patient was seen at [facility] and ED for this today- no reason for swelling found- patient is worried and is requesting office follow up. No appointment available- will send high priority message and message to [facility] to request appointment.

## 2023-05-29 NOTE — ED Triage Notes (Signed)
Pt presents with c/o lt arm swelling and pain X 4 days.   Denies injuries.

## 2023-05-29 NOTE — Discharge Instructions (Signed)
Please go to the ER for further evaluation of your symptoms  

## 2023-05-29 NOTE — ED Triage Notes (Signed)
The patient is having left arm swelling and pain since Tuesday. No injury that she can remember. No fever at home.

## 2023-05-29 NOTE — Discharge Instructions (Addendum)
We did not see any evidence of a blood clot in your left arm today. The source of your swelling is not clear at this time.   Please follow-up with your PCP within the next week for recheck of your symptoms.  Return to the ER for any chest pain, shortness of breath, any other new or concerning symptoms.

## 2023-05-29 NOTE — ED Notes (Signed)
Patient is being discharged from the Urgent Care and sent to the Emergency Department via POV . Per Cheri Rous NP, patient is in need of higher level of care due to arm swelling. Patient is aware and verbalizes understanding of plan of care.  Vitals:   05/29/23 0816 05/29/23 0817  BP:  99/68  Pulse: 88   Resp: 18   Temp: 98.8 F (37.1 C)   SpO2: 95%

## 2023-05-29 NOTE — ED Provider Notes (Signed)
[facility]    CSN: [id number] Arrival date & time: 05/29/23  0806      History   Chief Complaint Chief Complaint  Patient presents with   Arm Swelling    Left arm on the inside of the elbow is swollen since three days ago, also certain parts of my hand, I was told it could be a lymph node, or potassium low I have no clue just need it checked out - Entered by patient    HPI [name] is a 37 y.o. female presents for evaluation of arm swelling. pt reports 2 days of worsening left arm redness, pain, swelling.  States she just woke up 1 morning with it.  Denies injury.  States the swelling extends into her hand and her forearm is also painful.  Denies any fevers or chills.  States she had an episode of chest pain last night getting off work that she states was a sharp/pressure pain that radiated through towards her back.  States it resolved on its own and has not returned.  No shortness of breath, dizziness, syncope.  No history of DVT or clotting disorders.  She was seen in the emergency room on August 2 for right lower extremity swelling and was ruled out for DVT.  No other concerns at this time.  HPI  Past Medical History:  Diagnosis Date   Anemia    Chronic hypertension during pregnancy, antepartum 12/24/2017   [x]  Aspirin 81 mg daily after 12 weeks; discontinue after 36 weeks Current antihypertensives:  None   Baseline and surveillance labs (pulled in from [facility], refresh links as needed)  Lab Results Component Value Date  PLT 353 11/25/2016  CREATININE 0.50 11/24/2017  AST 24 11/25/2016  ALT 17 11/25/2016  PROTCRRATIO 0.30 (H) 12/31/2015   Antenatal Testing CHTN - O10.919  Group I  BP < 140/90, no preecl   Hypertension    Pregnancy induced hypertension     Patient Active Problem List   Diagnosis Date Noted   Administrative encounter 01/01/2023   Severe obesity (BMI >= 40) (HCC) 09/30/2022   Patellar tendinitis of right knee 09/26/2022   Vagina, candidiasis  05/15/2022   BMI 38.0-38.9,adult 06/24/2021   Anemia 03/20/2021   Hypertension     Past Surgical History:  Procedure Laterality Date   TUBAL LIGATION Bilateral 07/20/2018   Procedure: POST PARTUM TUBAL LIGATION;  Surgeon: Levie Heritage, DO;  Location: [facility];  Service: Gynecology;  Laterality: Bilateral;    OB History     Gravida  4   Para  4   Term  4   Preterm  0   AB  0   Living  4      SAB  0   IAB  0   Ectopic  0   Multiple  0   Live Births  4            Home Medications    Prior to Admission medications   Medication Sig Start Date End Date Taking? Authorizing Provider  diclofenac Sodium (VOLTAREN) 1 % GEL Apply 2 g topically 4 (four) times daily. 09/30/22   Storm Frisk, MD  ferrous gluconate (FERGON) 324 MG tablet Take 1 tablet (324 mg total) by mouth 2 (two) times daily with a meal. Patient taking differently: Take 324 mg by mouth daily as needed. 05/16/22   Storm Frisk, MD  fluticasone (FLONASE) 50 MCG/ACT nasal spray Place 2 sprays into both nostrils daily. 01/01/23  Storm Frisk, MD  lisinopril-hydrochlorothiazide (ZESTORETIC) 20-12.5 MG tablet Take 1 tablet by mouth daily. 09/30/22   Storm Frisk, MD  naproxen (NAPROSYN) 500 MG tablet TAKE 1 TABLET(500 MG) BY MOUTH TWICE DAILY AS NEEDED FOR PAIN 10/13/22   Storm Frisk, MD  nystatin (MYCOSTATIN/NYSTOP) powder Apply 1 Application topically 3 (three) times daily. 01/01/23   Storm Frisk, MD    Family History Family History  Problem Relation Age of Onset   Stroke Mother    Hypertension Mother    Hypertension Father     Social History Social History   Tobacco Use   Smoking status: Former    Current packs/day: 0.00    Types: Cigarettes    Quit date: 03/29/2013    Years since quitting: 10.1   Smokeless tobacco: Never  Vaping Use   Vaping status: Never Used  Substance Use Topics   Alcohol use: Yes    Comment: occ   Drug use: No     Allergies    Patient has no known allergies.   Review of Systems Review of Systems  Skin:        Left arm swelling     Physical Exam Triage Vital Signs ED Triage Vitals  Encounter Vitals Group     BP 05/29/23 0817 99/68     Systolic BP Percentile --      Diastolic BP Percentile --      Pulse Rate 05/29/23 0816 88     Resp 05/29/23 0816 18     Temp 05/29/23 0816 98.8 F (37.1 C)     Temp Source 05/29/23 0816 Oral     SpO2 05/29/23 0816 95 %     Weight --      Height --      Head Circumference --      Peak Flow --      Pain Score 05/29/23 0815 4     Pain Loc --      Pain Education --      Exclude from Growth Chart --    No data found.  Updated Vital Signs BP 99/68   Pulse 88   Temp 98.8 F (37.1 C) (Oral)   Resp 18   LMP 05/23/2023 (Exact Date)   SpO2 95%   Visual Acuity Right Eye Distance:   Left Eye Distance:   Bilateral Distance:    Right Eye Near:   Left Eye Near:    Bilateral Near:     Physical Exam Vitals and nursing note reviewed.  Constitutional:      General: She is not in acute distress.    Appearance: Normal appearance. She is not ill-appearing.  HENT:     Head: Normocephalic and atraumatic.  Eyes:     Pupils: Pupils are equal, round, and reactive to light.  Cardiovascular:     Rate and Rhythm: Normal rate and regular rhythm.     Heart sounds: Normal heart sounds.  Pulmonary:     Effort: Pulmonary effort is normal.     Breath sounds: Normal breath sounds.  Skin:    General: Skin is warm and dry.          Comments: There is erythema, warmth and significant tenderness to palpation to the medial left interim/epicondyles.  Skin is intact without abrasions on palms.  Tenderness does extend into forearm open there is no swelling or erythema.  Brachial pulse +2.  Left arm measures 34 cm at the elbow and right arm measures 32  cm  Neurological:     General: No focal deficit present.     Mental Status: She is alert and oriented to person, place, and time.   Psychiatric:        Mood and Affect: Mood normal.        Behavior: Behavior normal.      UC Treatments / Results  Labs (all labs ordered are listed, but only abnormal results are displayed) Labs Reviewed - No data to display  EKG   Radiology No results found.  Procedures Procedures (including critical care time)  Medications Ordered in UC Medications - No data to display  Initial Impression / Assessment and Plan / UC Course  I have reviewed the triage vital signs and the nursing notes.  Pertinent labs & imaging results that were available during my care of the patient were reviewed by me and considered in my medical decision making (see chart for details).     Reviewed exam and symptoms with patient.  No red flags.  Given presentation as well as episode of chest pain last night advise she go to the ER for DVT rule out.  Patient denies any chest pain or shortness of breath at time of evaluation.  Patient is in agreement with plan will go POV to the emergency room.  She was instructed to pull over she develops any worsening symptoms in transit and she verbalized understanding. Final Clinical Impressions(s) / UC Diagnoses   Final diagnoses:  Left arm swelling     Discharge Instructions      Please go to the ER for further evaluation of your symptoms    ED Prescriptions   None    PDMP not reviewed this encounter.   Radford Pax, NP 05/29/23 [phone]

## 2023-05-29 NOTE — Telephone Encounter (Signed)
Summary: swollen arm   Patient called stated her left arm is swollen, hot to the touch and painful. She went to the ER today and was advised there was nothing wrong but she says she wants to see her physician. No appts until next week     Reason for Disposition  MODERATE arm swelling (e.g., puffiness or swollen feeling of entire arm)  Answer Assessment - Initial Assessment Questions 1. ONSET: "When did the swelling start?" (e.g., minutes, hours, days)     Swelling started Tuesday 2. LOCATION: "What part of the arm is swollen?"  "Are both arms swollen or just one arm?"     Left arm- inner arm from under arm to hand 3. SEVERITY: "How bad is the swelling?" (e.g., localized; mild, moderate, severe)   - LOCALIZED: Small area of puffiness or swelling on just one arm   - JOINT SWELLING: Swelling of one joint   - MILD: Puffiness or swelling of hand   - MODERATE: Puffiness or swollen feeling of entire arm    - SEVERE: All of arm looks swollen; pitting edema     moderate 4. REDNESS: "Does the swelling look red or infected?"     No- was red when started- not now 5. PAIN: "Is the swelling painful to touch?" If Yes, ask: "How painful is it?"   (Scale 1-10; mild, moderate or severe)     Severe- unable to grip tight to hold things 6. FEVER: "Do you have a fever?" If Yes, ask: "What is it, how was it measured, and when did it start?"      no 7. CAUSE: "What do you think is causing the arm swelling?"     unsure 8. MEDICAL HISTORY: "Do you have a history of heart failure, kidney disease, liver failure, or cancer?"     no 9. RECURRENT SYMPTOM: "Have you had arm swelling before?" If Yes, ask: "When was the last time?" "What happened that time?"     Never- feet have swollen before 10. OTHER SYMPTOMS: "Do you have any other symptoms?" (e.g., chest pain, difficulty breathing)       No injury, no bite 11. PREGNANCY: "Is there any chance you are pregnant?" "When was your last menstrual period?"        No-BTL  Protocols used: Arm Swelling and Edema-A-AH

## 2023-05-29 NOTE — ED Provider Notes (Signed)
[place] [facility] Provider Note   CSN: [id number] Arrival date & time: 05/29/23  7829     History  Chief Complaint  Patient presents with   Arm Swelling    [name] is a 37 y.o. female with history of hypertension, who presents with concern for swelling over her left upper arm that began on 05/26/2023.  Denies any injuries to the area, states this came on all of a sudden.  Denies any history of blood clots, any recent long plane or car rides, hospitalizations, recent surgeries, hormonal medication use.  Denies any shortness of breath or chest pain.  Was evaluated first to urgent care and told to come to the ER for further evaluation.  HPI     Home Medications Prior to Admission medications   Medication Sig Start Date End Date Taking? Authorizing Provider  diclofenac Sodium (VOLTAREN) 1 % GEL Apply 2 g topically 4 (four) times daily. 09/30/22   Storm Frisk, MD  ferrous gluconate (FERGON) 324 MG tablet Take 1 tablet (324 mg total) by mouth 2 (two) times daily with a meal. Patient taking differently: Take 324 mg by mouth daily as needed. 05/16/22   Storm Frisk, MD  fluticasone (FLONASE) 50 MCG/ACT nasal spray Place 2 sprays into both nostrils daily. 01/01/23   Storm Frisk, MD  lisinopril-hydrochlorothiazide (ZESTORETIC) 20-12.5 MG tablet Take 1 tablet by mouth daily. 09/30/22   Storm Frisk, MD  naproxen (NAPROSYN) 500 MG tablet TAKE 1 TABLET(500 MG) BY MOUTH TWICE DAILY AS NEEDED FOR PAIN 10/13/22   Storm Frisk, MD  nystatin (MYCOSTATIN/NYSTOP) powder Apply 1 Application topically 3 (three) times daily. 01/01/23   Storm Frisk, MD      Allergies    Patient has no known allergies.    Review of Systems   Review of Systems  Skin:        Swelling    Physical Exam Updated Vital Signs BP 102/66   Pulse 87   Temp 98.5 F (36.9 C) (Oral)   Resp 18   Ht 5\' 2"  (1.575 m)   Wt 97.5 kg   LMP 05/23/2023 (Exact  Date)   SpO2 94%   BMI 39.32 kg/m  Physical Exam Vitals and nursing note reviewed.  Constitutional:      General: She is not in acute distress.    Appearance: Normal appearance.  HENT:     Head: Atraumatic.  Cardiovascular:     Rate and Rhythm: Normal rate and regular rhythm.     Comments: 2+ radial pulses bilaterally Pulmonary:     Effort: Pulmonary effort is normal. No respiratory distress.     Breath sounds: Normal breath sounds.  Musculoskeletal:     Comments: Able to fully flex and extend the elbows bilaterally.  No edema in the elbow joint itself.  No erythema or edema of the elbow joint bilaterally.  Skin:         Comments: Area of edema with no erythema just proximal to the antecubital fossa.  Tender to palpation in this area  Neurological:     General: No focal deficit present.     Mental Status: She is alert.     Comments: Intact sensation in the  1st through 5th digits of the upper extremity bilaterally  5/5 resisted elbow extension and flexion on the right side, 4/5 on the left due to pain  Psychiatric:        Mood and Affect:  Mood normal.        Behavior: Behavior normal.     ED Results / Procedures / Treatments   Labs (all labs ordered are listed, but only abnormal results are displayed) Labs Reviewed  CBC - Abnormal; Notable for the following components:      Result Value   RBC 3.68 (*)    Hemoglobin 10.7 (*)    HCT 32.1 (*)    All other components within normal limits  COMPREHENSIVE METABOLIC PANEL - Abnormal; Notable for the following components:   Calcium 8.7 (*)    All other components within normal limits    EKG None  Radiology US Venous Img Upper Left (DVT Study)  Result Date: 05/29/2023 EXAM: UPPER EXTREMITY VENOUS DOPPLER ULTRASOUND TECHNIQUE: Gray-scale sonography with graded compression, as well as color Doppler and duplex ultrasound were performed to evaluate the upper extremity deep venous system from the level of the subclavian vein  and including the jugular, axillary, basilic, radial, ulnar and upper cephalic vein. Spectral Doppler was utilized to evaluate flow at rest and with distal augmentation maneuvers. COMPARISON:  None Available. FINDINGS: Contralateral Subclavian Vein: Respiratory phasicity is normal and symmetric with the symptomatic side. No evidence of thrombus. Normal compressibility. Internal Jugular Vein: No evidence of thrombus. Normal compressibility, respiratory phasicity and response to augmentation. Subclavian Vein: No evidence of thrombus. Normal compressibility, respiratory phasicity and response to augmentation. Axillary Vein: No evidence of thrombus. Normal compressibility, respiratory phasicity and response to augmentation. Cephalic Vein: No evidence of thrombus. Normal compressibility, respiratory phasicity and response to augmentation. Basilic Vein: No evidence of thrombus. Normal compressibility, respiratory phasicity and response to augmentation. Brachial Veins: No evidence of thrombus. Normal compressibility, respiratory phasicity and response to augmentation. Radial Veins: No evidence of thrombus. Normal compressibility, respiratory phasicity and response to augmentation. Ulnar Veins: Limited visualization, however no findings of thrombus. Other Findings:  None visualized. IMPRESSION: No evidence of DVT within the left upper extremity. Electronically Signed   By: Olive Bass M.D.   On: 05/29/2023 10:38    Procedures Procedures    Medications Ordered in ED Medications - No data to display  ED Course/ Medical Decision Making/ A&P                                 Medical Decision Making Amount and/or Complexity of Data Reviewed Labs: ordered.   37 y.o. female with pertinent past medical history of hypertension presents to the ED for concern of localized swelling of the left upper extremity just proximal to the elbow  Differential diagnosis includes but is not limited to upper extremity DVT,  lymphedema, soft tissue injury  ED Course:  Patient has localized soft tissue swelling of the left arm just proximal to the antecubital fossa.  She denies any injuries or DVT risk factors.  Reports she noticed this about 4 days ago and it has remained about the same size.  Radial pulses 2+ bilaterally, intact sensation in the 1st through 5th digits of the upper extremity bilaterally, full range of motion of the elbow bilaterally.  Some tenderness to palpation over this area of edema.  DVT ultrasound of the left upper extremity without any evidence of DVT.  Unclear etiology for the localized edema.  No chest pain, shortness of breath, or vital sign abnormalities.  CMP, CBC at patient's baseline. Appropriate for discharge home at this time  Impression: Localized edema to the left upper extremity  Disposition:  The patient was discharged home with instructions to follow-up with PCP within the next week for recheck of symptoms Return precautions given.  Lab Tests: I Ordered, and personally interpreted labs.  The pertinent results include:   CMP, CBC unremarkable  Imaging Studies ordered: I ordered imaging studies including DVT ultrasound left upper extremity I independently visualized the imaging with scope of interpretation limited to determining acute life threatening conditions related to emergency care. Imaging showed no signs of DVT I agree with the radiologist interpretation  External records from outside source obtained and reviewed including urgent care note from 05/29/23 where she was sent to ER for further evaluation           Final Clinical Impression(s) / ED Diagnoses Final diagnoses:  Localized swelling of left upper extremity    Rx / DC Orders ED Discharge Orders     None         Arabella Merles, PA-C 05/29/23 1110    Benjiman Core, MD 05/29/23 1454

## 2023-06-01 NOTE — Telephone Encounter (Signed)
Patient on the walk-in schedule for tomorrow

## 2023-06-02 ENCOUNTER — Encounter: Payer: Self-pay | Admitting: Family Medicine

## 2023-06-02 ENCOUNTER — Ambulatory Visit: Payer: Medicaid Other | Attending: Critical Care Medicine | Admitting: Family Medicine

## 2023-06-02 VITALS — BP 100/67 | HR 84 | Ht 62.0 in | Wt 213.4 lb

## 2023-06-02 DIAGNOSIS — R609 Edema, unspecified: Secondary | ICD-10-CM | POA: Diagnosis not present

## 2023-06-02 DIAGNOSIS — R200 Anesthesia of skin: Secondary | ICD-10-CM | POA: Diagnosis not present

## 2023-06-02 DIAGNOSIS — M79662 Pain in left lower leg: Secondary | ICD-10-CM | POA: Diagnosis not present

## 2023-06-02 MED ORDER — FUROSEMIDE 20 MG PO TABS: 20.0000 mg | ORAL_TABLET | Freq: Every day | ORAL | 0 refills | Status: DC

## 2023-06-02 NOTE — Progress Notes (Signed)
Subjective:  Patient ID: [name], female    DOB: Feb 13, 1986  Age: 37 y.o. MRN: [id number]  CC: Arm Swelling   HPI [name] is a 37 y.o. year old female with a history of hypertension here for an acute visit.  She is a patient of Dr. Delford Field who has currently retired.  Interval History: Discussed the use of AI scribe software for clinical note transcription with the patient, who gave verbal consent to proceed.  She presents with a several month history of migrating swelling and pain. The symptoms began in July with bilateral foot swelling, then progressed to the legs, and most recently to the arms. The swelling in the left arm has decreased and is now noted in the right arm. The patient describes the swelling as 'hard' and associated with pain. She also reports numbness and tingling in the hands, and difficulty making a fist when the hand is swollen. The patient denies shortness of breath, but reports intermittent chest pain. She has sought care at urgent care and the emergency department, where an ultrasound although the DVT in the left upper extremity.. The patient denies any known food allergies.      Past Medical History:  Diagnosis Date   Anemia    Chronic hypertension during pregnancy, antepartum 12/24/2017   [x]  Aspirin 81 mg daily after 12 weeks; discontinue after 36 weeks Current antihypertensives:  None   Baseline and surveillance labs (pulled in from [facility], refresh links as needed)  Lab Results Component Value Date  PLT 353 11/25/2016  CREATININE 0.50 11/24/2017  AST 24 11/25/2016  ALT 17 11/25/2016  PROTCRRATIO 0.30 (H) 12/31/2015   Antenatal Testing CHTN - O10.919  Group I  BP < 140/90, no preecl   Hypertension    Pregnancy induced hypertension     Past Surgical History:  Procedure Laterality Date   TUBAL LIGATION Bilateral 07/20/2018   Procedure: POST PARTUM TUBAL LIGATION;  Surgeon: Levie Heritage, DO;  Location: [facility];  Service: Gynecology;   Laterality: Bilateral;    Family History  Problem Relation Age of Onset   Stroke Mother    Hypertension Mother    Hypertension Father     Social History   Socioeconomic History   Marital status: Single    Spouse name: Not on file   Number of children: Not on file   Years of education: Not on file   Highest education level: Not on file  Occupational History   Not on file  Tobacco Use   Smoking status: Former    Current packs/day: 0.00    Types: Cigarettes    Quit date: 03/29/2013    Years since quitting: 10.1   Smokeless tobacco: Never  Vaping Use   Vaping status: Never Used  Substance and Sexual Activity   Alcohol use: Yes    Comment: occ   Drug use: No   Sexual activity: Yes    Birth control/protection: Condom  Other Topics Concern   Not on file  Social History Narrative   Not on file   Social Determinants of Health   Financial Resource Strain: Not on file  Food Insecurity: No Food Insecurity (08/31/2018)   Hunger Vital Sign    Worried About Running Out of Food in the Last Year: Never true    Ran Out of Food in the Last Year: Never true  Transportation Needs: No Transportation Needs (08/31/2018)   PRAPARE - Administrator, Civil Service (Medical): No  Lack of Transportation (Non-Medical): No  Physical Activity: Not on file  Stress: Not on file  Social Connections: Not on file    No Known Allergies  Outpatient Medications Prior to Visit  Medication Sig Dispense Refill   diclofenac Sodium (VOLTAREN) 1 % GEL Apply 2 g topically 4 (four) times daily. 100 g 1   ferrous gluconate (FERGON) 324 MG tablet Take 1 tablet (324 mg total) by mouth 2 (two) times daily with a meal. (Patient taking differently: Take 324 mg by mouth daily as needed.) 60 tablet 3   fluticasone (FLONASE) 50 MCG/ACT nasal spray Place 2 sprays into both nostrils daily. 16 g 6   lisinopril-hydrochlorothiazide (ZESTORETIC) 20-12.5 MG tablet Take 1 tablet by mouth daily. 90 tablet 3    naproxen (NAPROSYN) 500 MG tablet TAKE 1 TABLET(500 MG) BY MOUTH TWICE DAILY AS NEEDED FOR PAIN (Patient not taking: Reported on 06/02/2023) 60 tablet 0   nystatin (MYCOSTATIN/NYSTOP) powder Apply 1 Application topically 3 (three) times daily. (Patient not taking: Reported on 06/02/2023) 15 g 0   No facility-administered medications prior to visit.     ROS Review of Systems  Constitutional:  Negative for activity change and appetite change.  HENT:  Negative for sinus pressure and sore throat.   Respiratory:  Negative for chest tightness, shortness of breath and wheezing.   Cardiovascular:  Negative for chest pain and palpitations.  Gastrointestinal:  Negative for abdominal distention, abdominal pain and constipation.  Genitourinary: Negative.   Musculoskeletal: Negative.   Psychiatric/Behavioral:  Negative for behavioral problems and dysphoric mood.     Objective:  BP 100/67   Pulse 84   Ht 5\' 2"  (1.575 m)   Wt 213 lb 6.4 oz (96.8 kg)   LMP 05/23/2023 (Exact Date)   SpO2 99%   BMI 39.03 kg/m      06/02/2023    9:56 AM 05/29/2023    9:06 AM 05/29/2023    9:03 AM  BP/Weight  Systolic BP 100 102   Diastolic BP 67 66   Wt. (Lbs) 213.4  215  BMI 39.03 kg/m2  39.32 kg/m2      Physical Exam Vitals reviewed.  Constitutional:      Appearance: She is well-developed.  Cardiovascular:     Rate and Rhythm: Normal rate.     Heart sounds: Normal heart sounds. No murmur heard. Pulmonary:     Effort: Pulmonary effort is normal.     Breath sounds: Normal breath sounds. No wheezing or rales.  Chest:     Chest wall: No tenderness.  Abdominal:     General: Bowel sounds are normal. There is no distension.     Palpations: Abdomen is soft. There is no mass.     Tenderness: There is no abdominal tenderness.  Musculoskeletal:        General: Swelling (medial aspect of right forearm) present.     Right lower leg: No edema.     Left lower leg: No edema.     Comments: +ve Homans' sign in  the left leg  Neurological:     Mental Status: She is alert and oriented to person, place, and time.  Psychiatric:        Mood and Affect: Mood normal.        Latest Ref Rng & Units 05/29/2023    9:27 AM 09/30/2022    3:37 PM 06/24/2021    4:38 PM  CMP  Glucose 70 - 99 mg/dL 97  88  79   BUN  6 - 20 mg/dL 10  16  11    Creatinine 0.44 - 1.00 mg/dL 0.10  2.72  5.36   Sodium 135 - 145 mmol/L 137  138  136   Potassium 3.5 - 5.1 mmol/L 3.5  4.6  4.1   Chloride 98 - 111 mmol/L 101  101  98   CO2 22 - 32 mmol/L 22  22  22    Calcium 8.9 - 10.3 mg/dL 8.7  9.9  9.4   Total Protein 6.5 - 8.1 g/dL 7.4   7.6   Total Bilirubin 0.3 - 1.2 mg/dL 0.6   0.4   Alkaline Phos 38 - 126 U/L 55   56   AST 15 - 41 U/L 23   20   ALT 0 - 44 U/L 26   19     Lipid Panel  No results found for: "CHOL", "TRIG", "HDL", "CHOLHDL", "VLDL", "LDLCALC", "LDLDIRECT"  CBC    Component Value Date/Time   WBC 7.6 05/29/2023 0927   RBC 3.68 (L) 05/29/2023 0927   HGB 10.7 (L) 05/29/2023 0927   HGB 12.4 06/24/2021 1638   HCT 32.1 (L) 05/29/2023 0927   HCT 37.1 06/24/2021 1638   PLT 340 05/29/2023 0927   PLT 389 06/24/2021 1638   MCV 87.2 05/29/2023 0927   MCV 89 06/24/2021 1638   MCH 29.1 05/29/2023 0927   MCHC 33.3 05/29/2023 0927   RDW 13.2 05/29/2023 0927   RDW 12.1 06/24/2021 1638   LYMPHSABS 3.4 (H) 06/24/2021 1638   MONOABS 0.3 03/04/2021 1531   EOSABS 0.1 06/24/2021 1638   BASOSABS 0.0 06/24/2021 1638    Lab Results  Component Value Date   HGBA1C 4.7 (L) 06/24/2021    Assessment & Plan:      Unexplained Swelling Patient presents with a history of migrating swelling starting in July, initially in the feet, then legs, and now in the arms. The swelling is associated with pain and numbness. No history of similar episodes. No known allergies. No associated shortness of breath or chest pain. -Order blood tests to check for autoimmune conditions. -Order ultrasound to rule out deep vein thrombosis in  the left leg, given the pain in the calf. -Prescribe a diuretic for five days to help reduce the swelling. -Advise patient to keep a journal of symptoms. -If symptoms persist and no answers are found, consider referral for nerve conduction studies.  Numbness -Occurring mostly in the left hand -This is associated with edema of her hand -If workup is negative for autoimmune condition and numbness persist consider referral for nerve conduction study         Meds ordered this encounter  Medications   furosemide (LASIX) 20 MG tablet    Sig: Take 1 tablet (20 mg total) by mouth daily.    Dispense:  5 tablet    Refill:  0    Follow-up: Return for To establish care with any available PCP.       Hoy Register, MD, FAAFP. [facility] [place], [place] [phone]   06/02/2023, 11:13 AM

## 2023-06-03 ENCOUNTER — Ambulatory Visit: Payer: Self-pay | Admitting: *Deleted

## 2023-06-03 ENCOUNTER — Ambulatory Visit (HOSPITAL_COMMUNITY)
Admission: RE | Admit: 2023-06-03 | Discharge: 2023-06-03 | Disposition: A | Discharge status: Home / Self Care | Payer: Medicaid Other | Source: Ambulatory Visit | Attending: Vascular Surgery | Admitting: Vascular Surgery

## 2023-06-03 DIAGNOSIS — M79662 Pain in left lower leg: Secondary | ICD-10-CM

## 2023-06-03 NOTE — Telephone Encounter (Signed)
Noted  

## 2023-06-03 NOTE — Telephone Encounter (Signed)
Call received from [facility] , [facility] to report preliminary results for [place] to Dr. Alvis Lemmings from 06/02/23 : negative for DVT or superficial thrombus in left leg.

## 2023-06-04 ENCOUNTER — Other Ambulatory Visit: Payer: Self-pay | Admitting: Family Medicine

## 2023-06-04 DIAGNOSIS — R609 Edema, unspecified: Secondary | ICD-10-CM

## 2023-06-04 DIAGNOSIS — R768 Other specified abnormal immunological findings in serum: Secondary | ICD-10-CM

## 2023-06-04 LAB — C-REACTIVE PROTEIN: CRP: 40 mg/L — ABNORMAL HIGH (ref 0–10)

## 2023-06-04 LAB — ANA,IFA RA DIAG PNL W/RFLX TIT/PATN
ANA Titer 1: POSITIVE — AB
Cyclic Citrullin Peptide Ab: 6 U (ref 0–19)
Rheumatoid fact SerPl-aCnc: <10 [IU]/mL (ref <14.0)

## 2023-06-04 LAB — PRO B NATRIURETIC PEPTIDE: NT-Pro BNP: <36 pg/mL (ref 0–130)

## 2023-06-04 LAB — ANTI-SMITH ANTIBODY: ENA SM Ab Ser-aCnc: 0.7 AI (ref 0.0–0.9)

## 2023-06-04 LAB — FANA STAINING PATTERNS: Speckled Pattern: 1:320 {titer} — ABNORMAL HIGH

## 2023-06-04 LAB — ANTI-DNA ANTIBODY, DOUBLE-STRANDED: dsDNA Ab: 1 [IU]/mL (ref 0–9)

## 2023-06-04 LAB — VITAMIN B12: Vitamin B-12: 473 pg/mL (ref 232–1245)

## 2023-06-04 LAB — SEDIMENTATION RATE: Sed Rate: 25 mm/h (ref 0–32)

## 2023-06-05 ENCOUNTER — Other Ambulatory Visit: Payer: Self-pay

## 2023-06-05 ENCOUNTER — Encounter: Payer: Self-pay | Admitting: Family Medicine

## 2023-06-05 ENCOUNTER — Other Ambulatory Visit: Payer: Self-pay | Admitting: Family Medicine

## 2023-06-05 MED ORDER — PREDNISONE 20 MG PO TABS
20.0000 mg | ORAL_TABLET | Freq: Every day | ORAL | 0 refills | Status: DC
Start: 2023-06-05 — End: 2023-08-10
  Filled 2023-06-05: qty 5, 5d supply, fill #0

## 2023-06-08 ENCOUNTER — Telehealth: Payer: Self-pay

## 2023-06-08 ENCOUNTER — Encounter: Payer: Self-pay | Admitting: Family Medicine

## 2023-06-08 ENCOUNTER — Other Ambulatory Visit: Payer: Self-pay

## 2023-06-08 ENCOUNTER — Other Ambulatory Visit: Payer: Self-pay | Admitting: Family Medicine

## 2023-06-08 DIAGNOSIS — H539 Unspecified visual disturbance: Secondary | ICD-10-CM

## 2023-06-08 DIAGNOSIS — S025XXA Fracture of tooth (traumatic), initial encounter for closed fracture: Secondary | ICD-10-CM

## 2023-06-08 MED ORDER — BENZONATATE 100 MG PO CAPS
100.0000 mg | ORAL_CAPSULE | Freq: Two times a day (BID) | ORAL | 0 refills | Status: DC | PRN
  Filled 2023-06-08: qty 20, 10d supply, fill #0

## 2023-06-08 MED ORDER — CETIRIZINE HCL 10 MG PO TABS
10.0000 mg | ORAL_TABLET | Freq: Every day | ORAL | 1 refills | Status: DC
  Filled 2023-06-08: qty 30, 30d supply, fill #0
  Filled 2023-07-04: qty 30, 30d supply, fill #1

## 2023-06-08 NOTE — Telephone Encounter (Signed)
Patient sent message requesting a dentist referral and an ophthalmology referral, patient states she was seen for this 10/08

## 2023-06-08 NOTE — Telephone Encounter (Signed)
Routing to provider  

## 2023-06-09 ENCOUNTER — Other Ambulatory Visit: Payer: Self-pay

## 2023-06-09 ENCOUNTER — Ambulatory Visit: Payer: Self-pay

## 2023-06-09 NOTE — Telephone Encounter (Signed)
Left message on voicemail to return call.   Message per Nurse Earlene Plater.      Summary: sinus concern     The patient would like to be contacted by a member of clinical staff when possible to discuss congestion, nose bleeds and clotting that they are experiencing in their left nostril  The patient shares that it has been a daily occurrence for roughly two weeks  The patient is concerned that it is occurring regularly but only on their left side  Please contact further when possible

## 2023-06-09 NOTE — Telephone Encounter (Signed)
Summary: sinus concern   The patient would like to be contacted by a member of clinical staff when possible to discuss congestion, nose bleeds and clotting that they are experiencing in their left nostril  The patient shares that it has been a daily occurrence for roughly two weeks  The patient is concerned that it is occurring regularly but only on their left side  Please contact further when possible       Chief Complaint: Sinus pain, pressure with bloody discharge from left nare. Declines Mobile Uit. Wants to know what Dr. Alvis Lemmings thinks. Symptoms: Above Frequency: 1 week ago Pertinent Negatives: Patient denies fever Disposition: [] ED /[] Urgent Care (no appt availability in office) / [] Appointment(In office/virtual)/ []  [place] [facility]/ [] Home Care/ [x] Refused Recommended Disposition /[] [place] [facility]/ [x]  Follow-up with PCP Additional Notes: Please advise pt.  Reason for Disposition  [1] SEVERE pain AND [2] not improved 2 hours after pain medicine  Answer Assessment - Initial Assessment Questions 1. LOCATION: "Where does it hurt?"      Face 2. ONSET: "When did the sinus pain start?"  (e.g., hours, days)      1 week ago 3. SEVERITY: "How bad is the pain?"   (Scale 1-10; mild, moderate or severe)   - MILD (1-3): doesn't interfere with normal activities    - MODERATE (4-7): interferes with normal activities (e.g., work or school) or awakens from sleep   - SEVERE (8-10): excruciating pain and patient unable to do any normal activities        10 4. RECURRENT SYMPTOM: "Have you ever had sinus problems before?" If Yes, ask: "When was the last time?" and "What happened that time?"      Yes 5. NASAL CONGESTION: "Is the nose blocked?" If Yes, ask: "Can you open it or must you breathe through your mouth?"     No 6. NASAL DISCHARGE: "Do you have discharge from your nose?" If so ask, "What color?"     Bloody with clots 7. FEVER: "Do you have a fever?" If Yes, ask: "What is  it, how was it measured, and when did it start?"      No 8. OTHER SYMPTOMS: "Do you have any other symptoms?" (e.g., sore throat, cough, earache, difficulty breathing)     Cough 9. PREGNANCY: "Is there any chance you are pregnant?" "When was your last menstrual period?"     No  Protocols used: Sinus Pain or Congestion-A-AH

## 2023-06-09 NOTE — Telephone Encounter (Signed)
She came in for an acute visit for swelling in her extremities.  I do not recall request for this referral.  What is the indication for the referral?  Thank you.

## 2023-06-11 NOTE — Telephone Encounter (Signed)
Dental referral needed for teeth breaking off Ophthalmology referral need to check vision

## 2023-06-12 ENCOUNTER — Ambulatory Visit: Payer: Commercial Managed Care - PPO | Admitting: Internal Medicine

## 2023-06-12 NOTE — Addendum Note (Signed)
Addended by: Hoy Register on: 06/12/2023 11:45 AM   Modules accepted: Orders

## 2023-06-12 NOTE — Telephone Encounter (Signed)
Referral has been placed. 

## 2023-06-13 ENCOUNTER — Encounter: Payer: Self-pay | Admitting: Family Medicine

## 2023-06-15 ENCOUNTER — Encounter: Payer: Self-pay | Admitting: Family Medicine

## 2023-07-04 ENCOUNTER — Other Ambulatory Visit: Payer: Self-pay | Admitting: Family Medicine

## 2023-07-06 ENCOUNTER — Other Ambulatory Visit (HOSPITAL_COMMUNITY): Payer: Self-pay

## 2023-07-06 MED ORDER — BENZONATATE 100 MG PO CAPS
100.0000 mg | ORAL_CAPSULE | Freq: Two times a day (BID) | ORAL | 0 refills | Status: DC | PRN
  Filled 2023-07-06: qty 20, 10d supply, fill #0

## 2023-07-06 NOTE — Telephone Encounter (Signed)
Requested medications are due for refill today.  unsure  Requested medications are on the active medications list.  yes  Last refill. 06/08/2023 #20 0 rf  Future visit scheduled.   yes  Notes to clinic.  Please review for refill.    Requested Prescriptions  Pending Prescriptions Disp Refills   benzonatate (TESSALON) 100 MG capsule 20 capsule 0    Sig: Take 1 capsule (100 mg total) by mouth 2 (two) times daily as needed for cough.     Ear, Nose, and Throat:  Antitussives/Expectorants Passed - 07/04/2023 11:24 AM      Passed - Valid encounter within last 12 months    Recent Outpatient Visits           1 month ago Pain of left calf   [facility] [place] - A Dept Of [place]. [facility] Hoy Register, MD   6 months ago Administrative encounter   [place] [facility] [place] - A Dept Of [place]. [facility] Storm Frisk, MD   7 months ago Patellar tendinitis of right knee   [place] [facility] - A Dept Of [place]. [facility] [place], Marylene Land M, [place]   9 months ago Hypertension, unspecified type   [facility] - A Dept Of [place]. [facility] Storm Frisk, MD   9 months ago Contusion of right knee, subsequent encounter   [place] [facility] [place] - A Dept Of . [facility] [place], Marzella Schlein, [place]       Future Appointments             Tomorrow Hoy Register, MD [facility] [place] - A Dept Of [place]igha Bridegroom. [facility]   In 5 months Deveshwar, Janalyn Rouse, MD [facility] - A Dept Of . [facility]

## 2023-07-07 ENCOUNTER — Other Ambulatory Visit (HOSPITAL_COMMUNITY): Payer: Self-pay

## 2023-07-07 ENCOUNTER — Ambulatory Visit: Payer: Medicaid Other | Attending: Family Medicine | Admitting: Family Medicine

## 2023-07-07 ENCOUNTER — Encounter: Payer: Self-pay | Admitting: Family Medicine

## 2023-07-07 ENCOUNTER — Other Ambulatory Visit: Payer: Self-pay

## 2023-07-07 VITALS — BP 107/69 | HR 77 | Ht 62.0 in | Wt 220.8 lb

## 2023-07-07 DIAGNOSIS — I10 Essential (primary) hypertension: Secondary | ICD-10-CM

## 2023-07-07 DIAGNOSIS — R768 Other specified abnormal immunological findings in serum: Secondary | ICD-10-CM

## 2023-07-07 DIAGNOSIS — R7689 Other specified abnormal immunological findings in serum: Secondary | ICD-10-CM

## 2023-07-07 DIAGNOSIS — K5909 Other constipation: Secondary | ICD-10-CM | POA: Diagnosis not present

## 2023-07-07 MED ORDER — LUBIPROSTONE 8 MCG PO CAPS: 8.0000 ug | ORAL_CAPSULE | Freq: Two times a day (BID) | ORAL | 3 refills | Status: DC

## 2023-07-07 MED ORDER — LISINOPRIL-HYDROCHLOROTHIAZIDE 20-12.5 MG PO TABS: 1.0000 | ORAL_TABLET | Freq: Every day | ORAL | 1 refills | Status: DC

## 2023-07-07 NOTE — Patient Instructions (Addendum)
Constipation, Adult Constipation is when a person has fewer than three bowel movements in a week, has difficulty having a bowel movement, or has stools (feces) that are dry, hard, or larger than normal. Constipation may be caused by an underlying condition. It may become worse with age if a person takes certain medicines and does not take in enough fluids. Follow these instructions at home: Eating and drinking  Eat foods that have a lot of fiber, such as beans, whole grains, and fresh fruits and vegetables. Limit foods that are low in fiber and high in fat and processed sugars, such as fried or sweet foods. These include french fries, hamburgers, cookies, candies, and soda. Drink enough fluid to keep your urine pale yellow. General instructions Exercise regularly or as told by your health care provider. Try to do 150 minutes of moderate exercise each week. Use the bathroom when you have the urge to go. Do not hold it in. Take over-the-counter and prescription medicines only as told by your health care provider. This includes any fiber supplements. During bowel movements: Practice deep breathing while relaxing the lower abdomen. Practice pelvic floor relaxation. Watch your condition for any changes. Let your health care provider know about them. Keep all follow-up visits as told by your health care provider. This is important. Contact a health care provider if: You have pain that gets worse. You have a fever. You do not have a bowel movement after 4 days. You vomit. You are not hungry or you lose weight. You are bleeding from the opening between the buttocks (anus). You have thin, pencil-like stools. Get help right away if: You have a fever and your symptoms suddenly get worse. You leak stool or have blood in your stool. Your abdomen is bloated. You have severe pain in your abdomen. You feel dizzy or you faint. Summary Constipation is when a person has fewer than three bowel movements  in a week, has difficulty having a bowel movement, or has stools (feces) that are dry, hard, or larger than normal. Eat foods that have a lot of fiber, such as beans, whole grains, and fresh fruits and vegetables. Drink enough fluid to keep your urine pale yellow. Take over-the-counter and prescription medicines only as told by your health care provider. This includes any fiber supplements. This information is not intended to replace advice given to you by your health care provider. Make sure you discuss any questions you have with your health care provider.   Provider Comments 06/17/2023  7:47 PM [facility]  Referral -  Note: Sent Referral to Dr. Stephenie Acres   [address]  [phone] Fax # [phone]    Sent Referral to [facility] [place] [address] [place] [place] [address] [phone]   Document Revised: 06/25/2022 Document Reviewed: 06/25/2022 Elsevier Patient Education  2024 [employer].

## 2023-07-07 NOTE — Progress Notes (Signed)
Subjective:  Patient ID: [name], female    DOB: [birth year]  Age: 37 y.o. MRN: [id number]  CC: Establish Care (Miralax not working)   HPI [name] is a 37 y.o. year old female patient of Dr. Delford Field with a history of hypertension.  Interval History: Discussed the use of AI scribe software for clinical note transcription with the patient, who gave verbal consent to proceed.  She presents with constipation, moving her bowels 'once every four days.' She has tried Miralax without success and is interested in a daily medication for constipation. She does not consume a high fiber diet, which could contribute to her constipation.   She has been referred to rheumatology for joint pain and swelling, and her blood work showed a speckled ANA pattern 1: 320 and elevated C-reactive protein, suggesting an autoimmune condition.  She also reports a squint and a tooth that is breaking off at the top, and had requested referrals for ophthalmology and dentistry.  I had placed referral to the specialist but she is yet to hear from them. She endorses adherence with her antihypertensive.       Past Medical History:  Diagnosis Date   Anemia    Chronic hypertension during pregnancy, antepartum 12/24/2017   [x]  Aspirin 81 mg daily after 12 weeks; discontinue after 36 weeks Current antihypertensives:  None   Baseline and surveillance labs (pulled in from [facility], refresh links as needed)  Lab Results Component Value Date  PLT 353 11/25/2016  CREATININE 0.50 11/24/2017  AST 24 11/25/2016  ALT 17 11/25/2016  PROTCRRATIO 0.30 (H) 12/31/2015   Antenatal Testing CHTN - O10.919  Group I  BP < 140/90, no preecl   Hypertension    Pregnancy induced hypertension     Past Surgical History:  Procedure Laterality Date   TUBAL LIGATION Bilateral 07/20/2018   Procedure: POST PARTUM TUBAL LIGATION;  Surgeon: Levie Heritage, DO;  [facility]: [facility];  Service: Gynecology;  Laterality: Bilateral;     Family History  Problem Relation Age of Onset   Stroke Mother    Hypertension Mother    Hypertension Father     Social History   Socioeconomic History   Marital status: Single    Spouse name: Not on file   Number of children: Not on file   Years of education: Not on file   Highest education level: Not on file  Occupational History   Not on file  Tobacco Use   Smoking status: Former    Current packs/day: 0.00    Types: Cigarettes    Quit date: 03/29/2013    Years since quitting: 10.2   Smokeless tobacco: Never  Vaping Use   Vaping status: Never Used  Substance and Sexual Activity   Alcohol use: Yes    Comment: occ   Drug use: No   Sexual activity: Yes    Birth control/protection: Condom  Other Topics Concern   Not on file  Social History Narrative   Not on file   Social Determinants of Health   Financial Resource Strain: Low Risk  (07/07/2023)   Overall Financial Resource Strain (CARDIA)    Difficulty of Paying Living Expenses: Not very hard  Food Insecurity: No Food Insecurity (07/07/2023)   Hunger Vital Sign    Worried About Running Out of Food in the Last Year: Never true    Ran Out of Food in the Last Year: Never true  Transportation Needs: No Transportation Needs (07/07/2023)   PRAPARE -  Administrator, Civil Service (Medical): No    Lack of Transportation (Non-Medical): No  Physical Activity: Inactive (07/07/2023)   Exercise Vital Sign    Days of Exercise per Week: 0 days    Minutes of Exercise per Session: 0 min  Stress: No Stress Concern Present (07/07/2023)   [employer] of Occupational Health - Occupational Stress Questionnaire    Feeling of Stress : Not at all  Social Connections: Socially Isolated (07/07/2023)   Social Connection and Isolation Panel [NHANES]    Frequency of Communication with Friends and Family: Twice a week    Frequency of Social Gatherings with Friends and Family: Once a week    Attends Religious Services:  Never    Database administrator or Organizations: No    Attends Engineer, structural: Never    Marital Status: Never married    No Known Allergies  Outpatient Medications Prior to Visit  Medication Sig Dispense Refill   lisinopril-hydrochlorothiazide (ZESTORETIC) 20-12.5 MG tablet Take 1 tablet by mouth daily. 90 tablet 3   benzonatate (TESSALON) 100 MG capsule Take 1 capsule (100 mg total) by mouth 2 (two) times daily as needed for cough. (Patient not taking: Reported on 07/07/2023) 20 capsule 0   cetirizine (ZYRTEC) 10 MG tablet Take 1 tablet (10 mg total) by mouth daily. (Patient not taking: Reported on 07/07/2023) 30 tablet 1   diclofenac Sodium (VOLTAREN) 1 % GEL Apply 2 g topically 4 (four) times daily. (Patient not taking: Reported on 07/07/2023) 100 g 1   ferrous gluconate (FERGON) 324 MG tablet Take 1 tablet (324 mg total) by mouth 2 (two) times daily with a meal. (Patient not taking: Reported on 07/07/2023) 60 tablet 3   fluticasone (FLONASE) 50 MCG/ACT nasal spray Place 2 sprays into both nostrils daily. (Patient not taking: Reported on 07/07/2023) 16 g 6   furosemide (LASIX) 20 MG tablet Take 1 tablet (20 mg total) by mouth daily. (Patient not taking: Reported on 07/07/2023) 5 tablet 0   naproxen (NAPROSYN) 500 MG tablet TAKE 1 TABLET(500 MG) BY MOUTH TWICE DAILY AS NEEDED FOR PAIN (Patient not taking: Reported on 06/02/2023) 60 tablet 0   nystatin (MYCOSTATIN/NYSTOP) powder Apply 1 Application topically 3 (three) times daily. (Patient not taking: Reported on 06/02/2023) 15 g 0   predniSONE (DELTASONE) 20 MG tablet Take 1 tablet (20 mg total) by mouth daily with breakfast. (Patient not taking: Reported on 07/07/2023) 5 tablet 0   No facility-administered medications prior to visit.     ROS Review of Systems  Constitutional:  Negative for activity change and appetite change.  HENT:  Negative for sinus pressure and sore throat.   Respiratory:  Negative for chest  tightness, shortness of breath and wheezing.   Cardiovascular:  Negative for chest pain and palpitations.  Gastrointestinal:  Negative for abdominal distention, abdominal pain and constipation.  Genitourinary: Negative.   Musculoskeletal: Negative.   Psychiatric/Behavioral:  Negative for behavioral problems and dysphoric mood.     Objective:  BP 107/69   Pulse 77   Ht 5\' 2"  (1.575 m)   Wt 220 lb 12.8 oz (100.2 kg)   SpO2 100%   BMI 40.38 kg/m      07/07/2023    9:38 AM 06/02/2023    9:56 AM 05/29/2023    9:06 AM  BP/Weight  Systolic BP 107 100 102  Diastolic BP 69 67 66  Wt. (Lbs) 220.8 213.4   BMI 40.38 kg/m2 39.03 kg/m2  Physical Exam Constitutional:      Appearance: She is well-developed.  Cardiovascular:     Rate and Rhythm: Normal rate.     Heart sounds: Normal heart sounds. No murmur heard. Pulmonary:     Effort: Pulmonary effort is normal.     Breath sounds: Normal breath sounds. No wheezing or rales.  Chest:     Chest wall: No tenderness.  Abdominal:     General: Bowel sounds are normal. There is no distension.     Palpations: Abdomen is soft. There is no mass.     Tenderness: There is no abdominal tenderness.  Musculoskeletal:        General: Normal range of motion.     Right lower leg: No edema.     Left lower leg: No edema.  Neurological:     Mental Status: She is alert and oriented to person, place, and time.  Psychiatric:        Mood and Affect: Mood normal.        Latest Ref Rng & Units 05/29/2023    9:27 AM 09/30/2022    3:37 PM 06/24/2021    4:38 PM  CMP  Glucose 70 - 99 mg/dL 97  88  79   BUN 6 - 20 mg/dL 10  16  11    Creatinine 0.44 - 1.00 mg/dL 4.01  0.27  2.53   Sodium 135 - 145 mmol/L 137  138  136   Potassium 3.5 - 5.1 mmol/L 3.5  4.6  4.1   Chloride 98 - 111 mmol/L 101  101  98   CO2 22 - 32 mmol/L 22  22  22    Calcium 8.9 - 10.3 mg/dL 8.7  9.9  9.4   Total Protein 6.5 - 8.1 g/dL 7.4   7.6   Total Bilirubin 0.3 - 1.2 mg/dL  0.6   0.4   Alkaline Phos 38 - 126 U/L 55   56   AST 15 - 41 U/L 23   20   ALT 0 - 44 U/L 26   19     Lipid Panel  No results found for: "CHOL", "TRIG", "HDL", "CHOLHDL", "VLDL", "LDLCALC", "LDLDIRECT"  CBC    Component Value Date/Time   WBC 7.6 05/29/2023 0927   RBC 3.68 (L) 05/29/2023 0927   HGB 10.7 (L) 05/29/2023 0927   HGB 12.4 06/24/2021 1638   HCT 32.1 (L) 05/29/2023 0927   HCT 37.1 06/24/2021 1638   PLT 340 05/29/2023 0927   PLT 389 06/24/2021 1638   MCV 87.2 05/29/2023 0927   MCV 89 06/24/2021 1638   MCH 29.1 05/29/2023 0927   MCHC 33.3 05/29/2023 0927   RDW 13.2 05/29/2023 0927   RDW 12.1 06/24/2021 1638   LYMPHSABS 3.4 (H) 06/24/2021 1638   MONOABS 0.3 03/04/2021 1531   EOSABS 0.1 06/24/2021 1638   BASOSABS 0.0 06/24/2021 1638    Lab Results  Component Value Date   HGBA1C 4.7 (L) 06/24/2021    Assessment & Plan:      Chronic Constipation Infrequent bowel movements (once every four days) despite Miralax use. Discussed dietary modifications and the benefits of increasing fiber intake. Amitiza daily is preferred by insurance and will be prescribed. -Start Amitiza daily. -Consider dietary modifications to increase fiber intake.  Joint Pain and Swelling Ongoing symptoms with abnormal ANA and elevated C-reactive protein suggestive of an autoimmune condition. Rheumatology referral already in place. -Await rheumatology consultation for further evaluation and management.  Hypertension Well controlled on  Lisinopril and Hydrochlorothiazide. Recent labs in October were normal. -Continue current antihypertensive regimen.  Dental and Ophthalmology Referrals Patient reports difficulty finding providers who accept Medicaid. Referrals already placed. -Confirm receipt of referrals and await scheduling of appointments.  I have provided her with the names of the practices to which she was referred.         Meds ordered this encounter  Medications    lubiprostone (AMITIZA) 8 MCG capsule    Sig: Take 1 capsule (8 mcg total) by mouth 2 (two) times daily with a meal.    Dispense:  60 capsule    Refill:  3   lisinopril-hydrochlorothiazide (ZESTORETIC) 20-12.5 MG tablet    Sig: Take 1 tablet by mouth daily.    Dispense:  90 tablet    Refill:  1    Follow-up: Return in about 6 months (around 01/04/2024).       Hoy Register, MD, FAAFP. [facility] [place], [place] [phone]   07/07/2023, 12:48 PM

## 2023-07-08 ENCOUNTER — Encounter: Payer: Self-pay | Admitting: Pharmacist

## 2023-07-08 ENCOUNTER — Other Ambulatory Visit: Payer: Self-pay

## 2023-07-10 ENCOUNTER — Other Ambulatory Visit: Payer: Self-pay

## 2023-07-13 ENCOUNTER — Other Ambulatory Visit: Payer: Self-pay

## 2023-08-08 ENCOUNTER — Encounter: Payer: Self-pay | Admitting: Family Medicine

## 2023-08-10 ENCOUNTER — Other Ambulatory Visit: Payer: Self-pay | Admitting: Family Medicine

## 2023-08-10 MED ORDER — MECLIZINE HCL 25 MG PO TABS: 25.0000 mg | ORAL_TABLET | Freq: Three times a day (TID) | ORAL | 1 refills | Status: DC | PRN

## 2023-08-10 MED ORDER — PREDNISONE 20 MG PO TABS: 20.0000 mg | ORAL_TABLET | Freq: Every day | ORAL | 0 refills | Status: DC

## 2023-08-21 ENCOUNTER — Encounter: Payer: Self-pay | Admitting: Internal Medicine

## 2023-08-21 ENCOUNTER — Other Ambulatory Visit (HOSPITAL_COMMUNITY)
Admission: RE | Admit: 2023-08-21 | Discharge: 2023-08-21 | Disposition: A | Discharge status: Home / Self Care | Payer: Medicaid Other | Source: Ambulatory Visit | Attending: Internal Medicine | Admitting: Internal Medicine

## 2023-08-21 ENCOUNTER — Ambulatory Visit: Payer: Medicaid Other | Attending: Internal Medicine | Admitting: Internal Medicine

## 2023-08-21 VITALS — BP 134/75 | HR 98 | Temp 98.3°F | Ht 62.0 in | Wt 226.0 lb

## 2023-08-21 DIAGNOSIS — L304 Erythema intertrigo: Secondary | ICD-10-CM | POA: Diagnosis not present

## 2023-08-21 DIAGNOSIS — N898 Other specified noninflammatory disorders of vagina: Secondary | ICD-10-CM | POA: Diagnosis not present

## 2023-08-21 MED ORDER — NYSTATIN 100000 UNIT/GM EX CREA
TOPICAL_CREAM | CUTANEOUS | 2 refills | Status: DC
Start: 2023-08-21 — End: 2024-05-10

## 2023-08-21 NOTE — Progress Notes (Signed)
Patient ID: [name], female    DOB: 23-Oct-1985  MRN: [id number]  CC: Vaginal Discharge (Vaginal d/c X2 mo - requesting nystatin to be changed to a cream )   Subjective: [name] is a 37 y.o. female who presents for UC visit.  PCP is Dr. Alvis Lemmings Her concerns today include:  Pt with hx of HTN, Anemia, obesity  Pt c/o vaginal dischg on and off for 2 mths.  Sometimes white other times yellow.  No vaginal irritation.  Request Nystatin cream instead of powder to use for intertrigo under breast and abdominal fold that she gets intermittently.  Does not have it now.   Patient Active Problem List   Diagnosis Date Noted   Administrative encounter 01/01/2023   Severe obesity (BMI >= 40) (HCC) 09/30/2022   Patellar tendinitis of right knee 09/26/2022   Vagina, candidiasis 05/15/2022   BMI 38.0-38.9,adult 06/24/2021   Anemia 03/20/2021   Hypertension      Current Outpatient Medications on File Prior to Visit  Medication Sig Dispense Refill   lisinopril-hydrochlorothiazide (ZESTORETIC) 20-12.5 MG tablet Take 1 tablet by mouth daily. 90 tablet 1   meclizine (ANTIVERT) 25 MG tablet Take 1 tablet (25 mg total) by mouth 3 (three) times daily as needed for dizziness. 60 tablet 1   predniSONE (DELTASONE) 20 MG tablet Take 1 tablet (20 mg total) by mouth daily with breakfast. 5 tablet 0   benzonatate (TESSALON) 100 MG capsule Take 1 capsule (100 mg total) by mouth 2 (two) times daily as needed for cough. (Patient not taking: Reported on 08/21/2023) 20 capsule 0   cetirizine (ZYRTEC) 10 MG tablet Take 1 tablet (10 mg total) by mouth daily. (Patient not taking: Reported on 08/21/2023) 30 tablet 1   diclofenac Sodium (VOLTAREN) 1 % GEL Apply 2 g topically 4 (four) times daily. (Patient not taking: Reported on 08/21/2023) 100 g 1   ferrous gluconate (FERGON) 324 MG tablet Take 1 tablet (324 mg total) by mouth 2 (two) times daily with a meal. (Patient not taking: Reported on 08/21/2023) 60  tablet 3   fluticasone (FLONASE) 50 MCG/ACT nasal spray Place 2 sprays into both nostrils daily. (Patient not taking: Reported on 08/21/2023) 16 g 6   furosemide (LASIX) 20 MG tablet Take 1 tablet (20 mg total) by mouth daily. (Patient not taking: Reported on 08/21/2023) 5 tablet 0   lubiprostone (AMITIZA) 8 MCG capsule Take 1 capsule (8 mcg total) by mouth 2 (two) times daily with a meal. (Patient not taking: Reported on 08/21/2023) 60 capsule 3   naproxen (NAPROSYN) 500 MG tablet TAKE 1 TABLET(500 MG) BY MOUTH TWICE DAILY AS NEEDED FOR PAIN (Patient not taking: Reported on 08/21/2023) 60 tablet 0   No current facility-administered medications on file prior to visit.    No Known Allergies  Social History   Socioeconomic History   Marital status: Single    Spouse name: Not on file   Number of children: Not on file   Years of education: Not on file   Highest education level: Not on file  Occupational History   Not on file  Tobacco Use   Smoking status: Former    Current packs/day: 0.00    Types: Cigarettes    Quit date: 03/29/2013    Years since quitting: 10.4   Smokeless tobacco: Never  Vaping Use   Vaping status: Never Used  Substance and Sexual Activity   Alcohol use: Yes    Comment: occ   Drug  use: No   Sexual activity: Yes    Birth control/protection: Condom  Other Topics Concern   Not on file  Social History Narrative   Not on file   Social Drivers of Health   Financial Resource Strain: Low Risk  (07/07/2023)   Overall Financial Resource Strain (CARDIA)    Difficulty of Paying Living Expenses: Not very hard  Food Insecurity: No Food Insecurity (07/07/2023)   Hunger Vital Sign    Worried About Running Out of Food in the Last Year: Never true    Ran Out of Food in the Last Year: Never true  Transportation Needs: No Transportation Needs (07/07/2023)   PRAPARE - Administrator, Civil Service (Medical): No    Lack of Transportation (Non-Medical): No   Physical Activity: Inactive (07/07/2023)   Exercise Vital Sign    Days of Exercise per Week: 0 days    Minutes of Exercise per Session: 0 min  Stress: No Stress Concern Present (07/07/2023)   [employer] of Occupational Health - Occupational Stress Questionnaire    Feeling of Stress : Not at all  Social Connections: Socially Isolated (07/07/2023)   Social Connection and Isolation Panel [NHANES]    Frequency of Communication with Friends and Family: Twice a week    Frequency of Social Gatherings with Friends and Family: Once a week    Attends Religious Services: Never    Database administrator or Organizations: No    Attends Banker Meetings: Never    Marital Status: Never married  Intimate Partner Violence: Not At Risk (07/07/2023)   Humiliation, Afraid, Rape, and Kick questionnaire    Fear of Current or Ex-Partner: No    Emotionally Abused: No    Physically Abused: No    Sexually Abused: No    Family History  Problem Relation Age of Onset   Stroke Mother    Hypertension Mother    Hypertension Father     Past Surgical History:  Procedure Laterality Date   TUBAL LIGATION Bilateral 07/20/2018   Procedure: POST PARTUM TUBAL LIGATION;  Surgeon: Levie Heritage, DO;  Location: [facility];  Service: Gynecology;  Laterality: Bilateral;    ROS: Review of Systems Negative except as stated above  PHYSICAL EXAM: BP 134/75 (BP Location: Left Arm, Patient Position: Sitting, Cuff Size: Normal)   Pulse 98   Temp 98.3 F (36.8 C) (Oral)   Ht 5\' 2"  (1.575 m)   Wt 226 lb (102.5 kg)   SpO2 97%   BMI 41.34 kg/m   Physical Exam  General appearance - alert, well appearing, middle-age African-American female and in no distress Mental status - normal mood, behavior, speech, dress, motor activity, and thought processes      Latest Ref Rng & Units 05/29/2023    9:27 AM 09/30/2022    3:37 PM 06/24/2021    4:38 PM  CMP  Glucose 70 - 99 mg/dL 97  88  79    BUN 6 - 20 mg/dL 10  16  11    Creatinine 0.44 - 1.00 mg/dL 5.36  6.44  0.34   Sodium 135 - 145 mmol/L 137  138  136   Potassium 3.5 - 5.1 mmol/L 3.5  4.6  4.1   Chloride 98 - 111 mmol/L 101  101  98   CO2 22 - 32 mmol/L 22  22  22    Calcium 8.9 - 10.3 mg/dL 8.7  9.9  9.4   Total Protein 6.5 - 8.1  g/dL 7.4   7.6   Total Bilirubin 0.3 - 1.2 mg/dL 0.6   0.4   Alkaline Phos 38 - 126 U/L 55   56   AST 15 - 41 U/L 23   20   ALT 0 - 44 U/L 26   19    Lipid Panel  No results found for: "CHOL", "TRIG", "HDL", "CHOLHDL", "VLDL", "LDLCALC", "LDLDIRECT"  CBC    Component Value Date/Time   WBC 7.6 05/29/2023 0927   RBC 3.68 (L) 05/29/2023 0927   HGB 10.7 (L) 05/29/2023 0927   HGB 12.4 06/24/2021 1638   HCT 32.1 (L) 05/29/2023 0927   HCT 37.1 06/24/2021 1638   PLT 340 05/29/2023 0927   PLT 389 06/24/2021 1638   MCV 87.2 05/29/2023 0927   MCV 89 06/24/2021 1638   MCH 29.1 05/29/2023 0927   MCHC 33.3 05/29/2023 0927   RDW 13.2 05/29/2023 0927   RDW 12.1 06/24/2021 1638   LYMPHSABS 3.4 (H) 06/24/2021 1638   MONOABS 0.3 03/04/2021 1531   EOSABS 0.1 06/24/2021 1638   BASOSABS 0.0 06/24/2021 1638    ASSESSMENT AND PLAN:  1. Vaginal discharge (Primary) - Cervicovaginal ancillary only  2. Intertrigo Advised to keep the skin under the breast and abdominal fold clean and dry.  Prescription sent for nystatin cream. - nystatin cream (MYCOSTATIN); Apply to affected area twice a day  Dispense: 30 g; Refill: 2   Patient was given the opportunity to ask questions.  Patient verbalized understanding of the plan and was able to repeat key elements of the plan.   This documentation was completed using Paediatric nurse.  Any transcriptional errors are unintentional.  No orders of the defined types were placed in this encounter.    Requested Prescriptions   Signed Prescriptions Disp Refills   nystatin cream (MYCOSTATIN) 30 g 2    Sig: Apply to affected area twice a day     No follow-ups on file.  Jonah Blue, MD, FACP

## 2023-08-24 ENCOUNTER — Other Ambulatory Visit: Payer: Self-pay | Admitting: Family Medicine

## 2023-08-24 LAB — CERVICOVAGINAL ANCILLARY ONLY
Bacterial Vaginitis (gardnerella): POSITIVE — AB
Candida Glabrata: NEGATIVE
Candida Vaginitis: NEGATIVE
Chlamydia: NEGATIVE
Comment: NEGATIVE
Comment: NEGATIVE
Comment: NEGATIVE
Comment: NEGATIVE
Comment: NEGATIVE
Comment: NORMAL
Neisseria Gonorrhea: NEGATIVE
Trichomonas: NEGATIVE

## 2023-08-24 MED ORDER — METRONIDAZOLE 500 MG PO TABS: 500.0000 mg | ORAL_TABLET | Freq: Two times a day (BID) | ORAL | 0 refills | Status: AC

## 2023-08-25 ENCOUNTER — Ambulatory Visit: Payer: Self-pay

## 2023-08-25 DIAGNOSIS — M2550 Pain in unspecified joint: Secondary | ICD-10-CM | POA: Diagnosis not present

## 2023-08-25 DIAGNOSIS — G8929 Other chronic pain: Secondary | ICD-10-CM | POA: Diagnosis not present

## 2023-08-25 DIAGNOSIS — M25522 Pain in left elbow: Secondary | ICD-10-CM | POA: Diagnosis not present

## 2023-08-25 DIAGNOSIS — R768 Other specified abnormal immunological findings in serum: Secondary | ICD-10-CM | POA: Diagnosis not present

## 2023-08-25 DIAGNOSIS — M79671 Pain in right foot: Secondary | ICD-10-CM | POA: Diagnosis not present

## 2023-08-25 NOTE — Telephone Encounter (Signed)
 Chief Complaint: Arm swelling Symptoms: Swelling near elbow and pain 8/10 Frequency: constant onset yesterday  Pertinent Negatives: Patient denies fever, injury,  Disposition: [] ED /[] Urgent Care (no appt availability in office) / [] Appointment(In office/virtual)/ []  [place]/ [] Home Care/ [] Refused Recommended Disposition /[] [facility] Mobile Bus/ [x]  Follow-up with PCP Additional Notes: Patient states she was seen by the Rheumatologist at [facility] yesterday and lab work was done.  Patient states she continues to have mild swelling near her elbow and the pain is 8/10 today. Patient stated the provider told her he should have her lab results in about 1 week. 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 Care advice was given and no appointments are available in office. Mobile bus services offered but patient does not believe she can make it to the location in time. Patient is request recommendations or work note from PCP at this time. Advised I would forward message to PCP for recommendations. Reason for Disposition  [1] Small area of localized swelling AND [2] not better after 3 days  Answer Assessment - Initial Assessment Questions 1. ONSET: When did the swelling start? (e.g., minutes, hours, days)     Ongoing flare up started yesterday  2. LOCATION: What part of the arm is swollen?  Are both arms swollen or just one arm?     Near the elbow  3. SEVERITY: How bad is the swelling? (e.g., localized; mild, moderate, severe)   - LOCALIZED: Small area of puffiness or swelling on just one arm   - JOINT SWELLING: Swelling of one joint   - MILD: Puffiness or swelling of hand   - MODERATE: Puffiness or swollen feeling of entire arm    - SEVERE: All of arm looks swollen; pitting edema     Mild  4. REDNESS: Does the swelling look red or infected?     No  5. PAIN: Is the swelling painful  to touch? If Yes, ask: How painful is it?   (Scale 1-10; mild, moderate or severe)     8/10 6. FEVER: Do you have a fever? If Yes, ask: What is it, how was it measured, and when did it start?      No  7. CAUSE: What do you think is causing the arm swelling?     I don't know  8. MEDICAL HISTORY: Do you have a history of heart failure, kidney disease, liver failure, or cancer?     No  9. RECURRENT SYMPTOM: Have you had arm swelling before? If Yes, ask: When was the last time? What happened that time?     Yes, I was seen yesterday By Dr. Campbell Spitz rheumatologist  10. OTHER SYMPTOMS: Do you have any other symptoms? (e.g., chest pain, difficulty breathing)       No  Protocols used: Arm Swelling and Edema-A-AH

## 2023-08-25 NOTE — Telephone Encounter (Signed)
 Spoke with patient . Patient advised to contact Rheumatologist. Advised that since she just had a visit with them and labs were obtained the Rheumatologist would be able to advise her next steps. Patient is in agreement and will call Rheumatologist  today.

## 2023-09-18 ENCOUNTER — Other Ambulatory Visit: Payer: Self-pay | Admitting: Family Medicine

## 2023-10-27 NOTE — Progress Notes (Deleted)
 Office Visit Note  Patient: [name]             Date of Birth: 1986/03/13           MRN: [id number]             PCP: Hoy Register, MD Referring: Hoy Register, MD Visit Date: 11/10/2023 Occupation: @GUAROCC @  Subjective:  No chief complaint on file.   History of Present Illness: [name] is a 38 y.o. female ***     Activities of Daily Living:  Patient reports morning stiffness for *** {minute/hour:19697}.   Patient {ACTIONS;DENIES/REPORTS:21021675::"Denies"} nocturnal pain.  Difficulty dressing/grooming: {ACTIONS;DENIES/REPORTS:21021675::"Denies"} Difficulty climbing stairs: {ACTIONS;DENIES/REPORTS:21021675::"Denies"} Difficulty getting out of chair: {ACTIONS;DENIES/REPORTS:21021675::"Denies"} Difficulty using hands for taps, buttons, cutlery, and/or writing: {ACTIONS;DENIES/REPORTS:21021675::"Denies"}  No Rheumatology ROS completed.   PMFS History:  Patient Active Problem List   Diagnosis Date Noted   Administrative encounter 01/01/2023   Severe obesity (BMI >= 40) (HCC) 09/30/2022   Patellar tendinitis of right knee 09/26/2022   Vagina, candidiasis 05/15/2022   BMI 38.0-38.9,adult 06/24/2021   Anemia 03/20/2021   Hypertension     Past Medical History:  Diagnosis Date   Anemia    Chronic hypertension during pregnancy, antepartum 12/24/2017   [x]  Aspirin 81 mg daily after 12 weeks; discontinue after 36 weeks Current antihypertensives:  None   Baseline and surveillance labs (pulled in from [facility], refresh links as needed)  Lab Results Component Value Date  PLT 353 11/25/2016  CREATININE 0.50 11/24/2017  AST 24 11/25/2016  ALT 17 11/25/2016  PROTCRRATIO 0.30 (H) 12/31/2015   Antenatal Testing CHTN - O10.919  Group I  BP < 140/90, no preecl   Hypertension    Pregnancy induced hypertension     Family History  Problem Relation Age of Onset   Stroke Mother    Hypertension Mother    Hypertension Father    Past Surgical History:  Procedure Laterality  Date   TUBAL LIGATION Bilateral 07/20/2018   Procedure: POST PARTUM TUBAL LIGATION;  Surgeon: Levie Heritage, DO;  Location: [facility];  Service: Gynecology;  Laterality: Bilateral;   Social History   Social History Narrative   Not on file   Immunization History  Administered Date(s) Administered   HPV 9-valent 03/26/2022, 05/13/2022, 09/25/2022   PFIZER(Purple Top)SARS-COV-2 Vaccination 04/03/2020, 05/16/2020   PPD Test 09/30/2017   Tdap 01/02/2016, 05/06/2018     Objective: Vital Signs: There were no vitals taken for this visit.   Physical Exam   Musculoskeletal Exam: ***  CDAI Exam: CDAI Score: -- Patient Global: --; Provider Global: -- Swollen: --; Tender: -- Joint Exam 11/10/2023   No joint exam has been documented for this visit   There is currently no information documented on the homunculus. Go to the Rheumatology activity and complete the homunculus joint exam.  Investigation: No additional findings.  Imaging: No results found.  Recent Labs: Lab Results  Component Value Date   WBC 7.6 05/29/2023   HGB 10.7 (L) 05/29/2023   PLT 340 05/29/2023   NA 137 05/29/2023   K 3.5 05/29/2023   CL 101 05/29/2023   CO2 22 05/29/2023   GLUCOSE 97 05/29/2023   BUN 10 05/29/2023   CREATININE 0.69 05/29/2023   BILITOT 0.6 05/29/2023   ALKPHOS 55 05/29/2023   AST 23 05/29/2023   ALT 26 05/29/2023   PROT 7.4 05/29/2023   ALBUMIN 3.7 05/29/2023   CALCIUM 8.7 (L) 05/29/2023   GFRAA >60 05/01/2020    Speciality Comments:  No specialty comments available.  Procedures:  No procedures performed Allergies: Patient has no known allergies.   Assessment / Plan:     Visit Diagnoses: Positive ANA (antinuclear antibody) - 06/02/23: ANA 1:320 speckled, RF<10, anti-CCP 6, ESR WNL, CRP 40, Sm-, dsDNA 1, Pro BNP WNL, vitamin B12 473  Swelling - "Intermittent swelling in different body parts"  Patellar tendinitis of right knee  Primary hypertension  Vagina,  candidiasis  Orders: No orders of the defined types were placed in this encounter.  No orders of the defined types were placed in this encounter.   Face-to-face time spent with patient was *** minutes. Greater than 50% of time was spent in counseling and coordination of care.  Follow-Up Instructions: No follow-ups on file.   Gearldine Bienenstock, PA-C  Note - This record has been created using Dragon software.  Chart creation errors have been sought, but may not always  have been located. Such creation errors do not reflect on  the standard of medical care.

## 2023-11-10 ENCOUNTER — Encounter: Payer: Medicaid Other | Admitting: Rheumatology

## 2023-11-10 DIAGNOSIS — B3731 Acute candidiasis of vulva and vagina: Secondary | ICD-10-CM

## 2023-11-10 DIAGNOSIS — R768 Other specified abnormal immunological findings in serum: Secondary | ICD-10-CM

## 2023-11-10 DIAGNOSIS — M7651 Patellar tendinitis, right knee: Secondary | ICD-10-CM

## 2023-11-10 DIAGNOSIS — R609 Edema, unspecified: Secondary | ICD-10-CM

## 2023-11-10 DIAGNOSIS — I10 Essential (primary) hypertension: Secondary | ICD-10-CM

## 2023-11-18 ENCOUNTER — Encounter: Payer: Self-pay | Admitting: Family Medicine

## 2023-11-18 ENCOUNTER — Ambulatory Visit: Attending: Family Medicine | Admitting: Family Medicine

## 2023-11-18 VITALS — BP 109/72 | HR 73 | Ht 62.0 in | Wt 229.2 lb

## 2023-11-18 DIAGNOSIS — R7303 Prediabetes: Secondary | ICD-10-CM

## 2023-11-18 DIAGNOSIS — M25511 Pain in right shoulder: Secondary | ICD-10-CM | POA: Diagnosis not present

## 2023-11-18 DIAGNOSIS — J3089 Other allergic rhinitis: Secondary | ICD-10-CM | POA: Diagnosis not present

## 2023-11-18 DIAGNOSIS — I10 Essential (primary) hypertension: Secondary | ICD-10-CM | POA: Diagnosis not present

## 2023-11-18 DIAGNOSIS — G8929 Other chronic pain: Secondary | ICD-10-CM | POA: Diagnosis not present

## 2023-11-18 DIAGNOSIS — R768 Other specified abnormal immunological findings in serum: Secondary | ICD-10-CM | POA: Diagnosis not present

## 2023-11-18 DIAGNOSIS — R7689 Other specified abnormal immunological findings in serum: Secondary | ICD-10-CM

## 2023-11-18 LAB — POCT GLYCOSYLATED HEMOGLOBIN (HGB A1C): Hemoglobin A1C: 5.8 % — AB (ref 4.0–5.6)

## 2023-11-18 MED ORDER — CETIRIZINE HCL 10 MG PO TABS: 10.0000 mg | ORAL_TABLET | Freq: Every day | ORAL | 1 refills | Status: DC

## 2023-11-18 MED ORDER — LISINOPRIL-HYDROCHLOROTHIAZIDE 20-12.5 MG PO TABS: 1.0000 | ORAL_TABLET | Freq: Every day | ORAL | 1 refills | Status: DC

## 2023-11-18 MED ORDER — FLUTICASONE PROPIONATE 50 MCG/ACT NA SUSP: 2.0000 | Freq: Every day | NASAL | 6 refills | Status: AC

## 2023-11-18 MED ORDER — MECLIZINE HCL 25 MG PO TABS: 25.0000 mg | ORAL_TABLET | Freq: Three times a day (TID) | ORAL | 1 refills | Status: DC | PRN

## 2023-11-18 MED ORDER — MELOXICAM 7.5 MG PO TABS
7.5000 mg | ORAL_TABLET | Freq: Every day | ORAL | 1 refills | Status: DC
Start: 2023-11-18 — End: 2024-05-10

## 2023-11-18 NOTE — Patient Instructions (Signed)
 VISIT SUMMARY:  During today's visit, we discussed your ongoing right shoulder pain, joint pain and swelling, hypertension, and weight management goals. We also reviewed your general health maintenance needs.  YOUR PLAN:  -RIGHT SHOULDER PAIN: You have pain in your right shoulder that worsens with lifting your arm and overhead motions. This is likely due to musculoskeletal issues. We will refer you to physical therapy to help manage the pain and prescribe meloxicam to relieve the joint pain.  -JOINT PAIN AND SWELLING: You have persistent joint pain and swelling, with a history of elevated ANA and uric acid levels. Although your previous rheumatology evaluation was normal except for high uric acid, we will review the notes and lab results and refer you to another rheumatologist for a second opinion.  -HYPERTENSION: Your blood pressure needs to be managed continuously. We will refill your prescription for lisinopril-hydrochlorothiazide to help control your blood pressure.  -WEIGHT MANAGEMENT: You are interested in losing weight and have concerns about fluid retention. We discussed dietary changes and exercise, and we will check your A1c level to assess for diabetes and determine if you are eligible for certain medications. If your A1c is normal, we will refer you to a weight management clinic.  -GENERAL HEALTH MAINTENANCE: Routine health maintenance is important. We will order a cholesterol test and refill your prescriptions for Flonase for allergies and meclizine for vertigo.  INSTRUCTIONS:  Please follow up on the referrals and test results as discussed. We will provide you with referral information for a dentist and eye doctor in your discharge summary. Additionally, we will follow up on the rheumatology notes and send you a message with the findings. A referral to another rheumatologist will also be placed.

## 2023-11-18 NOTE — Progress Notes (Signed)
 Subjective:  Patient ID: [name], female    DOB: 1985-10-10  Age: 38 y.o. MRN: [id number]  CC: Shoulder Pain (Discuss weight loss)     Discussed the use of AI scribe software for clinical note transcription with the patient, who gave verbal consent to proceed.  History of Present Illness The patient, with a history of hypertension, joint pains and swelling, presents with right shoulder pain that has been ongoing for about a month. The pain is located in the anterior aspect of the shoulder and radiates upwards. The pain occurs even at rest and is not associated with heavy lifting. The patient reports a popping sensation in the shoulder and elbow when lifting the arm. The patient also reports that the pain and swelling in the joints seem to respond to prednisone.  The patient was referred to a rheumatologist due to an abnormal ANA result with speckled pattern of 1:320. The patient reports that all tests conducted by the rheumatologist were normal except for a high uric acid level. The patient has not been given a follow-up appointment with the rheumatologist.  The patient also expresses a desire to lose weight and has concerns about potential fluid retention. The patient reports occasional swelling in the ankles but is unsure of the cause. The patient is interested in information on weight loss or weight loss medication. Endorses adherence with her antihypertensive.   Past Medical History:  Diagnosis Date   Anemia    Chronic hypertension during pregnancy, antepartum 12/24/2017   [x]  Aspirin 81 mg daily after 12 weeks; discontinue after 36 weeks Current antihypertensives:  None   Baseline and surveillance labs (pulled in from [facility], refresh links as needed)  Lab Results Component Value Date  PLT 353 11/25/2016  CREATININE 0.50 11/24/2017  AST 24 11/25/2016  ALT 17 11/25/2016  PROTCRRATIO 0.30 (H) 12/31/2015   Antenatal Testing CHTN - O10.919  Group I  BP < 140/90, no preecl    Hypertension    Pregnancy induced hypertension     Past Surgical History:  Procedure Laterality Date   TUBAL LIGATION Bilateral 07/20/2018   Procedure: POST PARTUM TUBAL LIGATION;  Surgeon: Levie Heritage, DO;  Location: [facility];  Service: Gynecology;  Laterality: Bilateral;    Family History  Problem Relation Age of Onset   Stroke Mother    Hypertension Mother    Hypertension Father     Social History   Socioeconomic History   Marital status: Single    Spouse name: Not on file   Number of children: Not on file   Years of education: Not on file   Highest education level: Not on file  Occupational History   Not on file  Tobacco Use   Smoking status: Former    Current packs/day: 0.00    Types: Cigarettes    Quit date: 03/29/2013    Years since quitting: 10.6   Smokeless tobacco: Never  Vaping Use   Vaping status: Never Used  Substance and Sexual Activity   Alcohol use: Yes    Comment: occ   Drug use: No   Sexual activity: Yes    Birth control/protection: Condom  Other Topics Concern   Not on file  Social History Narrative   Not on file   Social Drivers of Health   Financial Resource Strain: Low Risk  (11/18/2023)   Overall Financial Resource Strain (CARDIA)    Difficulty of Paying Living Expenses: Not hard at all  Food Insecurity: No Food Insecurity (11/18/2023)  Hunger Vital Sign    Worried About Running Out of Food in the Last Year: Never true    Ran Out of Food in the Last Year: Never true  Transportation Needs: No Transportation Needs (11/18/2023)   PRAPARE - Administrator, Civil Service (Medical): No    Lack of Transportation (Non-Medical): No  Physical Activity: Inactive (11/18/2023)   Exercise Vital Sign    Days of Exercise per Week: 0 days    Minutes of Exercise per Session: 0 min  Stress: No Stress Concern Present (11/18/2023)   [employer] of Occupational Health - Occupational Stress Questionnaire    Feeling of Stress  : Not at all  Social Connections: Socially Isolated (11/18/2023)   Social Connection and Isolation Panel [NHANES]    Frequency of Communication with Friends and Family: Three times a week    Frequency of Social Gatherings with Friends and Family: Once a week    Attends Religious Services: Never    Database administrator or Organizations: No    Attends Engineer, structural: Never    Marital Status: Never married    No Known Allergies  Outpatient Medications Prior to Visit  Medication Sig Dispense Refill   ferrous gluconate (FERGON) 324 MG tablet Take 1 tablet (324 mg total) by mouth 2 (two) times daily with a meal. 60 tablet 3   lubiprostone (AMITIZA) 8 MCG capsule Take 1 capsule (8 mcg total) by mouth 2 (two) times daily with a meal. 60 capsule 3   nystatin cream (MYCOSTATIN) Apply to affected area twice a day 30 g 2   cetirizine (ZYRTEC) 10 MG tablet Take 1 tablet (10 mg total) by mouth daily. 30 tablet 1   diclofenac Sodium (VOLTAREN) 1 % GEL Apply 2 g topically 4 (four) times daily. 100 g 1   fluticasone (FLONASE) 50 MCG/ACT nasal spray Place 2 sprays into both nostrils daily. 16 g 6   lisinopril-hydrochlorothiazide (ZESTORETIC) 20-12.5 MG tablet Take 1 tablet by mouth daily. 90 tablet 1   meclizine (ANTIVERT) 25 MG tablet Take 1 tablet (25 mg total) by mouth 3 (three) times daily as needed for dizziness. 60 tablet 1   benzonatate (TESSALON) 100 MG capsule Take 1 capsule (100 mg total) by mouth 2 (two) times daily as needed for cough. (Patient not taking: Reported on 07/07/2023) 20 capsule 0   furosemide (LASIX) 20 MG tablet TAKE 1 TABLET(20 MG) BY MOUTH DAILY (Patient not taking: Reported on 11/18/2023) 5 tablet 0   predniSONE (DELTASONE) 20 MG tablet Take 1 tablet (20 mg total) by mouth daily with breakfast. (Patient not taking: Reported on 11/18/2023) 5 tablet 0   naproxen (NAPROSYN) 500 MG tablet TAKE 1 TABLET(500 MG) BY MOUTH TWICE DAILY AS NEEDED FOR PAIN (Patient not taking:  Reported on 11/18/2023) 60 tablet 0   No facility-administered medications prior to visit.     ROS Review of Systems  Constitutional:  Negative for activity change and appetite change.  HENT:  Negative for sinus pressure and sore throat.   Respiratory:  Negative for chest tightness, shortness of breath and wheezing.   Cardiovascular:  Negative for chest pain and palpitations.  Gastrointestinal:  Negative for abdominal distention, abdominal pain and constipation.  Genitourinary: Negative.   Musculoskeletal:        See HPI  Psychiatric/Behavioral:  Negative for behavioral problems and dysphoric mood.     Objective:  BP 109/72   Pulse 73   Ht 5\' 2"  (1.575 m)  Wt 229 lb 3.2 oz (104 kg)   SpO2 97%   BMI 41.92 kg/m      11/18/2023    9:48 AM 08/21/2023    2:25 PM 07/07/2023    9:38 AM  BP/Weight  Systolic BP 109 134 107  Diastolic BP 72 75 69  Wt. (Lbs) 229.2 226 220.8  BMI 41.92 kg/m2 41.34 kg/m2 40.38 kg/m2      Physical Exam Constitutional:      Appearance: She is well-developed.  Cardiovascular:     Rate and Rhythm: Normal rate.     Heart sounds: Normal heart sounds. No murmur heard. Pulmonary:     Effort: Pulmonary effort is normal.     Breath sounds: Normal breath sounds. No wheezing or rales.  Chest:     Chest wall: No tenderness.  Abdominal:     General: Bowel sounds are normal. There is no distension.     Palpations: Abdomen is soft. There is no mass.     Tenderness: There is no abdominal tenderness.  Musculoskeletal:     Right lower leg: No edema.     Left lower leg: No edema.     Comments: Tenderness in right anterior shoulder and on range of motion.  Negative Hawkins or Neer signs Left shoulder is normal  Neurological:     Mental Status: She is alert and oriented to person, place, and time.  Psychiatric:        Mood and Affect: Mood normal.        Latest Ref Rng & Units 05/29/2023    9:27 AM 09/30/2022    3:37 PM 06/24/2021    4:38 PM  CMP   Glucose 70 - 99 mg/dL 97  88  79   BUN 6 - 20 mg/dL 10  16  11    Creatinine 0.44 - 1.00 mg/dL 8.65  7.84  6.96   Sodium 135 - 145 mmol/L 137  138  136   Potassium 3.5 - 5.1 mmol/L 3.5  4.6  4.1   Chloride 98 - 111 mmol/L 101  101  98   CO2 22 - 32 mmol/L 22  22  22    Calcium 8.9 - 10.3 mg/dL 8.7  9.9  9.4   Total Protein 6.5 - 8.1 g/dL 7.4   7.6   Total Bilirubin 0.3 - 1.2 mg/dL 0.6   0.4   Alkaline Phos 38 - 126 U/L 55   56   AST 15 - 41 U/L 23   20   ALT 0 - 44 U/L 26   19     Lipid Panel  No results found for: "CHOL", "TRIG", "HDL", "CHOLHDL", "VLDL", "LDLCALC", "LDLDIRECT"  CBC    Component Value Date/Time   WBC 7.6 05/29/2023 0927   RBC 3.68 (L) 05/29/2023 0927   HGB 10.7 (L) 05/29/2023 0927   HGB 12.4 06/24/2021 1638   HCT 32.1 (L) 05/29/2023 0927   HCT 37.1 06/24/2021 1638   PLT 340 05/29/2023 0927   PLT 389 06/24/2021 1638   MCV 87.2 05/29/2023 0927   MCV 89 06/24/2021 1638   MCH 29.1 05/29/2023 0927   MCHC 33.3 05/29/2023 0927   RDW 13.2 05/29/2023 0927   RDW 12.1 06/24/2021 1638   LYMPHSABS 3.4 (H) 06/24/2021 1638   MONOABS 0.3 03/04/2021 1531   EOSABS 0.1 06/24/2021 1638   BASOSABS 0.0 06/24/2021 1638    Lab Results  Component Value Date   HGBA1C 5.8 (A) 11/18/2023       Assessment &  Plan Right Shoulder Pain Suspected musculoskeletal pain exacerbated by arm lifting and overhead motions. - Refer to physical therapy for shoulder pain management. - Prescribe meloxicam for joint pain relief.  Elevated ANA Joint Pain and Swelling which typically resolves with prednisone Persistent joint pain and swelling with elevated ANA and uric acid levels. Previous rheumatology evaluation normal except high uric acid. Consider second rheumatology opinion. - Review rheumatology notes and lab results. - Refer to another rheumatologist for a second opinion.  Hypertension Requires continuation of current antihypertensive management. - Refill  lisinopril-hydrochlorothiazide prescription.  Morbid obesity Desires weight loss. Discussed dietary changes and exercise.  -A1c is 5.8-prediabetes -Discussed that GLP-1 RA are not covered by insurance company for prediabetes along. - Provide information on dietary changes and exercise. - Refer to weight management clinic   General Health Maintenance Requires routine health maintenance including cholesterol screening and management of allergies and vertigo. - Order cholesterol test. - Refill Flonase for allergies. - Refill meclizine for vertigo.  Follow-up - Provide referral information for dentist and eye doctor on discharge summary. - Follow up on rheumatology notes and send a message with findings. - Place referral to another rheumatologist.      Meds ordered this encounter  Medications   cetirizine (ZYRTEC) 10 MG tablet    Sig: Take 1 tablet (10 mg total) by mouth daily.    Dispense:  30 tablet    Refill:  1   fluticasone (FLONASE) 50 MCG/ACT nasal spray    Sig: Place 2 sprays into both nostrils daily.    Dispense:  16 g    Refill:  6   lisinopril-hydrochlorothiazide (ZESTORETIC) 20-12.5 MG tablet    Sig: Take 1 tablet by mouth daily.    Dispense:  90 tablet    Refill:  1   meclizine (ANTIVERT) 25 MG tablet    Sig: Take 1 tablet (25 mg total) by mouth 3 (three) times daily as needed for dizziness.    Dispense:  60 tablet    Refill:  1   meloxicam (MOBIC) 7.5 MG tablet    Sig: Take 1 tablet (7.5 mg total) by mouth daily.    Dispense:  30 tablet    Refill:  1    Follow-up: Return in about 6 months (around 05/20/2024) for cancel previous appointment.       Hoy Register, MD, FAAFP. [facility] [place], [place] [phone]   11/18/2023, 3:03 PM

## 2023-11-19 ENCOUNTER — Other Ambulatory Visit: Payer: Self-pay | Admitting: Family Medicine

## 2023-11-19 ENCOUNTER — Encounter: Payer: Self-pay | Admitting: Family Medicine

## 2023-11-19 DIAGNOSIS — Z0289 Encounter for other administrative examinations: Secondary | ICD-10-CM

## 2023-11-19 DIAGNOSIS — M109 Gout, unspecified: Secondary | ICD-10-CM | POA: Insufficient documentation

## 2023-11-19 LAB — LP+NON-HDL CHOLESTEROL
Cholesterol, Total: 156 mg/dL (ref 100–199)
HDL: 35 mg/dL — ABNORMAL LOW (ref >39)
LDL Chol Calc (NIH): 90 mg/dL (ref 0–99)
Total Non-HDL-Chol (LDL+VLDL): 121 mg/dL (ref 0–129)
Triglycerides: 178 mg/dL — ABNORMAL HIGH (ref 0–149)
VLDL Cholesterol Cal: 31 mg/dL (ref 5–40)

## 2023-11-19 LAB — URIC ACID: Uric Acid: 7.9 mg/dL — ABNORMAL HIGH (ref 2.6–6.2)

## 2023-11-19 MED ORDER — ALLOPURINOL 300 MG PO TABS: 300.0000 mg | ORAL_TABLET | Freq: Every day | ORAL | 1 refills | Status: DC

## 2023-11-23 ENCOUNTER — Ambulatory Visit: Admitting: Bariatrics

## 2023-11-23 ENCOUNTER — Encounter: Payer: Self-pay | Admitting: Bariatrics

## 2023-11-23 VITALS — BP 111/69 | HR 89 | Temp 97.9°F | Ht 62.5 in | Wt 221.0 lb

## 2023-11-23 DIAGNOSIS — M109 Gout, unspecified: Secondary | ICD-10-CM

## 2023-11-23 DIAGNOSIS — Z6839 Body mass index (BMI) 39.0-39.9, adult: Secondary | ICD-10-CM | POA: Diagnosis not present

## 2023-11-23 DIAGNOSIS — I10 Essential (primary) hypertension: Secondary | ICD-10-CM

## 2023-11-23 DIAGNOSIS — E669 Obesity, unspecified: Secondary | ICD-10-CM

## 2023-11-23 DIAGNOSIS — E66812 Obesity, class 2: Secondary | ICD-10-CM

## 2023-11-23 NOTE — Progress Notes (Signed)
 Office: [phone]  /  Fax: [phone]   Initial Visit  [name] was seen in clinic today to evaluate for obesity. She is interested in losing weight to improve overall health and reduce the risk of weight related complications. She presents today to review program treatment options, initial physical assessment, and evaluation.     She was referred by: PCP  When asked what else they would like to accomplish? She states: Adopt healthier eating patterns, Improve energy levels and physical activity, and Improve quality of life  When asked how has your weight affected you? She states: Has affected self-esteem, Having fatigue, Having poor endurance, and Problems with eating patterns  Some associated conditions: Hypertension and Other: Gout  Contributing factors: Family history of obesity and Slow metabolism for age  Weight promoting medications identified: None  Current nutrition plan: Portion control / smart choices and Other: increased activities.   Current level of physical activity: None  Current or previous pharmacotherapy: Is interested in pharmacotherapy  Response to medication: Never tried medications   Past medical history includes:   Past Medical History:  Diagnosis Date   Anemia    Chronic hypertension during pregnancy, antepartum 12/24/2017   [x]  Aspirin 81 mg daily after 12 weeks; discontinue after 36 weeks Current antihypertensives:  None   Baseline and surveillance labs (pulled in from [facility], refresh links as needed)  Lab Results Component Value Date  PLT 353 11/25/2016  CREATININE 0.50 11/24/2017  AST 24 11/25/2016  ALT 17 11/25/2016  PROTCRRATIO 0.30 (H) 12/31/2015   Antenatal Testing CHTN - O10.919  Group I  BP < 140/90, no preecl   Hypertension    Pregnancy induced hypertension      Objective:   BP 111/69   Pulse 89   Temp 97.9 F (36.6 C)   Ht 5' 2.5" (1.588 m)   Wt 221 lb (100.2 kg)   SpO2 96%   BMI 39.78 kg/m  She was weighed on the  bioimpedance scale: Body mass index is 39.78 kg/m.  Peak Weight:229 lbs , Body Fat%:45 %, Visceral Fat Rating:12, Weight trend over the last 12 months: Increasing  General:  Alert, oriented and cooperative. Patient is in no acute distress.  Respiratory: Normal respiratory effort, no problems with respiration noted  Extremities: Normal range of motion.    Mental Status: Normal mood and affect. Normal behavior. Normal judgment and thought content.   DIAGNOSTIC DATA REVIEWED:  BMET    Component Value Date/Time   NA 137 05/29/2023 0927   NA 138 09/30/2022 1537   K 3.5 05/29/2023 0927   CL 101 05/29/2023 0927   CO2 22 05/29/2023 0927   GLUCOSE 97 05/29/2023 0927   BUN 10 05/29/2023 0927   BUN 16 09/30/2022 1537   CREATININE 0.69 05/29/2023 0927   CALCIUM 8.7 (L) 05/29/2023 0927   GFRNONAA >60 05/29/2023 0927   GFRAA >60 05/01/2020 1059   Lab Results  Component Value Date   HGBA1C 5.8 (A) 11/18/2023   HGBA1C 4.7 (L) 06/24/2021   No results found for: "INSULIN" CBC    Component Value Date/Time   WBC 7.6 05/29/2023 0927   RBC 3.68 (L) 05/29/2023 0927   HGB 10.7 (L) 05/29/2023 0927   HGB 12.4 06/24/2021 1638   HCT 32.1 (L) 05/29/2023 0927   HCT 37.1 06/24/2021 1638   PLT 340 05/29/2023 0927   PLT 389 06/24/2021 1638   MCV 87.2 05/29/2023 0927   MCV 89 06/24/2021 1638   MCH 29.1 05/29/2023 0927  MCHC 33.3 05/29/2023 0927   RDW 13.2 05/29/2023 0927   RDW 12.1 06/24/2021 1638   Iron/TIBC/Ferritin/ %Sat    Component Value Date/Time   IRON 55 09/30/2022 1537   TIBC 343 09/30/2022 1537   FERRITIN 134 09/30/2022 1537   IRONPCTSAT 16 09/30/2022 1537   Lipid Panel     Component Value Date/Time   CHOL 156 11/18/2023 1100   TRIG 178 (H) 11/18/2023 1100   HDL 35 (L) 11/18/2023 1100   LDLCALC 90 11/18/2023 1100   Hepatic Function Panel     Component Value Date/Time   PROT 7.4 05/29/2023 0927   PROT 7.6 06/24/2021 1638   ALBUMIN 3.7 05/29/2023 0927   ALBUMIN 4.7  06/24/2021 1638   AST 23 05/29/2023 0927   ALT 26 05/29/2023 0927   ALKPHOS 55 05/29/2023 0927   BILITOT 0.6 05/29/2023 0927   BILITOT 0.4 06/24/2021 1638      Component Value Date/Time   TSH 0.239 (L) 01/25/2018 1651     Assessment and Plan:   Hypertension Hypertension stable.  Medication(s): Zestoretic 20-12.5 mg daily.   BP Readings from Last 3 Encounters:  11/23/23 111/69  11/18/23 109/72  08/21/23 134/75   Lab Results  Component Value Date   CREATININE 0.69 05/29/2023   CREATININE 0.71 09/30/2022   CREATININE 0.74 06/24/2021     Plan: Continue all antihypertensives at current dosages. No added salt. Will keep sodium content to 1,500 mg or less per day.   Gout:   She has a history of gout and is taking allopurinol  Plan: She will continue allopurinol.  Will begin our program focusing on weight loss and some appropriate exercise.    Generalized Obesity: Current BMI 39    Obesity Treatment / Action Plan:  Patient will work on garnering support from family and friends to begin weight loss journey. Will work on eliminating or reducing the presence of highly palatable, calorie dense foods in the home. Will complete provided nutritional and psychosocial assessment questionnaire before the next appointment. Will be scheduled for indirect calorimetry to determine resting energy expenditure in a fasting state.  This will allow [place] to create a reduced calorie, high-protein meal plan to promote loss of fat mass while preserving muscle mass. Counseled on the health benefits of losing 5%-15% of total body weight. Was counseled on nutritional approaches to weight loss and benefits of reducing processed foods and consuming plant-based foods and high quality protein as part of nutritional weight management. Was counseled on pharmacotherapy and role as an adjunct in weight management.   Obesity Education Performed Today:  She was weighed on the bioimpedance scale and results  were discussed and documented in the synopsis.  We discussed obesity as a disease and the importance of a more detailed evaluation of all the factors contributing to the disease.  We discussed the importance of long term lifestyle changes which include nutrition, exercise and behavioral modifications as well as the importance of customizing this to her specific health and social needs.  We discussed the benefits of reaching a healthier weight to alleviate the symptoms of existing conditions and reduce the risks of the biomechanical, metabolic and psychological effects of obesity.  Discussed New Patient/Late Arrival, and Cancellation Policies. Patient voiced understanding and allowed to ask questions.   [name] appears to be in the action stage of change and states they are ready to start intensive lifestyle modifications and behavioral modifications.  30 minutes was spent today on this visit including the  above counseling, pre-visit chart review, and post-visit documentation.  Reviewed by clinician on day of visit: allergies, medications, problem list, medical history, surgical history, family history, social history, and previous encounter notes.    [name] A. Lorretta HarpO.

## 2023-11-27 ENCOUNTER — Ambulatory Visit: Attending: Family Medicine | Admitting: Physical Therapy

## 2023-11-30 ENCOUNTER — Encounter: Payer: Self-pay | Admitting: Family Medicine

## 2023-11-30 ENCOUNTER — Ambulatory Visit: Admitting: Rheumatology

## 2023-11-30 NOTE — Telephone Encounter (Signed)
 Called patient to clarify if she was taking meloxicam, she stated that she wasn't, due to side effects she read on the package and have only been taking tylenol and ibuprofen. Asking if there is an alternative to meloxicam. Patient also has a upcoming  Physical therapy appointment as well

## 2023-12-03 ENCOUNTER — Ambulatory Visit: Admitting: Family Medicine

## 2023-12-03 ENCOUNTER — Encounter: Payer: Self-pay | Admitting: Family Medicine

## 2023-12-03 VITALS — BP 106/65 | HR 81 | Temp 98.0°F | Ht 62.5 in | Wt 219.0 lb

## 2023-12-03 DIAGNOSIS — R5383 Other fatigue: Secondary | ICD-10-CM | POA: Diagnosis not present

## 2023-12-03 DIAGNOSIS — G4730 Sleep apnea, unspecified: Secondary | ICD-10-CM | POA: Diagnosis not present

## 2023-12-03 DIAGNOSIS — I10 Essential (primary) hypertension: Secondary | ICD-10-CM

## 2023-12-03 DIAGNOSIS — D649 Anemia, unspecified: Secondary | ICD-10-CM

## 2023-12-03 DIAGNOSIS — D500 Iron deficiency anemia secondary to blood loss (chronic): Secondary | ICD-10-CM | POA: Diagnosis not present

## 2023-12-03 DIAGNOSIS — M109 Gout, unspecified: Secondary | ICD-10-CM | POA: Diagnosis not present

## 2023-12-03 DIAGNOSIS — R0602 Shortness of breath: Secondary | ICD-10-CM | POA: Diagnosis not present

## 2023-12-03 DIAGNOSIS — R7303 Prediabetes: Secondary | ICD-10-CM | POA: Diagnosis not present

## 2023-12-03 DIAGNOSIS — Z6839 Body mass index (BMI) 39.0-39.9, adult: Secondary | ICD-10-CM | POA: Diagnosis not present

## 2023-12-03 DIAGNOSIS — E66812 Obesity, class 2: Secondary | ICD-10-CM | POA: Diagnosis not present

## 2023-12-03 NOTE — Progress Notes (Signed)
 At a Glance:  Vitals Temp: 98 F (36.7 C) BP: 106/65 Pulse Rate: 81 SpO2: 99 %   Anthropometric Measurements Height: 5' 2.5" (1.588 m) Weight: 219 lb (99.3 kg) BMI (Calculated): 39.39 Starting Weight: 219lb Peak Weight: 229lb   Body Composition  Body Fat %: 45.5 % Fat Mass (lbs): 100 lbs Muscle Mass (lbs): 113.6 lbs Total Body Water (lbs): 82 lbs Visceral Fat Rating : 12   Other Clinical Data RMR: 2290 Fasting: Yes Labs: Yes Today's Visit #: 1 Starting Date: 12/03/23    EKG: Normal sinus rhythm, rate 81.  Indirect Calorimeter completed today shows a VO2 of 331 and a REE of 2290.  [name] calculated basal metabolic rate is 1610 thus [name] basal metabolic rate is better than expected.  Chief Complaint:  Obesity   Subjective:  [name] (MR# [id number]) is a 38 y.o. female who presents for evaluation and treatment of obesity and related comorbidities.   [name] is currently in the action stage of change and ready to dedicate time achieving and maintaining a healthier weight. [name] is interested in becoming our patient and working on intensive lifestyle modifications including (but not limited to) diet and exercise for weight loss.  [name] has been struggling with [name] weight. She has been unsuccessful in either losing weight, maintaining weight loss, or reaching [name] healthy weight goal. She lives with [name] 3 youngest kids and is a single mom.  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  She does some walking.  Typically skips breakfast or lunch due to [name] schedule.  Kids are picky eaters.  She has SSBs daily.  She doesn't like much meat.  [name]'s habits were reviewed today and are as follows: [name] family eats meals together, [name] desired weight loss is 70 lb, she started gaining weight after having [name] 2nd child, she snacks frequently in the evenings, she skips meals frequently, she is frequently drinking liquids with calories, she frequently makes poor food  choices, she has problems with excessive hunger, she frequently eats larger portions than normal, and she struggles with emotional eating.   Other Fatigue [name] admits to daytime somnolence and admits to waking up still tired. Patient has a history of symptoms of daytime fatigue. [name] generally gets 5 or 6 hours of sleep per night, and states that she has nightime awakenings. Snoring is present. Apneic episodes are present. Epworth Sleepiness Score is 9.   Shortness of Breath [name] notes increasing shortness of breath with exercising and seems to be worsening over time with weight gain. She notes getting out of breath sooner with activity than she used to. This has gotten worse recently. [name] denies shortness of breath at rest or orthopnea.   Depression Screen [name]'s Food and Mood (modified PHQ-9) score was 8.     11/18/2023    9:50 AM  Depression screen PHQ 2/9  Decreased Interest 0  Down, Depressed, Hopeless 0  PHQ - 2 Score 0  Altered sleeping 0  Tired, decreased energy 0  Change in appetite 0  Feeling bad or failure about yourself  0  Trouble concentrating 0  Moving slowly or fidgety/restless 0  Suicidal thoughts 0  PHQ-9 Score 0     Assessment and Plan:   Other Fatigue [name] does feel that [name] weight is causing [name] energy to be lower than it should be. Fatigue may be related to obesity, depression or many other causes. Labs will be ordered, and in the meanwhile, [name] will  focus on self care including making healthy food choices, increasing physical activity and focusing on stress reduction.  Shortness of Breath [name] does feel that she gets out of breath more easily that she used to when she exercises. [name]'s shortness of breath appears to be obesity related and exercise induced. She has agreed to work on weight loss and gradually increase exercise to treat [name] exercise induced shortness of breath. Will continue to monitor closely.  [name] had a positive  depression screening. Depression is commonly associated with obesity and often results in emotional eating behaviors. We will monitor this closely and work on CBT to help improve the non-hunger eating patterns. Referral to Psychology may be required if no improvement is seen as she continues in our clinic.    Problem List Items Addressed This Visit     Hypertension Blood pressure well-controlled on lisinopril/HCTZ 20/12.5 mg daily.  EKG reviewed with patient today, normal sinus rhythm without ischemia or arrhythmia.  Continue current antihypertensive medications per PCP.  Look for improvements with weight reduction.   Anemia History of iron deficiency anemia currently on ferrous gluconate 324 mg twice daily.  She did require IV iron infusion with [name] last pregnancy.  [name] energy level still remains fairly poor.  She has regular menses, heavy for 2 days.  Check iron levels with labs today.   Relevant Orders   Ferritin   Iron and TIBC   Gout She is currently on allopurinol 300 mg once daily to prevent gouty attack.  She has a history of gout and has worked on limiting [name] intake of red meat and shellfish.  Will watch for any gouty flareups as she loses weight.   Other Visit Diagnoses       SOBOE (shortness of breath on exertion)    -  Primary     Other fatigue       Relevant Orders   EKG 12-Lead (Completed)   VITAMIN D 25 Hydroxy (Vit-D Deficiency, Fractures)   TSH   T4, free   T3   Insulin, random   Folate   Comprehensive metabolic panel with GFR   Vitamin B12   CBC with Differential/Platelet     Class 2 severe obesity due to excess calories with serious comorbidity and body mass index (BMI) of 39.0 to 39.9 in adult [name])     Reviewed results of [name] indirect calorimetry, category 3 meal plan and 100-calorie snack list provided.  Will follow-up results of [name] labs obtained today.  Consider adding on a polysomnogram given [name] Epworth score and daytime somnolence.  Consider future use  of Wegovy in combination with dietary change and exercise.      Prediabetes   Lab Results  Component Value Date   HGBA1C 5.8 (A) 11/18/2023  She has a history of prediabetes, a personal history for gestational diabetes and a positive family history for type 2 diabetes putting [name] at risk.  Begin prescribed diet which is low in added sugar and starches.  We discussed a long-term plan for more regular physical activity, tracking of daily steps and weight reduction.  Consider future use of metformin.        Sleep disorder breathing    Epworth sleepiness score is 9 with inadequate sleep at night.  She has mild snoring and nighttime awakenings with possible apnea.  Plan to obtain a sleep study, order at next visit.       Boni is currently in the action stage of change and [name] goal is  to continue with weight loss efforts. I recommend Tristine begin the structured treatment plan as follows:  She has agreed to Category 3 Plan  Exercise goals: All adults should avoid inactivity. Some activity is better than none, and adults who participate in any amount of physical activity, gain some health benefits.  Behavioral modification strategies:increasing lean protein intake, decreasing simple carbohydrates, increasing vegetables, increase H2O intake, decrease liquid calories, increase high fiber foods, decreasing eating out, no skipping meals, meal planning and cooking strategies, and keeping healthy foods in the home  She was informed of the importance of frequent follow-up visits to maximize [name] success with intensive lifestyle modifications for [name] multiple health conditions. She was informed we would discuss [name] lab results at [name] next visit unless there is a critical issue that needs to be addressed sooner. [place] agreed to keep [name] next visit at the agreed upon time to discuss these results.  Objective:  General: Cooperative, alert, well developed, in no acute distress. HEENT: Conjunctivae and lids  unremarkable. Cardiovascular: Regular rhythm.  Lungs: Normal work of breathing. Neurologic: No focal deficits.   Lab Results  Component Value Date   CREATININE 0.69 05/29/2023   BUN 10 05/29/2023   NA 137 05/29/2023   K 3.5 05/29/2023   CL 101 05/29/2023   CO2 22 05/29/2023   Lab Results  Component Value Date   ALT 26 05/29/2023   AST 23 05/29/2023   ALKPHOS 55 05/29/2023   BILITOT 0.6 05/29/2023   Lab Results  Component Value Date   HGBA1C 5.8 (A) 11/18/2023   HGBA1C 4.7 (L) 06/24/2021   No results found for: "INSULIN" Lab Results  Component Value Date   TSH 0.239 (L) 01/25/2018   Lab Results  Component Value Date   CHOL 156 11/18/2023   HDL 35 (L) 11/18/2023   LDLCALC 90 11/18/2023   TRIG 178 (H) 11/18/2023   Lab Results  Component Value Date   WBC 7.6 05/29/2023   HGB 10.7 (L) 05/29/2023   HCT 32.1 (L) 05/29/2023   MCV 87.2 05/29/2023   PLT 340 05/29/2023   Lab Results  Component Value Date   IRON 55 09/30/2022   TIBC 343 09/30/2022   FERRITIN 134 09/30/2022    Attestation Statements:  Reviewed by clinician on day of visit: allergies, medications, problem list, medical history, surgical history, family history, social history, and previous encounter notes.  Time spent on visit including pre-visit chart review and post-visit charting and face- to face care including nutritional counseling, review of EKG, interpretation of body composition scale and indirect calorimetry and nutrition prescription  was 44 minutes.   Seymour Bars, D.O. DABFM, DABOM [facility] [address] [place], [place] [address] [phone]

## 2023-12-04 LAB — CBC WITH DIFFERENTIAL/PLATELET
Basophils Absolute: 0.1 10*3/uL (ref 0.0–0.2)
Basos: 1 %
EOS (ABSOLUTE): 0.1 10*3/uL (ref 0.0–0.4)
Eos: 1 %
Hematocrit: 35.9 % (ref 34.0–46.6)
Hemoglobin: 11.7 g/dL (ref 11.1–15.9)
Immature Grans (Abs): 0 10*3/uL (ref 0.0–0.1)
Immature Granulocytes: 0 %
Lymphocytes Absolute: 2.5 10*3/uL (ref 0.7–3.1)
Lymphs: 36 %
MCH: 29.3 pg (ref 26.6–33.0)
MCHC: 32.6 g/dL (ref 31.5–35.7)
MCV: 90 fL (ref 79–97)
Monocytes Absolute: 0.4 10*3/uL (ref 0.1–0.9)
Monocytes: 5 %
Neutrophils Absolute: 3.9 10*3/uL (ref 1.4–7.0)
Neutrophils: 57 %
Platelets: 422 10*3/uL (ref 150–450)
RBC: 3.99 x10E6/uL (ref 3.77–5.28)
RDW: 12.6 % (ref 11.7–15.4)
WBC: 6.9 10*3/uL (ref 3.4–10.8)

## 2023-12-04 LAB — COMPREHENSIVE METABOLIC PANEL WITH GFR
ALT: 19 IU/L (ref 0–32)
AST: 18 IU/L (ref 0–40)
Albumin: 4.6 g/dL (ref 3.9–4.9)
Alkaline Phosphatase: 65 IU/L (ref 44–121)
BUN/Creatinine Ratio: 16 (ref 9–23)
BUN: 12 mg/dL (ref 6–20)
Bilirubin Total: 0.4 mg/dL (ref 0.0–1.2)
CO2: 23 mmol/L (ref 20–29)
Calcium: 9.8 mg/dL (ref 8.7–10.2)
Chloride: 99 mmol/L (ref 96–106)
Creatinine, Ser: 0.74 mg/dL (ref 0.57–1.00)
Globulin, Total: 2.9 g/dL (ref 1.5–4.5)
Glucose: 89 mg/dL (ref 70–99)
Potassium: 4.8 mmol/L (ref 3.5–5.2)
Sodium: 137 mmol/L (ref 134–144)
Total Protein: 7.5 g/dL (ref 6.0–8.5)
eGFR: 107 mL/min/{1.73_m2} (ref >59)

## 2023-12-04 LAB — IRON AND TIBC
Iron Saturation: 15 % (ref 15–55)
Iron: 49 ug/dL (ref 27–159)
Total Iron Binding Capacity: 326 ug/dL (ref 250–450)
UIBC: 277 ug/dL (ref 131–425)

## 2023-12-04 LAB — T3: T3, Total: 163 ng/dL (ref 71–180)

## 2023-12-04 LAB — INSULIN, RANDOM: INSULIN: 19.6 u[IU]/mL (ref 2.6–24.9)

## 2023-12-04 LAB — T4, FREE: Free T4: 1.17 ng/dL (ref 0.82–1.77)

## 2023-12-04 LAB — VITAMIN B12: Vitamin B-12: >2000 pg/mL — ABNORMAL HIGH (ref 232–1245)

## 2023-12-04 LAB — FERRITIN: Ferritin: 129 ng/mL (ref 15–150)

## 2023-12-04 LAB — FOLATE: Folate: 8.4 ng/mL (ref >3.0)

## 2023-12-04 LAB — TSH: TSH: 0.359 u[IU]/mL — ABNORMAL LOW (ref 0.450–4.500)

## 2023-12-04 LAB — VITAMIN D 25 HYDROXY (VIT D DEFICIENCY, FRACTURES): Vit D, 25-Hydroxy: 12.1 ng/mL — ABNORMAL LOW (ref 30.0–100.0)

## 2023-12-07 ENCOUNTER — Ambulatory Visit: Admitting: Physical Therapy

## 2023-12-10 ENCOUNTER — Ambulatory Visit: Payer: Medicaid Other | Admitting: Rheumatology

## 2023-12-17 ENCOUNTER — Ambulatory Visit: Admitting: Bariatrics

## 2023-12-17 ENCOUNTER — Encounter: Payer: Self-pay | Admitting: Bariatrics

## 2023-12-17 VITALS — BP 107/71 | HR 83 | Temp 98.1°F | Ht 62.5 in | Wt 216.0 lb

## 2023-12-17 DIAGNOSIS — R632 Polyphagia: Secondary | ICD-10-CM | POA: Diagnosis not present

## 2023-12-17 DIAGNOSIS — R7989 Other specified abnormal findings of blood chemistry: Secondary | ICD-10-CM

## 2023-12-17 DIAGNOSIS — E88819 Insulin resistance, unspecified: Secondary | ICD-10-CM

## 2023-12-17 DIAGNOSIS — E669 Obesity, unspecified: Secondary | ICD-10-CM

## 2023-12-17 DIAGNOSIS — E559 Vitamin D deficiency, unspecified: Secondary | ICD-10-CM

## 2023-12-17 DIAGNOSIS — Z6838 Body mass index (BMI) 38.0-38.9, adult: Secondary | ICD-10-CM

## 2023-12-17 MED ORDER — WEGOVY 0.25 MG/0.5ML ~~LOC~~ SOAJ: 0.2500 mg | SUBCUTANEOUS | 0 refills | Status: DC

## 2023-12-17 MED ORDER — VITAMIN D (ERGOCALCIFEROL) 1.25 MG (50000 UNIT) PO CAPS: 50000.0000 [IU] | ORAL_CAPSULE | ORAL | 0 refills | Status: DC

## 2023-12-17 NOTE — Progress Notes (Signed)
 First follow-up after initial visit.        WEIGHT SUMMARY AND BIOMETRICS  Weight Lost Since Last Visit: 3lb  Weight Gained Since Last Visit: 0lb   Vitals Temp: 98.1 F (36.7 C) BP: 107/71 Pulse Rate: 83 SpO2: 99 %   Anthropometric Measurements Height: 5' 2.5" (1.588 m) Weight: 216 lb (98 kg) BMI (Calculated): 38.85 Weight at Last Visit: 219lb Weight Lost Since Last Visit: 3lb Weight Gained Since Last Visit: 0lb Starting Weight: 219lb Total Weight Loss (lbs): 3 lb (1.361 kg)   Body Composition  Body Fat %: 44.6 % Fat Mass (lbs): 96.6 lbs Muscle Mass (lbs): 114.2 lbs Total Body Water (lbs): 81 lbs Visceral Fat Rating : 11   Other Clinical Data Fasting: Yes Labs: No Today's Visit #: 2 Starting Date: 12/03/23    OBESITY [name] is here to discuss her progress with her obesity treatment plan along with follow-up of her obesity related diagnoses.    Nutrition Plan: the Category 3 plan - 90% adherence.  Current exercise: none  Interim History:  She is down 3 lbs since her last visit.  Eating all of the food on the plan., Protein intake is as prescribed, Is not skipping meals, and Water intake is adequate.  Initial positives regarding the dietary plan:  Initial challenges regarding  the dietary plan:   Pharmacotherapy: [name] is on none Hunger is poorly controlled.  Cravings are well controlled.  Assessment/Plan:   Vitamin D  Deficiency Vitamin D  is not at goal of 50.  Most recent vitamin D  level was 12.1. She is not on vitamin D .  Lab Results  Component Value Date   VD25OH 12.1 (L) 12/03/2023    Plan: Start prescription vitamin D  50,000 IU weekly.   Insulin  Resistance [name] has had elevated fasting insulin  readings. Goal is HgbA1c < 5.7, fasting insulin  at l0 or less, and preferably at 5.  She reports  polyphagia. Medication(s): none Lab Results  Component Value Date   HGBA1C 5.8 (A) 11/18/2023   Lab Results  Component Value Date   INSULIN  19.6 12/03/2023    Plan Medication(s): Wegovy  0.25 mg SQ weekly Will work on the agreed upon plan. Will minimize refined carbohydrates ( sweets and starches), and focus more on complex carbohydrates.  Increase the micronutrients found in leafy greens, which include magnesium , polyphenols, and vitamin C which have been postulated to help with insulin  sensitivity. Minimize "fast food" and cook more meals at home.  Increase fiber to 25 to 30 grams daily.  Information sheet on " Insulin  Resistance and Prediabetes".    Low TSH:   She denies any thyroid issues. She does have a family history.   Plan: Will recheck her thyroid panel in approximately 6 to 8 weeks  Polyphagia [name] endorses excessive hunger.  Medication(s): none  Plan: Medication(s): Wegovy  0.25 mg SQ weekly Will increase water, protein and fiber to help assuage hunger.  Will minimize foods that have a high glucose index/load to minimize reactive hypoglycemia.   Polyphagia [name] endorses excessive hunger.  Medication(s): none  Plan: Medication(s): Will start Wegovy  0.25 mg SQ weekly Will increase water, protein and fiber to help assuage hunger.  Will minimize foods that have a high glucose index/load to minimize reactive hypoglycemia.   [name] denies personal or family history of thyroid cancer, history of pancreatitis, or current cholelithiasis. [name] was informed of the most common side effects (nausea, constipation, diarrhea). She was given GLP-1 information sheet.  Patient informed to watch for possible symptoms, such as  a lump or swelling in the neck, hoarseness, trouble swallowing, or shortness of breath. If you have any of these symptoms, tell your healthcare provide.   She has a history of bilateral tubal ligation and thus does not need birth control.  She has  been placed on a 500 calorie deficit diet.  She has been advised to exercise at least 150 minutes per week, both cardio and resistance.     Generalized Obesity: Current BMI BMI (Calculated): 38.85   Pharmacotherapy Plan Start  Wegovy  0.25 mg SQ weekly  [name] is currently in the action stage of change. As such, her goal is to continue with weight loss efforts.  She has agreed to the Category 3 plan.  Exercise goals: All adults should avoid inactivity. Some physical activity is better than none, and adults who participate in any amount of physical activity gain some health benefits.  Behavioral modification strategies: increasing lean protein intake, decreasing simple carbohydrates , no meal skipping, decrease eating out, meal planning , increase water intake, better snacking choices, planning for success, and increasing vegetables.  [name] has agreed to follow-up with our clinic in 2 weeks.   Labs reviewed today from last visit (CMP,  insulin , vitamin D , B 12, CBC, glucose, and thyroid panel).   Objective:   VITALS: Per patient if applicable, see vitals. GENERAL: Alert and in no acute distress. CARDIOPULMONARY: No increased WOB. Speaking in clear sentences.  PSYCH: Pleasant and cooperative. Speech normal rate and rhythm. Affect is appropriate. Insight and judgement are appropriate. Attention is focused, linear, and appropriate.  NEURO: Oriented as arrived to appointment on time with no prompting.   Attestation Statements:   This was prepared with the assistance of Engineer, civil (consulting).  Occasional wrong-word or sound-a-like substitutions may have occurred due to the inherent limitations of voice recognition software.   Kirk Peper, DO

## 2023-12-21 ENCOUNTER — Telehealth: Payer: Self-pay

## 2023-12-21 NOTE — Telephone Encounter (Signed)
 PA submitted through Cover My Meds for Wegovy . Awaiting insurance determination.  Key: [id number]

## 2023-12-21 NOTE — Telephone Encounter (Signed)
 Approved. WEGOVY  0.25MG /0.5ML Soln [employer] is approved from 12/21/2023 to 06/21/2024.

## 2023-12-23 ENCOUNTER — Ambulatory Visit: Admitting: Physical Therapy

## 2023-12-31 ENCOUNTER — Ambulatory Visit: Admitting: Bariatrics

## 2024-01-04 ENCOUNTER — Ambulatory Visit: Attending: Family Medicine | Admitting: Physical Therapy

## 2024-01-04 NOTE — Therapy (Deleted)
 OUTPATIENT PHYSICAL THERAPY SHOULDER EVALUATION   Patient Name: [name] MRN: [id number] DOB:07-Mar-1986, 38 y.o., female Today's Date: 01/04/2024  END OF SESSION:   Past Medical History:  Diagnosis Date   Anemia    Chronic hypertension during pregnancy, antepartum 12/24/2017   [x]  Aspirin  81 mg daily after 12 weeks; discontinue after 36 weeks Current antihypertensives:  None   Baseline and surveillance labs (pulled in from [facility], refresh links as needed)  Lab Results Component Value Date  PLT 353 11/25/2016  CREATININE 0.50 11/24/2017  AST 24 11/25/2016  ALT 17 11/25/2016  PROTCRRATIO 0.30 (H) 12/31/2015   Antenatal Testing CHTN - O10.919  Group I  BP < 140/90, no preecl   Diabetes (HCC)    Edema    High cholesterol    Hypertension    IBS (irritable bowel syndrome)    Joint pain    Pregnancy induced hypertension    Past Surgical History:  Procedure Laterality Date   TUBAL LIGATION Bilateral 07/20/2018   Procedure: POST PARTUM TUBAL LIGATION;  Surgeon: Malka Sea, DO;  Location: [facility];  Service: Gynecology;  Laterality: Bilateral;   Patient Active Problem List   Diagnosis Date Noted   Gout 11/19/2023   Administrative encounter 01/01/2023   Severe obesity (BMI >= 40) (HCC) 09/30/2022   Patellar tendinitis of right knee 09/26/2022   Vagina, candidiasis 05/15/2022   BMI 38.0-38.9,adult 06/24/2021   Anemia 03/20/2021   Hypertension     PCP: ***  REFERRING PROVIDER: ***  REFERRING DIAG: ***  THERAPY DIAG:  No diagnosis found.  Rationale for Evaluation and Treatment: {HABREHAB:27488}  ONSET DATE: ***  SUBJECTIVE:                                                                                                                                                                                      SUBJECTIVE STATEMENT: *** Hand dominance: {MISC; OT HAND [phone]}  PERTINENT HISTORY: ***  PAIN:  Are you having pain?  {OPRCPAIN:27236}  PRECAUTIONS: {Therapy precautions:24002}  RED FLAGS: {PT Red Flags:29287}   WEIGHT BEARING RESTRICTIONS: {Yes ***/No:24003}  FALLS:  Has patient fallen in last 6 months? {fallsyesno:27318}  LIVING ENVIRONMENT: Lives with: {OPRC lives with:25569::"lives with their family"} Lives in: {Lives in:25570} Stairs: {opstairs:27293} Has following equipment at home: {Assistive devices:23999}  OCCUPATION: ***  PLOF: {PLOF:24004}  PATIENT GOALS:***  NEXT MD VISIT:   OBJECTIVE:  Note: Objective measures were completed at Evaluation unless otherwise noted.  DIAGNOSTIC FINDINGS:  ***  PATIENT SURVEYS:  {rehab surveys:24030:a}  COGNITION: Overall cognitive status: {cognition:24006}     SENSATION: {sensation:27233}  POSTURE: ***  UPPER EXTREMITY ROM:   {AROM/PROM:27142} ROM Right  eval Left eval  Shoulder flexion    Shoulder extension    Shoulder abduction    Shoulder adduction    Shoulder internal rotation    Shoulder external rotation    Elbow flexion    Elbow extension    Wrist flexion    Wrist extension    Wrist ulnar deviation    Wrist radial deviation    Wrist pronation    Wrist supination    (Blank rows = not tested)  UPPER EXTREMITY MMT:  MMT Right eval Left eval  Shoulder flexion    Shoulder extension    Shoulder abduction    Shoulder adduction    Shoulder internal rotation    Shoulder external rotation    Middle trapezius    Lower trapezius    Elbow flexion    Elbow extension    Wrist flexion    Wrist extension    Wrist ulnar deviation    Wrist radial deviation    Wrist pronation    Wrist supination    Grip strength (lbs)    (Blank rows = not tested)  SHOULDER SPECIAL TESTS: Impingement tests: {shoulder impingement test:25231:a} SLAP lesions: {SLAP lesions:25232} Instability tests: {shoulder instability test:25233} Rotator cuff assessment: {rotator cuff assessment:25234} Biceps assessment: {biceps  assessment:25235}  JOINT MOBILITY TESTING:  ***  PALPATION:  ***                                                                                                                             TREATMENT DATE: ***   PATIENT EDUCATION: Education details: *** Person educated: {Person educated:25204} Education method: {Education Method:25205} Education comprehension: {Education Comprehension:25206}  HOME EXERCISE PROGRAM: ***  ASSESSMENT:  CLINICAL IMPRESSION: Patient is a *** y.o. *** who was seen today for physical therapy evaluation and treatment for ***.   OBJECTIVE IMPAIRMENTS: {opptimpairments:25111}.   ACTIVITY LIMITATIONS: {activitylimitations:27494}  PARTICIPATION LIMITATIONS: {participationrestrictions:25113}  PERSONAL FACTORS: {Personal factors:25162} are also affecting patient's functional outcome.   REHAB POTENTIAL: {rehabpotential:25112}  CLINICAL DECISION MAKING: {clinical decision making:25114}  EVALUATION COMPLEXITY: {Evaluation complexity:25115}   GOALS: Goals reviewed with patient? {yes/no:20286}  SHORT TERM GOALS: Target date: ***  *** Baseline: Goal status: INITIAL  2.  *** Baseline:  Goal status: INITIAL  3.  *** Baseline:  Goal status: INITIAL  4.  *** Baseline:  Goal status: INITIAL  5.  *** Baseline:  Goal status: INITIAL  6.  *** Baseline:  Goal status: INITIAL  LONG TERM GOALS: Target date: ***  *** Baseline:  Goal status: INITIAL  2.  *** Baseline:  Goal status: INITIAL  3.  *** Baseline:  Goal status: INITIAL  4.  *** Baseline:  Goal status: INITIAL  5.  *** Baseline:  Goal status: INITIAL  6.  *** Baseline:  Goal status: INITIAL  PLAN:  PT FREQUENCY: {rehab frequency:25116}  PT DURATION: {rehab duration:25117}  PLANNED INTERVENTIONS: {rehab planned interventions:25118::"97110-Therapeutic exercises","97530- Therapeutic [phone]- Neuromuscular re-education","97535- Self JXBJ","47829-  Manual therapy"}  PLAN FOR  NEXT SESSION: ***   Katherleen Folkes, PT 01/04/2024, 10:01 AM

## 2024-01-05 ENCOUNTER — Ambulatory Visit: Payer: Medicaid Other | Admitting: Family Medicine

## 2024-01-06 ENCOUNTER — Ambulatory Visit: Admitting: Bariatrics

## 2024-01-06 ENCOUNTER — Encounter: Payer: Self-pay | Admitting: Bariatrics

## 2024-01-06 VITALS — BP 110/72 | HR 85 | Temp 97.4°F | Ht 62.5 in | Wt 216.0 lb

## 2024-01-06 DIAGNOSIS — E669 Obesity, unspecified: Secondary | ICD-10-CM | POA: Diagnosis not present

## 2024-01-06 DIAGNOSIS — Z6838 Body mass index (BMI) 38.0-38.9, adult: Secondary | ICD-10-CM

## 2024-01-06 DIAGNOSIS — E559 Vitamin D deficiency, unspecified: Secondary | ICD-10-CM

## 2024-01-06 DIAGNOSIS — R632 Polyphagia: Secondary | ICD-10-CM

## 2024-01-06 MED ORDER — VITAMIN D (ERGOCALCIFEROL) 1.25 MG (50000 UNIT) PO CAPS: 50000.0000 [IU] | ORAL_CAPSULE | ORAL | 0 refills | Status: DC

## 2024-01-06 MED ORDER — WEGOVY 0.5 MG/0.5ML ~~LOC~~ SOAJ: 0.5000 mg | SUBCUTANEOUS | 0 refills | Status: DC

## 2024-01-06 NOTE — Progress Notes (Signed)
 WEIGHT SUMMARY AND BIOMETRICS  Weight Lost Since Last Visit: 0  Weight Gained Since Last Visit: 0   Vitals Temp: (!) 97.4 F (36.3 C) BP: 110/72 Pulse Rate: 85 SpO2: 99 %   Anthropometric Measurements Height: 5' 2.5" (1.588 m) Weight: 216 lb (98 kg) BMI (Calculated): 38.85 Weight at Last Visit: 216lb Weight Lost Since Last Visit: 0 Weight Gained Since Last Visit: 0 Starting Weight: 219lb Total Weight Loss (lbs): 3 lb (1.361 kg)   Body Composition  Body Fat %: 44.3 % Fat Mass (lbs): 95.8 lbs Muscle Mass (lbs): 114.6 lbs Total Body Water (lbs): 81 lbs Visceral Fat Rating : 11   Other Clinical Data Fasting: no Labs: no Today's Visit #: 3 Starting Date: 12/03/23    OBESITY [name] is here to discuss her progress with her obesity treatment plan along with follow-up of her obesity related diagnoses.    Nutrition Plan: the Category 3 plan - 75% adherence.  Current exercise: none  Interim History:  Her weight remains the same.  Eating all of the food on the plan., Protein intake is as prescribed, Is skipping meals, and Water intake is adequate.   Pharmacotherapy: [name] is on Wegovy  0.50 mg SQ weekly Adverse side effects: None Hunger is moderately controlled.  Cravings are moderately controlled.   Vitamin D  Deficiency Vitamin D  is not at goal of 50.  Most recent vitamin D  level was 12.1. She is on  prescription ergocalciferol  50,000 IU weekly. Lab Results  Component Value Date   VD25OH 12.1 (L) 12/03/2023    Plan: Refill prescription vitamin D  50,000 IU weekly.   Polyphagia [name] endorses excessive hunger.  Medication(s): Wegovy   Effects of medication:  moderately controlled. Cravings are moderately controlled.   Plan: Medication(s): Wegovy  0.50 mg SQ weekly Will increase water, protein and fiber to help assuage hunger.  Will  minimize foods that have a high glucose index/load to minimize reactive hypoglycemia.  Will have a shake if she does not eat either her breakfast and or her lunch.  Will try to have 2 normal meals per day, more focus on healthy snacks.  [name] has agreed to follow-up with our clinic in 2 weeks.     Generalized Obesity: Current BMI BMI (Calculated): 38.85    [name] has agreed to follow-up with our clinic in 2 weeks.   pharmacotherapy Plan Continue and refill  Wegovy  0.50 mg SQ weekly  [name] is currently in the action stage of change. As such, her goal is to continue with weight loss efforts.  She has agreed to the Category 3 plan.  Exercise goals: All adults should avoid inactivity. Some physical activity is better than none, and adults who participate in any amount of physical activity gain some health benefits.   Objective:   BP 110/72   Pulse 85   Temp (!) 97.4 F (36.3 C)  Ht 5' 2.5" (1.588 m)   Wt 216 lb (98 kg)   LMP  (LMP Unknown)   SpO2 99%   BMI 38.88 kg/m  She was weighed on the bioimpedance scale: Body mass index is 38.88 kg/m.    DIAGNOSTIC DATA REVIEWED:  BMET    Component Value Date/Time   NA 137 12/03/2023 1017   K 4.8 12/03/2023 1017   CL 99 12/03/2023 1017   CO2 23 12/03/2023 1017   GLUCOSE 89 12/03/2023 1017   GLUCOSE 97 05/29/2023 0927   BUN 12 12/03/2023 1017   CREATININE 0.74 12/03/2023 1017   CALCIUM 9.8 12/03/2023 1017   GFRNONAA >60 05/29/2023 0927   GFRAA >60 05/01/2020 1059   Lab Results  Component Value Date   HGBA1C 5.8 (A) 11/18/2023   HGBA1C 4.7 (L) 06/24/2021   Lab Results  Component Value Date   INSULIN  19.6 12/03/2023   CBC    Component Value Date/Time   WBC 6.9 12/03/2023 1017   WBC 7.6 05/29/2023 0927   RBC 3.99 12/03/2023 1017   RBC 3.68 (L) 05/29/2023 0927   HGB 11.7 12/03/2023 1017   HCT 35.9 12/03/2023 1017   PLT 422 12/03/2023 1017   MCV 90 12/03/2023 1017   MCH 29.3 12/03/2023 1017   MCH 29.1 05/29/2023  0927   MCHC 32.6 12/03/2023 1017   MCHC 33.3 05/29/2023 0927   RDW 12.6 12/03/2023 1017   Iron /TIBC/Ferritin/ %Sat    Component Value Date/Time   IRON  49 12/03/2023 1017   TIBC 326 12/03/2023 1017   FERRITIN 129 12/03/2023 1017   IRONPCTSAT 15 12/03/2023 1017   Lipid Panel     Component Value Date/Time   CHOL 156 11/18/2023 1100   TRIG 178 (H) 11/18/2023 1100   HDL 35 (L) 11/18/2023 1100   LDLCALC 90 11/18/2023 1100   Hepatic Function Panel     Component Value Date/Time   PROT 7.5 12/03/2023 1017   ALBUMIN 4.6 12/03/2023 1017   AST 18 12/03/2023 1017   ALT 19 12/03/2023 1017   ALKPHOS 65 12/03/2023 1017   BILITOT 0.4 12/03/2023 1017      Component Value Date/Time   TSH 0.359 (L) 12/03/2023 1017      Attestation Statements:   This was prepared with the assistance of Engineer, civil (consulting).  Occasional wrong-word or sound-a-like substitutions may have occurred due to the inherent limitations of voice recognition   Kirk Peper, DO

## 2024-01-20 ENCOUNTER — Other Ambulatory Visit: Payer: Self-pay | Admitting: Family Medicine

## 2024-02-10 ENCOUNTER — Ambulatory Visit: Admitting: Bariatrics

## 2024-02-16 ENCOUNTER — Other Ambulatory Visit: Payer: Self-pay | Admitting: Critical Care Medicine

## 2024-02-16 DIAGNOSIS — I10 Essential (primary) hypertension: Secondary | ICD-10-CM

## 2024-02-17 ENCOUNTER — Encounter: Payer: Self-pay | Admitting: Bariatrics

## 2024-02-17 ENCOUNTER — Ambulatory Visit (INDEPENDENT_AMBULATORY_CARE_PROVIDER_SITE_OTHER): Admitting: Bariatrics

## 2024-02-17 VITALS — BP 112/69 | HR 100 | Temp 98.3°F | Ht 62.5 in | Wt 217.0 lb

## 2024-02-17 DIAGNOSIS — E669 Obesity, unspecified: Secondary | ICD-10-CM | POA: Diagnosis not present

## 2024-02-17 DIAGNOSIS — R632 Polyphagia: Secondary | ICD-10-CM | POA: Diagnosis not present

## 2024-02-17 DIAGNOSIS — R7303 Prediabetes: Secondary | ICD-10-CM

## 2024-02-17 DIAGNOSIS — Z6839 Body mass index (BMI) 39.0-39.9, adult: Secondary | ICD-10-CM | POA: Diagnosis not present

## 2024-02-17 MED ORDER — WEGOVY 0.5 MG/0.5ML ~~LOC~~ SOAJ: 0.5000 mg | SUBCUTANEOUS | 0 refills | Status: DC

## 2024-02-17 NOTE — Progress Notes (Signed)
 WEIGHT SUMMARY AND BIOMETRICS  Weight Lost Since Last Visit: 0lb  Weight Gained Since Last Visit: 1lb   Vitals Temp: 98.3 F (36.8 C) BP: 112/69 Pulse Rate: 100 SpO2: 99 %   Anthropometric Measurements Height: 5' 2.5 (1.588 m) Weight: 217 lb (98.4 kg) BMI (Calculated): 39.03 Weight at Last Visit: 216lb Weight Lost Since Last Visit: 0lb Weight Gained Since Last Visit: 1lb Starting Weight: 219lb Total Weight Loss (lbs): 2 lb (0.907 kg)   Body Composition  Body Fat %: 44.9 % Fat Mass (lbs): 97.8 lbs Muscle Mass (lbs): 113.8 lbs Total Body Water (lbs): 85 lbs Visceral Fat Rating : 12   Other Clinical Data Fasting: No Labs: No Today's Visit #: 4 Starting Date: 12/03/23    OBESITY [name] is here to discuss her progress with her obesity treatment plan along with follow-up of her obesity related diagnoses.    Nutrition Plan: the Category 3 plan - 60% adherence.  Current exercise: walking  Interim History:  She is up 1 lb since her last visit.  Eating all of the food on the plan., Protein intake is as prescribed, Is skipping meals, Not journaling consistently., and Water intake is adequate.   Pharmacotherapy: [name] is on Wegovy  0.50 mg SQ weekly Adverse side effects: None Hunger is moderately controlled. She is not always able to eat all of her meals.  Cravings are moderately controlled.  Assessment/Plan:   Polyphagia [name] endorses excessive hunger.  Medication(s): Wegovy  0.5 mg  Effects of medication:  moderately controlled. Cravings are moderately controlled.   Plan: Medication(s): Wegovy  0.50 mg SQ weekly Will increase water, protein and fiber to help assuage hunger.  Will minimize foods that have a high glucose index/load to minimize reactive hypoglycemia.  She will make more meals at home and will at least have some protein if she  does not feel like eating. Will resume journaling on a regular basis.  Prediabetes Last A1c was 5.8  Medication(s):  Wegovy  0.50 mg SQ weekly Lab Results  Component Value Date   HGBA1C 5.8 (A) 11/18/2023   HGBA1C 4.7 (L) 06/24/2021   Lab Results  Component Value Date   INSULIN  19.6 12/03/2023    Plan: Will minimize all refined carbohydrates both sweets and starches.  Will work on the plan and exercise.  Consider both aerobic and resistance training.  Will keep protein, water, and fiber intake high.  She will go back to the gym on a regular basis.  She will continue to be active and walk at work and at home She will not miss meals..  If she does miss a meal she will have a protein shake. She will start journaling on a regular basis.  I reviewed her journaling again with her calories and protein and a journaling sheet outlining breakfast lunch and dinner calories and protein was given.  Continue and refill Wegovy  0.50  mg SQ weekly I will not increase her dose of Wegovy  at this time secondary to her not having much of an appetite and not being able to eat all of her meals.   Generalized Obesity: Current BMI BMI (Calculated): 39.03   Pharmacotherapy Plan Continue and refill  Wegovy  0.50 mg SQ weekly  [name] is currently in the action stage of change. As such, her goal is to continue with weight loss efforts.  She has agreed to the Category 3 plan.  Exercise goals: For substantial health benefits, adults should do at least 150 minutes (2 hours and 30 minutes) a week of moderate-intensity, or 75 minutes (1 hour and 15 minutes) a week of vigorous-intensity aerobic physical activity, or an equivalent combination of moderate- and vigorous-intensity aerobic activity. Aerobic activity should be performed in episodes of at least 10 minutes, and preferably, it should be spread throughout the week. She will get back to the gym several days per week.   Behavioral modification strategies:  increasing lean protein intake, decrease eating out, meal planning , increase water intake, better snacking choices, planning for success, avoiding temptations, keep healthy foods in the home, weigh protein portions, measure portion sizes, work on smaller portions, and mindful eating.  [name] has agreed to follow-up with our clinic in 2 weeks.   Objective:   VITALS: Per patient if applicable, see vitals. GENERAL: Alert and in no acute distress. CARDIOPULMONARY: No increased WOB. Speaking in clear sentences.  PSYCH: Pleasant and cooperative. Speech normal rate and rhythm. Affect is appropriate. Insight and judgement are appropriate. Attention is focused, linear, and appropriate.  NEURO: Oriented as arrived to appointment on time with no prompting.   Attestation Statements:   This was prepared with the assistance of Engineer, civil (consulting).  Occasional wrong-word or sound-a-like substitutions may have occurred due to the inherent limitations of voice recognition    Clayborne Daring, DO

## 2024-03-03 ENCOUNTER — Other Ambulatory Visit: Payer: Self-pay

## 2024-03-03 ENCOUNTER — Ambulatory Visit: Admitting: Nurse Practitioner

## 2024-03-03 ENCOUNTER — Encounter: Payer: Self-pay | Admitting: Nurse Practitioner

## 2024-03-03 ENCOUNTER — Encounter: Payer: Self-pay | Admitting: Family Medicine

## 2024-03-03 VITALS — BP 123/68 | HR 89 | Temp 98.2°F | Ht 62.5 in | Wt 217.0 lb

## 2024-03-03 DIAGNOSIS — K089 Disorder of teeth and supporting structures, unspecified: Secondary | ICD-10-CM

## 2024-03-03 DIAGNOSIS — E66812 Obesity, class 2: Secondary | ICD-10-CM

## 2024-03-03 DIAGNOSIS — Z6839 Body mass index (BMI) 39.0-39.9, adult: Secondary | ICD-10-CM

## 2024-03-03 DIAGNOSIS — R632 Polyphagia: Secondary | ICD-10-CM

## 2024-03-03 MED ORDER — WEGOVY 1 MG/0.5ML ~~LOC~~ SOAJ: 1.0000 mg | SUBCUTANEOUS | 0 refills | Status: DC

## 2024-03-03 NOTE — Progress Notes (Signed)
 Office: [phone]  /  Fax: [phone]  WEIGHT SUMMARY AND BIOMETRICS  Weight Lost Since Last Visit: 0lb  Weight Gained Since Last Visit: 0lb   Vitals Temp: 98.2 F (36.8 C) BP: 123/68 Pulse Rate: 89 SpO2: 99 %   Anthropometric Measurements Height: 5' 2.5 (1.588 m) Weight: 217 lb (98.4 kg) BMI (Calculated): 39.03 Weight at Last Visit: 217lb Weight Lost Since Last Visit: 0lb Weight Gained Since Last Visit: 0lb Starting Weight: 219lb Total Weight Loss (lbs): 2 lb (0.907 kg)   Body Composition  Body Fat %: 44.4 % Fat Mass (lbs): 96.4 lbs Muscle Mass (lbs): 114.6 lbs Total Body Water (lbs): 81.8 lbs Visceral Fat Rating : 11   Other Clinical Data Fasting: No Labs: No Today's Visit #: 5 Starting Date: 12/03/23     HPI  Chief Complaint: OBESITY  [name] is here to discuss her progress with her obesity treatment plan. She is on the the Category 3 Plan and states she is following her eating plan approximately 70-75 % of the time. She states she is exercising 0 minutes 0 days per week.   Interval History:  Since last office visit she has maintained her weight.  She is struggling with polyphagia. She is substituting a protein shake (Boost) for breakfast, lunch:  protein and vegetables and dinner:  protein and vegetable.  She is snacking on fruits. She is drinking diet soda, [employer] milk and water.     Pharmacotherapy for weight loss: She is currently taking Wegovy  0.5mg  for medical weight loss.  Denies side effects.    Wegovy  approved from 12/21/2023 to 06/21/2024.   Previous pharmacotherapy for medical weight loss:  None  Bariatric surgery:  Patient has not had bariatric surgery.    PHYSICAL EXAM:  Blood pressure 123/68, pulse 89, temperature 98.2 F (36.8 C), height 5' 2.5 (1.588 m), weight 217 lb (98.4 kg), SpO2 99%. Body mass index is 39.06 kg/m.  General: She is overweight, cooperative, alert, well developed, and in no acute  distress. PSYCH: Has normal mood, affect and thought process.   Extremities: No edema.  Neurologic: No gross sensory or motor deficits. No tremors or fasciculations noted.    DIAGNOSTIC DATA REVIEWED:  BMET    Component Value Date/Time   NA 137 12/03/2023 1017   K 4.8 12/03/2023 1017   CL 99 12/03/2023 1017   CO2 23 12/03/2023 1017   GLUCOSE 89 12/03/2023 1017   GLUCOSE 97 05/29/2023 0927   BUN 12 12/03/2023 1017   CREATININE 0.74 12/03/2023 1017   CALCIUM 9.8 12/03/2023 1017   GFRNONAA >60 05/29/2023 0927   GFRAA >60 05/01/2020 1059   Lab Results  Component Value Date   HGBA1C 5.8 (A) 11/18/2023   HGBA1C 4.7 (L) 06/24/2021   Lab Results  Component Value Date   INSULIN  19.6 12/03/2023   Lab Results  Component Value Date   TSH 0.359 (L) 12/03/2023   CBC    Component Value Date/Time   WBC 6.9 12/03/2023 1017   WBC 7.6 05/29/2023 0927   RBC 3.99 12/03/2023 1017   RBC 3.68 (L) 05/29/2023 0927   HGB 11.7 12/03/2023 1017   HCT 35.9 12/03/2023 1017   PLT 422 12/03/2023 1017   MCV 90 12/03/2023 1017   MCH 29.3 12/03/2023 1017   MCH 29.1 05/29/2023 0927   MCHC 32.6 12/03/2023 1017   MCHC 33.3 05/29/2023 0927   RDW 12.6 12/03/2023 1017   Iron  Studies    Component Value Date/Time   IRON  49  12/03/2023 1017   TIBC 326 12/03/2023 1017   FERRITIN 129 12/03/2023 1017   IRONPCTSAT 15 12/03/2023 1017   Lipid Panel     Component Value Date/Time   CHOL 156 11/18/2023 1100   TRIG 178 (H) 11/18/2023 1100   HDL 35 (L) 11/18/2023 1100   LDLCALC 90 11/18/2023 1100   Hepatic Function Panel     Component Value Date/Time   PROT 7.5 12/03/2023 1017   ALBUMIN 4.6 12/03/2023 1017   AST 18 12/03/2023 1017   ALT 19 12/03/2023 1017   ALKPHOS 65 12/03/2023 1017   BILITOT 0.4 12/03/2023 1017      Component Value Date/Time   TSH 0.359 (L) 12/03/2023 1017   Nutritional Lab Results  Component Value Date   VD25OH 12.1 (L) 12/03/2023     ASSESSMENT AND  PLAN  TREATMENT PLAN FOR OBESITY:  Recommended Dietary Goals  [name] is currently in the action stage of change. As such, her goal is to continue weight management plan. She has agreed to keeping a food journal and adhering to recommended goals of 1500-1600 calories and 100+ grams protein.  App:  [employer] Intervention  We discussed the following Behavioral Modification Strategies today: increasing lean protein intake to established goals, decreasing simple carbohydrates , increasing vegetables, increasing fiber rich foods, increasing water intake , work on meal planning and preparation, work on Counselling psychologist calories using tracking application, reading food labels , keeping healthy foods at home, and continue to work on maintaining a reduced calorie state, getting the recommended amount of protein, incorporating whole foods, making healthy choices, staying well hydrated and practicing mindfulness when eating..  Additional resources provided today: protein shakes, smart fruit choices, protein content of foods  Recommended Physical Activity Goals  [name] has been advised to work up to 150 minutes of moderate intensity aerobic activity a week and strengthening exercises 2-3 times per week for cardiovascular health, weight loss maintenance and preservation of muscle mass.   She has agreed to Think about enjoyable ways to increase daily physical activity and overcoming barriers to exercise, Increase physical activity in their day and reduce sedentary time (increase NEAT)., and Work on scheduling and tracking physical activity.    Pharmacotherapy We discussed various medication options to help [name] with her weight loss efforts and we both agreed to finish Wegovy  0.5mg  x 2 more weeks and then increase to 1mg .  Side effects discussed.   ASSOCIATED CONDITIONS ADDRESSED TODAY  Action/Plan  Polyphagia -     Wegovy ; Inject 1 mg into the skin once a week.  Dispense: 2 mL;  Refill: 0  Obesity, Class II, BMI 35-39.9 -     Wegovy ; Inject 1 mg into the skin once a week.  Dispense: 2 mL; Refill: 0     I have recommended that she stop her boost protein shakes since it is high in calories, high in carbs and low in protein and have given her several other recommendations for a protein shake with lower calories, lower carbs and higher protein.    Return in about 2 weeks (around 03/17/2024).SABRA She was informed of the importance of frequent follow up visits to maximize her success with intensive lifestyle modifications for her multiple health conditions.   ATTESTASTION STATEMENTS:  Reviewed by clinician on day of visit: allergies, medications, problem list, medical history, surgical history, family history, social history, and previous encounter notes.    [name]. Anna Sakamoto FNP-C

## 2024-03-21 ENCOUNTER — Telehealth: Payer: Self-pay | Admitting: Bariatrics

## 2024-03-21 ENCOUNTER — Encounter: Payer: Self-pay | Admitting: Bariatrics

## 2024-03-21 ENCOUNTER — Ambulatory Visit (INDEPENDENT_AMBULATORY_CARE_PROVIDER_SITE_OTHER): Admitting: Bariatrics

## 2024-03-21 VITALS — BP 115/74 | HR 94 | Temp 98.6°F | Ht 62.5 in | Wt 210.0 lb

## 2024-03-21 DIAGNOSIS — R632 Polyphagia: Secondary | ICD-10-CM

## 2024-03-21 DIAGNOSIS — E559 Vitamin D deficiency, unspecified: Secondary | ICD-10-CM

## 2024-03-21 DIAGNOSIS — Z6837 Body mass index (BMI) 37.0-37.9, adult: Secondary | ICD-10-CM

## 2024-03-21 DIAGNOSIS — E669 Obesity, unspecified: Secondary | ICD-10-CM

## 2024-03-21 MED ORDER — WEGOVY 0.5 MG/0.5ML ~~LOC~~ SOAJ: 0.5000 mg | SUBCUTANEOUS | 0 refills | Status: DC

## 2024-03-21 MED ORDER — VITAMIN D (ERGOCALCIFEROL) 1.25 MG (50000 UNIT) PO CAPS: 50000.0000 [IU] | ORAL_CAPSULE | ORAL | 0 refills | Status: DC

## 2024-03-21 NOTE — Telephone Encounter (Signed)
 Pharmacy is calling because patient has 2 RX Wagovy different dosage. Please advise what strength pt suppose to be taking.

## 2024-03-21 NOTE — Progress Notes (Signed)
 WEIGHT SUMMARY AND BIOMETRICS  Weight Lost Since Last Visit: 7lb  Weight Gained Since Last Visit: 0   Vitals Temp: 98.6 F (37 C) BP: 115/74 Pulse Rate: 94 SpO2: 99 %   Anthropometric Measurements Height: 5' 2.5 (1.588 m) Weight: 210 lb (95.3 kg) BMI (Calculated): 37.77 Weight at Last Visit: 217lb Weight Lost Since Last Visit: 7lb Weight Gained Since Last Visit: 0 Starting Weight: 219lb Total Weight Loss (lbs): 9 lb (4.082 kg)   Body Composition  Body Fat %: 42.8 % Fat Mass (lbs): 90 lbs Muscle Mass (lbs): 114.4 lbs Total Body Water (lbs): 78 lbs Visceral Fat Rating : 11   Other Clinical Data Fasting: no Labs: no Today's Visit #: 6 Starting Date: 12/03/23    OBESITY [name] is here to discuss her progress with her obesity treatment plan along with follow-up of her obesity related diagnoses.    Nutrition Plan: the Category 3 plan - 90% adherence.  Current exercise: walking, she is walking more at work.   Interim History:  She is down 7 lbs since her last visit.  Eating all of the food on the plan., Protein intake is as prescribed, Is not skipping meals, Not journaling consistently., and Water intake is adequate.   Pharmacotherapy: [name] is on Wegovy  0.50 mg SQ weekly Adverse side effects: None Hunger is moderately controlled.  Cravings are moderately controlled.  Assessment/Plan:   Polyphagia [name] endorses excessive hunger.  Medication(s): Wegovy  Effects of medication (appetite):  moderately controlled. Cravings are moderately controlled.   Plan: Medication(s): Wegovy  0.50 mg SQ weekly Will increase water, protein and fiber to help assuage hunger.  Will minimize foods that have a high glucose index/load to minimize reactive hypoglycemia.  Will begin to journal calories and protein daily.   Vitamin D  Deficiency Vitamin D  is not  at goal of 50.  Most recent vitamin D  level was 12.1. She is on  prescription ergocalciferol  50,000 IU weekly. Lab Results  Component Value Date   VD25OH 12.1 (L) 12/03/2023    Plan: Refill prescription vitamin D  50,000 IU weekly.  She will get some mild sun exposure.     Generalized Obesity: Current BMI BMI (Calculated): 37.77   Pharmacotherapy Plan Continue and refill  Wegovy  0.50 mg SQ weekly  [name] is currently in the action stage of change. As such, her goal is to continue with weight loss efforts.  She has agreed to the Category 3 plan.  Exercise goals: All adults should avoid inactivity. Some physical activity is better than none, and adults who participate in any amount of physical activity gain some health benefits.  Behavioral modification strategies: increasing lean protein intake, decreasing simple carbohydrates , no meal skipping, decrease eating out, meal planning , increase water intake, better snacking choices, planning for success, increasing vegetables, get rid of junk food in the home, decrease snacking , keep healthy foods  in the home, weigh protein portions, measure portion sizes, work on smaller portions, and mindful eating.  [name] has agreed to follow-up with our clinic in 4 weeks.       Objective:   VITALS: Per patient if applicable, see vitals. GENERAL: Alert and in no acute distress. CARDIOPULMONARY: No increased WOB. Speaking in clear sentences.  PSYCH: Pleasant and cooperative. Speech normal rate and rhythm. Affect is appropriate. Insight and judgement are appropriate. Attention is focused, linear, and appropriate.  NEURO: Oriented as arrived to appointment on time with no prompting.   Attestation Statements:   This was prepared with the assistance of Engineer, civil (consulting).  Occasional wrong-word or sound-a-like substitutions may have occurred due to the inherent limitations of voice recognition   Clayborne Daring, DO

## 2024-03-22 ENCOUNTER — Telehealth (INDEPENDENT_AMBULATORY_CARE_PROVIDER_SITE_OTHER): Payer: Self-pay

## 2024-03-22 NOTE — Telephone Encounter (Signed)
 Pharmacy called wanting clarification on the patients prescription for Wegovy , I told them it should be 0.5mg  weekly. The pharmacist told me they had already dispensed it at 1MG , they said that's what the patient said it was supposed to be. The pharmacist stated that they already had the 0.5MG  ready for the patient but she said it was 1MG  so they changed it and filled it. According to the notes from her lat visit with Dr Delores, on 03/21/24, it is suppose to be for Wegovy  0.5mg .

## 2024-04-06 ENCOUNTER — Ambulatory Visit: Payer: Self-pay

## 2024-04-06 NOTE — Telephone Encounter (Signed)
 Noted

## 2024-04-06 NOTE — Telephone Encounter (Signed)
 FYI Only or Action Required?: Action required by provider: request for appointment.  Patient was last seen in primary care on 03/21/2024 by Delores Shields A, DO.  Called Nurse Triage reporting Shoulder Pain.  Symptoms began several weeks ago.  Interventions attempted: Prescription medications: allopurinol .  Symptoms are: gradually worsening.  Triage Disposition: Mobile Bus Patient/caregiver understands and will follow disposition?: advised Mobile Bus/UC since pt cannot do an appt after 3 pm.         Copied from CRM #[id number]. Topic: Clinical - Red Word Triage >> Apr 06, 2024  9:19 AM Willma SAUNDERS wrote: Red Word that prompted transfer to Nurse Triage: Patient states has been having flare ups from her autoimmune. When started taking allopurinol  (ZYLOPRIM ) 300 MG tablet the flare ups weren't as often but now seems like it is occurring more often. Has pain in her shoulder and swelling in her fingers and toes. Reason for Disposition  [1] SEVERE pain (e.g., excruciating) AND [2] not improved 2 hours after pain medicine  Answer Assessment - Initial Assessment Questions 1. ONSET: When did the pain start?     chronic 2. LOCATION: Where is the pain located?     Let shoulder  3. PAIN: How bad is the pain? (Scale 0-10; or none, mild, moderate, severe)     Severe- comes and goes  4. WORK OR EXERCISE: Has there been any recent work or exercise that involved this part of the body?     no 5. CAUSE: What do you think is causing the arm pain?     Gout flare 6. OTHER SYMPTOMS: Do you have any other symptoms? (e.g., neck pain, swelling, rash, fever, numbness, weakness)     shoulder pain, swelling to fingers and toes, numbness with prolonged exertion  Protocols used: Arm Pain-A-AH

## 2024-04-20 ENCOUNTER — Ambulatory Visit (INDEPENDENT_AMBULATORY_CARE_PROVIDER_SITE_OTHER): Admitting: Bariatrics

## 2024-04-20 ENCOUNTER — Encounter: Payer: Self-pay | Admitting: Bariatrics

## 2024-04-20 VITALS — BP 117/73 | HR 91 | Temp 97.9°F | Ht 62.5 in | Wt 214.0 lb

## 2024-04-20 DIAGNOSIS — E66812 Morbid (severe) obesity due to excess calories: Secondary | ICD-10-CM

## 2024-04-20 DIAGNOSIS — E559 Vitamin D deficiency, unspecified: Secondary | ICD-10-CM

## 2024-04-20 DIAGNOSIS — R632 Polyphagia: Secondary | ICD-10-CM

## 2024-04-20 DIAGNOSIS — E669 Obesity, unspecified: Secondary | ICD-10-CM | POA: Diagnosis not present

## 2024-04-20 DIAGNOSIS — Z6838 Body mass index (BMI) 38.0-38.9, adult: Secondary | ICD-10-CM

## 2024-04-20 MED ORDER — ONDANSETRON HCL 4 MG PO TABS: 4.0000 mg | ORAL_TABLET | Freq: Three times a day (TID) | ORAL | 0 refills | Status: AC | PRN

## 2024-04-20 MED ORDER — SEMAGLUTIDE-WEIGHT MANAGEMENT 1.7 MG/0.75ML ~~LOC~~ SOAJ: 1.7000 mg | Freq: Once | SUBCUTANEOUS | Status: DC

## 2024-04-20 MED ORDER — VITAMIN D (ERGOCALCIFEROL) 1.25 MG (50000 UNIT) PO CAPS: 50000.0000 [IU] | ORAL_CAPSULE | ORAL | 0 refills | Status: AC

## 2024-04-20 NOTE — Progress Notes (Signed)
 WEIGHT SUMMARY AND BIOMETRICS  Weight Lost Since Last Visit: 0  Weight Gained Since Last Visit: 4lb   Vitals Temp: 97.9 F (36.6 C) BP: 117/73 Pulse Rate: 91 SpO2: 97 %   Anthropometric Measurements Height: 5' 2.5 (1.588 m) Weight: 214 lb (97.1 kg) BMI (Calculated): 38.49 Weight at Last Visit: 210lb Weight Lost Since Last Visit: 0 Weight Gained Since Last Visit: 4lb Starting Weight: 219lb Total Weight Loss (lbs): 5 lb (2.268 kg)   Body Composition  Body Fat %: 44.4 % Fat Mass (lbs): 95.2 lbs Muscle Mass (lbs): 113 lbs Total Body Water (lbs): 84 lbs Visceral Fat Rating : 11   Other Clinical Data Fasting: no Labs: no Today's Visit #: 7 Starting Date: 12/03/23    OBESITY Nyleah is here to discuss her progress with her obesity treatment plan along with follow-up of her obesity related diagnoses.    Nutrition Plan: the Category 3 plan - 75% adherence.  Current exercise: walking  Interim History:  She is up 4 lbs since her last visit.  Eating all of the food on the plan., Protein intake is as prescribed, Is skipping meals, Water intake is adequate., and Denies excessive cravings.   Pharmacotherapy: Shahrzad is on Wegovy  1.0 mg per patient. She states that she still has a strong appetite.  Adverse side effects: None Hunger is moderately controlled.  Cravings are moderately controlled.  Assessment/Plan:   Vitamin D  Deficiency Vitamin D  is not at goal of 50.  Most recent vitamin D  level was 12.1. She is on  prescription ergocalciferol  50,000 IU weekly. Lab Results  Component Value Date   VD25OH 12.1 (L) 12/03/2023    Plan: Refill prescription vitamin D  50,000 IU weekly.   Polyphagia Io endorses excessive hunger.  Medication(s): Effects of medication:  moderately controlled. Cravings are moderately controlled.   Plan: Medication(s):  Wegovy  1.7 SQ weekly Will increase water, protein and fiber to help assuage hunger.  Will minimize foods that have a high glucose index/load to minimize reactive hypoglycemia.     Generalized Obesity: Current BMI BMI (Calculated): 38.49   Pharmacotherapy Plan Continue and refill  Wegovy  1.7 SQ weekly Rx: Zofran  4 mg 1 every 8 hrs PRN. # 30 with no refills.   Laiah is currently in the action stage of change. As such, her goal is to continue with weight loss efforts.  She has agreed to the Category 3 plan.  Exercise goals: All adults should avoid inactivity. Some physical activity is better than none, and adults who participate in any amount of physical activity gain some health benefits.  Behavioral modification strategies: increasing lean protein intake, no meal skipping, meal planning , increase water intake, better snacking choices, planning for success, increasing fiber rich foods, dealing with family or coworker sabotage, decrease junk food, decrease snacking , keep healthy foods in the home, increase frequency of journaling, measure  portion sizes, work on smaller portions, and mindful eating.  Kearra has agreed to follow-up with our clinic in 4 weeks and do fasting labs.   No orders of the defined types were placed in this encounter.   Medications Discontinued During This Encounter  Medication Reason   benzonatate  (TESSALON ) 100 MG capsule Patient Preference     No orders of the defined types were placed in this encounter.     Objective:   VITALS: Per patient if applicable, see vitals. GENERAL: Alert and in no acute distress. CARDIOPULMONARY: No increased WOB. Speaking in clear sentences.  PSYCH: Pleasant and cooperative. Speech normal rate and rhythm. Affect is appropriate. Insight and judgement are appropriate. Attention is focused, linear, and appropriate.  NEURO: Oriented as arrived to appointment on time with no prompting.   Attestation Statements:   This was  prepared with the assistance of Engineer, civil (consulting).  Occasional wrong-word or sound-a-like substitutions may have occurred due to the inherent limitations of voice recognition   Clayborne Daring, DO

## 2024-05-02 ENCOUNTER — Telehealth: Payer: Self-pay | Admitting: Bariatrics

## 2024-05-02 ENCOUNTER — Other Ambulatory Visit: Payer: Self-pay | Admitting: Nurse Practitioner

## 2024-05-02 DIAGNOSIS — E66812 Obesity, class 2: Secondary | ICD-10-CM

## 2024-05-02 MED ORDER — WEGOVY 1.7 MG/0.75ML ~~LOC~~ SOAJ: 1.7000 mg | SUBCUTANEOUS | 0 refills | Status: AC

## 2024-05-02 NOTE — Telephone Encounter (Signed)
 Pt is calling because she never received her RX for Mounjuro 1.7 ml. Please send Rx to [facility] on [address]

## 2024-05-10 ENCOUNTER — Other Ambulatory Visit (HOSPITAL_COMMUNITY)
Admission: RE | Admit: 2024-05-10 | Discharge: 2024-05-10 | Disposition: A | Discharge status: Home / Self Care | Source: Ambulatory Visit | Attending: Family Medicine | Admitting: Family Medicine

## 2024-05-10 ENCOUNTER — Encounter: Payer: Self-pay | Admitting: Family Medicine

## 2024-05-10 ENCOUNTER — Ambulatory Visit: Attending: Family Medicine | Admitting: Family Medicine

## 2024-05-10 VITALS — BP 112/74 | HR 85 | Ht 62.5 in | Wt 215.4 lb

## 2024-05-10 DIAGNOSIS — Z113 Encounter for screening for infections with a predominantly sexual mode of transmission: Secondary | ICD-10-CM | POA: Insufficient documentation

## 2024-05-10 DIAGNOSIS — Z87891 Personal history of nicotine dependence: Secondary | ICD-10-CM | POA: Diagnosis not present

## 2024-05-10 DIAGNOSIS — I10 Essential (primary) hypertension: Secondary | ICD-10-CM | POA: Diagnosis not present

## 2024-05-10 DIAGNOSIS — K5909 Other constipation: Secondary | ICD-10-CM

## 2024-05-10 DIAGNOSIS — M25511 Pain in right shoulder: Secondary | ICD-10-CM | POA: Diagnosis not present

## 2024-05-10 DIAGNOSIS — R768 Other specified abnormal immunological findings in serum: Secondary | ICD-10-CM | POA: Diagnosis not present

## 2024-05-10 DIAGNOSIS — M1A00X0 Idiopathic chronic gout, unspecified site, without tophus (tophi): Secondary | ICD-10-CM | POA: Diagnosis not present

## 2024-05-10 DIAGNOSIS — G8929 Other chronic pain: Secondary | ICD-10-CM

## 2024-05-10 DIAGNOSIS — M109 Gout, unspecified: Secondary | ICD-10-CM

## 2024-05-10 MED ORDER — LOSARTAN POTASSIUM-HCTZ 100-25 MG PO TABS: 1.0000 | ORAL_TABLET | Freq: Every day | ORAL | 1 refills | Status: DC

## 2024-05-10 MED ORDER — ALLOPURINOL 300 MG PO TABS: 300.0000 mg | ORAL_TABLET | Freq: Every day | ORAL | 1 refills | Status: AC

## 2024-05-10 MED ORDER — MECLIZINE HCL 25 MG PO TABS: 25.0000 mg | ORAL_TABLET | Freq: Three times a day (TID) | ORAL | 1 refills | Status: AC | PRN

## 2024-05-10 MED ORDER — LOSARTAN POTASSIUM-HCTZ 100-12.5 MG PO TABS: 1.0000 | ORAL_TABLET | Freq: Every day | ORAL | 1 refills | Status: AC

## 2024-05-10 MED ORDER — LUBIPROSTONE 8 MCG PO CAPS: 8.0000 ug | ORAL_CAPSULE | Freq: Two times a day (BID) | ORAL | 1 refills | Status: AC

## 2024-05-10 NOTE — Patient Instructions (Signed)
 VISIT SUMMARY:  Today, you came in for STD testing and to discuss your right shoulder pain. We also reviewed your gout, hypertension, constipation, and vertigo.  YOUR PLAN:  -RIGHT SHOULDER PAIN: You have been experiencing chronic severe pain in your right shoulder. We will order an x-ray to get a better look at what might be causing the pain and refer you to physical therapy. Please follow up after completing physical therapy so we can assess its effectiveness.  -GOUT WITH POLYARTICULAR FLARES: Gout is a type of arthritis that causes painful swelling in the joints. Your recent flare-ups are likely due to not taking allopurinol  for the past three weeks. We will refill your allopurinol  prescription and refer you to another rheumatologist for further evaluation.  -HYPERTENSION: Hypertension is high blood pressure. We will switch your medication to losartan -hydrochlorothiazide  100-12.5 mg to better manage your blood pressure and reduce the risk of gout flare-ups. Please monitor your blood pressure at home and let us  know if you feel lightheaded, in which case you can split the dose. We will also discuss how hydrochlorothiazide  might affect your gout.  -SEXUALLY TRANSMITTED INFECTION SCREENING: You requested STD testing to ensure your health status is current. We will perform a swab test for chlamydia, trichomonas, bacterial vaginosis, and yeast infection, and a blood test for syphilis.  -CONSTIPATION: Constipation is difficulty in passing stools. We will refill your lubiprostone  prescription to help manage this condition.  -VERTIGO: Vertigo is a sensation of spinning or dizziness. We will refill your meclizine  prescription to help manage this condition.  INSTRUCTIONS:  Please follow up after completing physical therapy for your shoulder pain. Monitor your blood pressure at home and report any lightheadedness. Follow up with the new rheumatologist for your gout. We will contact you with the results  of your STD tests.

## 2024-05-10 NOTE — Progress Notes (Signed)
 Subjective:  Patient ID: [name], female    DOB: Jan 28, 1986  Age: 38 y.o. MRN: [id number]  CC: Shoulder Pain (STD testing/Discuss BP medication)     Discussed the use of AI scribe software for clinical note transcription with the patient, who gave verbal consent to proceed.  History of Present Illness [name] is a 38 year old female with a history of hypertension, gout, positive ANA with speckled pattern who presents for STD testing and evaluation of right shoulder pain.  She seeks STD testing for chlamydia, trichomonas, bacterial vaginosis, yeast infection, and syphilis. There have been no recent unprotected sexual encounters, but she wants to ensure her health status is current.  She experiences persistent right shoulder pain since March of this previous year, located under her right arm and extending to the top of the shoulder. Physical therapy referral was placed but she never got an appointment. Home exercise is attempted but causes significant pain when lifting her arm.  She has flare-ups with swelling and redness in her fingers, feet, and arms, which are painful and warm. She has been without allopurinol  for about three weeks due to refill issues. While on allopurinol , swelling was primarily in her feet, but since stopping, her hands are also affected. She maintains a healthy diet, avoiding red meat, seafood, and wine.  Her labs had revealed positive ANA with speckled pattern for which I had referred her to rheumatology.  She had a visit with [facility] and no rheumatologic condition identified and she sought a second opinion with [facility] but missed her appointment and subsequent referral declined.  She manages hypertension with lisinopril  and is concerned about potential allergic reactions, although she has not experienced any. Norvasc  was ineffective in the past. Ankle swelling is managed with hydrochlorothiazide  in her current antihypertensive.    Past Medical  History:  Diagnosis Date   Anemia    Chronic hypertension during pregnancy, antepartum 12/24/2017   [x]  Aspirin  81 mg daily after 12 weeks; discontinue after 36 weeks Current antihypertensives:  None   Baseline and surveillance labs (pulled in from [facility], refresh links as needed)  Lab Results Component Value Date  PLT 353 11/25/2016  CREATININE 0.50 11/24/2017  AST 24 11/25/2016  ALT 17 11/25/2016  PROTCRRATIO 0.30 (H) 12/31/2015   Antenatal Testing CHTN - O10.919  Group I  BP < 140/90, no preecl   Diabetes (HCC)    Edema    High cholesterol    Hypertension    IBS (irritable bowel syndrome)    Joint pain    Pregnancy induced hypertension     Past Surgical History:  Procedure Laterality Date   TUBAL LIGATION Bilateral 07/20/2018   Procedure: POST PARTUM TUBAL LIGATION;  Surgeon: Barbra Lang PARAS, DO;  [facility]: [facility];  Service: Gynecology;  Laterality: Bilateral;    Family History  Problem Relation Age of Onset   Hyperlipidemia Mother    Diabetes Mother    Stroke Mother    Hypertension Mother    Hypertension Father     Social History   Socioeconomic History   Marital status: Single    Spouse name: Not on file   Number of children: Not on file   Years of education: Not on file   Highest education level: Not on file  Occupational History   Not on file  Tobacco Use   Smoking status: Former    Current packs/day: 0.00    Types: Cigarettes    Quit date: 03/29/2013  Years since quitting: 11.1   Smokeless tobacco: Never  Vaping Use   Vaping status: Never Used  Substance and Sexual Activity   Alcohol use: Yes    Comment: occ   Drug use: No   Sexual activity: Yes    Birth control/protection: Condom  Other Topics Concern   Not on file  Social History Narrative   Not on file   Social Drivers of Health   Financial Resource Strain: Low Risk  (11/18/2023)   Overall Financial Resource Strain (CARDIA)    Difficulty of Paying Living Expenses: Not hard at all   Food Insecurity: No Food Insecurity (11/18/2023)   Hunger Vital Sign    Worried About Running Out of Food in the Last Year: Never true    Ran Out of Food in the Last Year: Never true  Transportation Needs: No Transportation Needs (11/18/2023)   PRAPARE - Administrator, Civil Service (Medical): No    Lack of Transportation (Non-Medical): No  Physical Activity: Inactive (11/18/2023)   Exercise Vital Sign    Days of Exercise per Week: 0 days    Minutes of Exercise per Session: 0 min  Stress: No Stress Concern Present (11/18/2023)   [employer] of Occupational Health - Occupational Stress Questionnaire    Feeling of Stress : Not at all  Social Connections: Socially Isolated (11/18/2023)   Social Connection and Isolation Panel    Frequency of Communication with Friends and Family: Three times a week    Frequency of Social Gatherings with Friends and Family: Once a week    Attends Religious Services: Never    Database administrator or Organizations: No    Attends Engineer, structural: Never    Marital Status: Never married    No Known Allergies  Outpatient Medications Prior to Visit  Medication Sig Dispense Refill   cetirizine  (ZYRTEC ) 10 MG tablet TAKE 1 TABLET(10 MG) BY MOUTH DAILY 90 tablet 1   ferrous gluconate  (FERGON) 324 MG tablet Take 1 tablet (324 mg total) by mouth 2 (two) times daily with a meal. 60 tablet 3   fluticasone  (FLONASE ) 50 MCG/ACT nasal spray Place 2 sprays into both nostrils daily. 16 g 6   semaglutide -weight management (WEGOVY ) 1.7 MG/0.75ML SOAJ SQ injection Inject 1.7 mg into the skin once a week. 3 mL 0   Vitamin D , Ergocalciferol , (DRISDOL ) 1.25 MG (50000 UNIT) CAPS capsule Take 1 capsule (50,000 Units total) by mouth every 7 (seven) days. 5 capsule 0   allopurinol  (ZYLOPRIM ) 300 MG tablet Take 1 tablet (300 mg total) by mouth daily. 90 tablet 1   furosemide  (LASIX ) 20 MG tablet TAKE 1 TABLET(20 MG) BY MOUTH DAILY 5 tablet 0    lisinopril -hydrochlorothiazide  (ZESTORETIC ) 20-12.5 MG tablet TAKE 1 TABLET BY MOUTH DAILY 90 tablet 1   ondansetron  (ZOFRAN ) 4 MG tablet Take 1 tablet (4 mg total) by mouth every 8 (eight) hours as needed. (Patient not taking: Reported on 05/10/2024) 30 tablet 0   lubiprostone  (AMITIZA ) 8 MCG capsule Take 1 capsule (8 mcg total) by mouth 2 (two) times daily with a meal. (Patient not taking: Reported on 05/10/2024) 60 capsule 3   meclizine  (ANTIVERT ) 25 MG tablet Take 1 tablet (25 mg total) by mouth 3 (three) times daily as needed for dizziness. (Patient not taking: Reported on 05/10/2024) 60 tablet 1   meloxicam  (MOBIC ) 7.5 MG tablet Take 1 tablet (7.5 mg total) by mouth daily. (Patient not taking: Reported on 05/10/2024) 30 tablet 1  nystatin  cream (MYCOSTATIN ) Apply to affected area twice a day (Patient not taking: Reported on 05/10/2024) 30 g 2   predniSONE  (DELTASONE ) 20 MG tablet Take 1 tablet (20 mg total) by mouth daily with breakfast. (Patient not taking: Reported on 05/10/2024) 5 tablet 0   No facility-administered medications prior to visit.     ROS Review of Systems  Constitutional:  Negative for activity change and appetite change.  HENT:  Negative for sinus pressure and sore throat.   Respiratory:  Negative for chest tightness, shortness of breath and wheezing.   Cardiovascular:  Negative for chest pain and palpitations.  Gastrointestinal:  Negative for abdominal distention, abdominal pain and constipation.  Genitourinary: Negative.   Musculoskeletal:        See HPI  Psychiatric/Behavioral:  Negative for behavioral problems and dysphoric mood.     Objective:  BP 112/74   Pulse 85   Ht 5' 2.5 (1.588 m)   Wt 215 lb 6.4 oz (97.7 kg)   SpO2 96%   BMI 38.77 kg/m      05/10/2024    4:17 PM 04/20/2024    4:00 PM 03/21/2024    4:00 PM  BP/Weight  Systolic BP 112 117 115  Diastolic BP 74 73 74  Wt. (Lbs) 215.4 214 210  BMI 38.77 kg/m2 38.52 kg/m2 37.8 kg/m2      Physical  Exam Constitutional:      Appearance: She is well-developed.  Cardiovascular:     Rate and Rhythm: Normal rate.     Heart sounds: Normal heart sounds. No murmur heard. Pulmonary:     Effort: Pulmonary effort is normal.     Breath sounds: Normal breath sounds. No wheezing or rales.  Chest:     Chest wall: No tenderness.  Abdominal:     General: Bowel sounds are normal. There is no distension.     Palpations: Abdomen is soft. There is no mass.     Tenderness: There is no abdominal tenderness.  Musculoskeletal:        General: No swelling.     Right lower leg: No edema.     Left lower leg: No edema.     Comments: Tenderness in superior and anterior right shoulder joint on palpation and on extreme abduction Normal left shoulder Normal handgrip bilaterally Negative Hawkin and Neer signs  Neurological:     Mental Status: She is alert and oriented to person, place, and time.  Psychiatric:        Mood and Affect: Mood normal.        Latest Ref Rng & Units 12/03/2023   10:17 AM 05/29/2023    9:27 AM 09/30/2022    3:37 PM  CMP  Glucose 70 - 99 mg/dL 89  97  88   BUN 6 - 20 mg/dL 12  10  16    Creatinine 0.57 - 1.00 mg/dL 9.25  9.30  9.28   Sodium 134 - 144 mmol/L 137  137  138   Potassium 3.5 - 5.2 mmol/L 4.8  3.5  4.6   Chloride 96 - 106 mmol/L 99  101  101   CO2 20 - 29 mmol/L 23  22  22    Calcium 8.7 - 10.2 mg/dL 9.8  8.7  9.9   Total Protein 6.0 - 8.5 g/dL 7.5  7.4    Total Bilirubin 0.0 - 1.2 mg/dL 0.4  0.6    Alkaline Phos 44 - 121 IU/L 65  55    AST 0 - 40  IU/L 18  23    ALT 0 - 32 IU/L 19  26      Lipid Panel     Component Value Date/Time   CHOL 156 11/18/2023 1100   TRIG 178 (H) 11/18/2023 1100   HDL 35 (L) 11/18/2023 1100   LDLCALC 90 11/18/2023 1100    CBC    Component Value Date/Time   WBC 6.9 12/03/2023 1017   WBC 7.6 05/29/2023 0927   RBC 3.99 12/03/2023 1017   RBC 3.68 (L) 05/29/2023 0927   HGB 11.7 12/03/2023 1017   HCT 35.9 12/03/2023 1017   PLT  422 12/03/2023 1017   MCV 90 12/03/2023 1017   MCH 29.3 12/03/2023 1017   MCH 29.1 05/29/2023 0927   MCHC 32.6 12/03/2023 1017   MCHC 33.3 05/29/2023 0927   RDW 12.6 12/03/2023 1017   LYMPHSABS 2.5 12/03/2023 1017   MONOABS 0.3 03/04/2021 1531   EOSABS 0.1 12/03/2023 1017   BASOSABS 0.1 12/03/2023 1017    Lab Results  Component Value Date   HGBA1C 5.8 (A) 11/18/2023       Assessment & Plan Right shoulder pain Chronic severe pain, previous physical therapy referral not completed. - Order x-ray of right shoulder. - Refer to physical therapy. - Advise follow-up post-therapy to assess effectiveness.  Gout with polyarticular flares Polyarticular flares due to lack of allopurinol  for three weeks, previous rheumatology consultation inconclusive. - Refill allopurinol  prescription.  Positive ANA with speckled pattern - Refer to another rheumatologist for second opinion  Hypertension Controlled Chronic hypertension, lisinopril  concerns, ineffective amlodipine , hydrochlorothiazide  may exacerbate gout. - Switch to losartan -hydrochlorothiazide  100-12.5 mg. - Monitor blood pressure at home. - Advise dose splitting if lightheaded or if blood pressure is elevated and notify me so we can decrease her dose. - Discuss hydrochlorothiazide 's impact on gout.  Sexually transmitted infection screening Requested STD testing, no symptoms or recent unprotected encounters. - Order STD swab for chlamydia, trichomonas, BV, yeast infection. - Order syphilis blood test.  Constipation - Refill lubiprostone  prescription.  Vertigo - Refill meclizine  prescription.      Meds ordered this encounter  Medications   allopurinol  (ZYLOPRIM ) 300 MG tablet    Sig: Take 1 tablet (300 mg total) by mouth daily.    Dispense:  90 tablet    Refill:  1   DISCONTD: losartan -hydrochlorothiazide  (HYZAAR) 100-25 MG tablet    Sig: Take 1 tablet by mouth daily.    Dispense:  90 tablet    Refill:  1     Discontinue Lisinopril /HCTZ   lubiprostone  (AMITIZA ) 8 MCG capsule    Sig: Take 1 capsule (8 mcg total) by mouth 2 (two) times daily with a meal.    Dispense:  180 capsule    Refill:  1   meclizine  (ANTIVERT ) 25 MG tablet    Sig: Take 1 tablet (25 mg total) by mouth 3 (three) times daily as needed for dizziness.    Dispense:  60 tablet    Refill:  1   losartan -hydrochlorothiazide  (HYZAAR) 100-12.5 MG tablet    Sig: Take 1 tablet by mouth daily.    Dispense:  90 tablet    Refill:  1    Discontinue losartan /HCTZ 100/25 mg    Follow-up: Return in about 6 months (around 11/07/2024) for Chronic medical conditions.       Corrina Sabin, MD, FAAFP. [facility] [place], [place] [phone]   05/10/2024, 4:56 PM

## 2024-05-11 LAB — TREPONEMAL ANTIBODIES, TPPA: Treponemal Antibodies, TPPA: NONREACTIVE

## 2024-05-11 LAB — HIV ANTIBODY (ROUTINE TESTING W REFLEX): HIV Screen 4th Generation wRfx: NONREACTIVE

## 2024-05-11 LAB — RPR W/REFLEX TO TREPSURE: RPR: NONREACTIVE

## 2024-05-12 ENCOUNTER — Ambulatory Visit: Payer: Self-pay | Admitting: Family Medicine

## 2024-05-12 LAB — CERVICOVAGINAL ANCILLARY ONLY
Bacterial Vaginitis (gardnerella): POSITIVE — AB
Candida Glabrata: NEGATIVE
Candida Vaginitis: NEGATIVE
Chlamydia: NEGATIVE
Comment: NEGATIVE
Comment: NEGATIVE
Comment: NEGATIVE
Comment: NEGATIVE
Comment: NEGATIVE
Comment: NORMAL
Neisseria Gonorrhea: NEGATIVE
Trichomonas: NEGATIVE

## 2024-05-13 MED ORDER — METRONIDAZOLE 500 MG PO TABS: 500.0000 mg | ORAL_TABLET | Freq: Two times a day (BID) | ORAL | 0 refills | Status: AC

## 2024-05-18 ENCOUNTER — Ambulatory Visit: Attending: Family Medicine

## 2024-05-18 NOTE — Therapy (Incomplete)
 OUTPATIENT PHYSICAL THERAPY SHOULDER EVALUATION   Patient Name: [name] MRN: [id number] DOB:February 12, 1986, 38 y.o., female Today's Date: 05/18/2024  END OF SESSION:   Past Medical History:  Diagnosis Date   Anemia    Chronic hypertension during pregnancy, antepartum 12/24/2017   [x]  Aspirin  81 mg daily after 12 weeks; discontinue after 36 weeks Current antihypertensives:  None   Baseline and surveillance labs (pulled in from [facility], refresh links as needed)  Lab Results Component Value Date  PLT 353 11/25/2016  CREATININE 0.50 11/24/2017  AST 24 11/25/2016  ALT 17 11/25/2016  PROTCRRATIO 0.30 (H) 12/31/2015   Antenatal Testing CHTN - O10.919  Group I  BP < 140/90, no preecl   Diabetes (HCC)    Edema    High cholesterol    Hypertension    IBS (irritable bowel syndrome)    Joint pain    Pregnancy induced hypertension    Past Surgical History:  Procedure Laterality Date   TUBAL LIGATION Bilateral 07/20/2018   Procedure: POST PARTUM TUBAL LIGATION;  Surgeon: Barbra Lang PARAS, DO;  Location: [facility];  Service: Gynecology;  Laterality: Bilateral;   Patient Active Problem List   Diagnosis Date Noted   Gout 11/19/2023   Administrative encounter 01/01/2023   Severe obesity (BMI >= 40) (HCC) 09/30/2022   Patellar tendinitis of right knee 09/26/2022   Vagina, candidiasis 05/15/2022   BMI 38.0-38.9,adult 06/24/2021   Anemia 03/20/2021   Hypertension     PCP: Delbert Clam, MD  REFERRING PROVIDER: Delbert Clam, MD  REFERRING DIAG: M25.511,G89.29 (ICD-10-CM) - Chronic right shoulder pain   THERAPY DIAG:  No diagnosis found.  Rationale for Evaluation and Treatment: Rehabilitation  ONSET DATE: ***  SUBJECTIVE:                                                                                                                                                                                      SUBJECTIVE STATEMENT: ***  Hand dominance: {MISC; OT HAND  DOMINANCE:[phone]}  PERTINENT HISTORY: ***  PAIN:  Are you having pain?  Yes: NPRS scale: *** Pain location: *** Pain description: *** Aggravating factors: *** Relieving factors: ***  PRECAUTIONS: {Therapy precautions:24002}  RED FLAGS: {PT Red Flags:29287}   WEIGHT BEARING RESTRICTIONS: {Yes ***/No:24003}  FALLS:  Has patient fallen in last 6 months? {fallsyesno:27318}  LIVING ENVIRONMENT: Lives with: {[facility] lives with:25569::lives with their family} Lives in: {Lives in:25570} Stairs: {opstairs:27293} Has following equipment at home: {Assistive devices:23999}  OCCUPATION: ***  PLOF: {PLOF:24004}  PATIENT GOALS:***  NEXT MD VISIT:   OBJECTIVE:  Note: Objective measures were completed at Evaluation unless otherwise noted.  DIAGNOSTIC FINDINGS:  ***  PATIENT  SURVEYS:  {rehab surveys:24030:a}  COGNITION: Overall cognitive status: {cognition:24006}     SENSATION: {sensation:27233}  POSTURE: ***  UPPER EXTREMITY ROM:   {AROM/PROM:27142} ROM Right eval Left eval  Shoulder flexion    Shoulder extension    Shoulder abduction    Shoulder adduction    Shoulder internal rotation    Shoulder external rotation    Elbow flexion    Elbow extension    Wrist flexion    Wrist extension    Wrist ulnar deviation    Wrist radial deviation    Wrist pronation    Wrist supination    (Blank rows = not tested)  UPPER EXTREMITY MMT:  MMT Right eval Left eval  Shoulder flexion    Shoulder extension    Shoulder abduction    Shoulder adduction    Shoulder internal rotation    Shoulder external rotation    Middle trapezius    Lower trapezius    Elbow flexion    Elbow extension    Wrist flexion    Wrist extension    Wrist ulnar deviation    Wrist radial deviation    Wrist pronation    Wrist supination    Grip strength (lbs)    (Blank rows = not tested)  SHOULDER SPECIAL TESTS: Impingement tests: {shoulder impingement test:25231:a} SLAP  lesions: {SLAP lesions:25232} Instability tests: {shoulder instability test:25233} Rotator cuff assessment: {rotator cuff assessment:25234} Biceps assessment: {biceps assessment:25235}  JOINT MOBILITY TESTING:  ***  PALPATION:  ***   TREATMENT: [facility] Adult PT Treatment:                                                DATE: *** Therapeutic Exercise: ***  PATIENT EDUCATION: Education details: eval findings, Quick DASH, HEP, POC Person educated: Patient Education method: Explanation, Demonstration, and Handouts Education comprehension: verbalized understanding and returned demonstration  HOME EXERCISE PROGRAM: ***  ASSESSMENT:  CLINICAL IMPRESSION: Patient is a 38 y.o. F who was seen today for physical therapy evaluation and treatment for ***.   OBJECTIVE IMPAIRMENTS: {opptimpairments:25111}.   ACTIVITY LIMITATIONS: {activitylimitations:27494}  PARTICIPATION LIMITATIONS: {participationrestrictions:25113}  PERSONAL FACTORS: {Personal factors:25162} are also affecting patient's functional outcome.   REHAB POTENTIAL: {rehabpotential:25112}  CLINICAL DECISION MAKING: {clinical decision making:25114}  EVALUATION COMPLEXITY: {Evaluation complexity:25115}   GOALS: Goals reviewed with patient? No  SHORT TERM GOALS: Target date: 06/08/2024   Pt will be compliant and knowledgeable with initial HEP for improved comfort and carryover Baseline: initial HEP given  Goal status: INITIAL  2.  Pt will self report *** pain no greater than ***/10 for improved comfort and functional ability Baseline: ***/10 at worst Goal status: {GOALSTATUS:25110}   LONG TERM GOALS: Target date: 07/13/2024   Pt will decrease Quick DASH disability score to no greater than *** as proxy for functional improvement Baseline: ***  Goal status: INITIAL  2.  Pt will self report *** pain no greater than ***/10 for improved comfort and functional ability Baseline: ***/10 at worst Goal status:  {GOALSTATUS:25110}   3.  *** Baseline:  Goal status: INITIAL  4.  *** Baseline:  Goal status: INITIAL  5.  *** Baseline:  Goal status: INITIAL  6.  *** Baseline:  Goal status: INITIAL  PLAN:  PT FREQUENCY: {rehab frequency:25116}  PT DURATION: {rehab duration:25117}  PLANNED INTERVENTIONS: {rehab planned interventions:25118::97110-Therapeutic exercises,97530- Therapeutic [phone]- Neuromuscular re-education,97535- Self Rjmz,02859- Manual  therapy,Patient/Family education}  PLAN FOR NEXT SESSION: ***   Alm JAYSON Kingdom, PT 05/18/2024, 8:06 AM

## 2024-05-19 ENCOUNTER — Ambulatory Visit: Admitting: Family Medicine

## 2024-05-23 ENCOUNTER — Ambulatory Visit: Admitting: Bariatrics

## 2024-05-30 ENCOUNTER — Ambulatory Visit: Admitting: Bariatrics

## 2024-06-09 ENCOUNTER — Telehealth (INDEPENDENT_AMBULATORY_CARE_PROVIDER_SITE_OTHER): Payer: Self-pay

## 2024-06-09 NOTE — Telephone Encounter (Signed)
 [name] (Key: [id number]) Wegovy  0.25MG /0.5ML auto-injectors

## 2024-11-07 ENCOUNTER — Ambulatory Visit: Admitting: Family Medicine
